# Patient Record
Sex: Male | Born: 1945 | Race: Black or African American | Hispanic: No | Marital: Married | Smoking: Current every day smoker
Health system: Southern US, Community
[De-identification: ages and names within clinical notes are randomized; demographics above are authoritative.]

## PROBLEM LIST (undated history)

## (undated) DIAGNOSIS — H9190 Unspecified hearing loss, unspecified ear: Secondary | ICD-10-CM

## (undated) DIAGNOSIS — N289 Disorder of kidney and ureter, unspecified: Secondary | ICD-10-CM

## (undated) DIAGNOSIS — E785 Hyperlipidemia, unspecified: Secondary | ICD-10-CM

## (undated) DIAGNOSIS — I10 Essential (primary) hypertension: Secondary | ICD-10-CM

## (undated) DIAGNOSIS — E119 Type 2 diabetes mellitus without complications: Secondary | ICD-10-CM

## (undated) DIAGNOSIS — I739 Peripheral vascular disease, unspecified: Secondary | ICD-10-CM

## (undated) HISTORY — DX: Peripheral vascular disease, unspecified: I73.9

## (undated) HISTORY — DX: Hyperlipidemia, unspecified: E78.5

---

## 2009-04-23 ENCOUNTER — Ambulatory Visit (HOSPITAL_COMMUNITY): Admission: RE | Admit: 2009-04-23 | Discharge: 2009-04-23 | Payer: Self-pay | Admitting: Family Medicine

## 2009-06-20 ENCOUNTER — Emergency Department (HOSPITAL_COMMUNITY): Admission: EM | Admit: 2009-06-20 | Discharge: 2009-06-20 | Payer: Self-pay | Admitting: Emergency Medicine

## 2011-01-23 LAB — COMPREHENSIVE METABOLIC PANEL
ALT: 30 U/L (ref 0–53)
Albumin: 3.5 g/dL (ref 3.5–5.2)
Alkaline Phosphatase: 87 U/L (ref 39–117)
Calcium: 9.4 mg/dL (ref 8.4–10.5)
Potassium: 3.9 mEq/L (ref 3.5–5.1)
Sodium: 138 mEq/L (ref 135–145)
Total Protein: 6.7 g/dL (ref 6.0–8.3)

## 2011-01-23 LAB — DIFFERENTIAL
Basophils Relative: 1 % (ref 0–1)
Eosinophils Absolute: 0.1 10*3/uL (ref 0.0–0.7)
Lymphs Abs: 2.3 10*3/uL (ref 0.7–4.0)
Monocytes Absolute: 0.6 10*3/uL (ref 0.1–1.0)
Monocytes Relative: 10 % (ref 3–12)
Neutro Abs: 3.3 10*3/uL (ref 1.7–7.7)

## 2011-01-23 LAB — CBC
MCHC: 36 g/dL (ref 30.0–36.0)
Platelets: 149 10*3/uL — ABNORMAL LOW (ref 150–400)
RDW: 13.6 % (ref 11.5–15.5)

## 2011-02-01 ENCOUNTER — Emergency Department (HOSPITAL_COMMUNITY)
Admission: EM | Admit: 2011-02-01 | Discharge: 2011-02-01 | Disposition: A | Discharge status: Home / Self Care | Payer: Federal, State, Local not specified - PPO | Attending: Emergency Medicine | Admitting: Emergency Medicine

## 2011-02-01 ENCOUNTER — Emergency Department (HOSPITAL_COMMUNITY): Payer: Federal, State, Local not specified - PPO

## 2011-02-01 DIAGNOSIS — R51 Headache: Secondary | ICD-10-CM | POA: Insufficient documentation

## 2011-07-21 ENCOUNTER — Ambulatory Visit (HOSPITAL_COMMUNITY)
Admission: RE | Admit: 2011-07-21 | Discharge: 2011-07-21 | Disposition: A | Discharge status: Home / Self Care | Payer: Federal, State, Local not specified - PPO | Source: Ambulatory Visit | Attending: Family Medicine | Admitting: Family Medicine

## 2011-07-21 ENCOUNTER — Other Ambulatory Visit (HOSPITAL_COMMUNITY): Payer: Self-pay | Admitting: Family Medicine

## 2011-07-21 DIAGNOSIS — M2010 Hallux valgus (acquired), unspecified foot: Secondary | ICD-10-CM | POA: Insufficient documentation

## 2011-07-21 DIAGNOSIS — R52 Pain, unspecified: Secondary | ICD-10-CM

## 2011-07-21 DIAGNOSIS — M7730 Calcaneal spur, unspecified foot: Secondary | ICD-10-CM | POA: Insufficient documentation

## 2011-07-21 DIAGNOSIS — M25579 Pain in unspecified ankle and joints of unspecified foot: Secondary | ICD-10-CM | POA: Insufficient documentation

## 2011-11-05 DIAGNOSIS — K649 Unspecified hemorrhoids: Secondary | ICD-10-CM | POA: Diagnosis not present

## 2011-11-05 DIAGNOSIS — D126 Benign neoplasm of colon, unspecified: Secondary | ICD-10-CM | POA: Diagnosis not present

## 2011-11-05 DIAGNOSIS — Z1211 Encounter for screening for malignant neoplasm of colon: Secondary | ICD-10-CM | POA: Diagnosis not present

## 2011-11-13 DIAGNOSIS — E785 Hyperlipidemia, unspecified: Secondary | ICD-10-CM | POA: Diagnosis not present

## 2011-11-13 DIAGNOSIS — I10 Essential (primary) hypertension: Secondary | ICD-10-CM | POA: Diagnosis not present

## 2012-03-11 DIAGNOSIS — E785 Hyperlipidemia, unspecified: Secondary | ICD-10-CM | POA: Diagnosis not present

## 2012-03-11 DIAGNOSIS — I10 Essential (primary) hypertension: Secondary | ICD-10-CM | POA: Diagnosis not present

## 2012-03-11 DIAGNOSIS — E119 Type 2 diabetes mellitus without complications: Secondary | ICD-10-CM | POA: Diagnosis not present

## 2012-07-13 DIAGNOSIS — Z125 Encounter for screening for malignant neoplasm of prostate: Secondary | ICD-10-CM | POA: Diagnosis not present

## 2012-07-13 DIAGNOSIS — I10 Essential (primary) hypertension: Secondary | ICD-10-CM | POA: Diagnosis not present

## 2012-07-13 DIAGNOSIS — N400 Enlarged prostate without lower urinary tract symptoms: Secondary | ICD-10-CM | POA: Diagnosis not present

## 2012-07-13 DIAGNOSIS — E785 Hyperlipidemia, unspecified: Secondary | ICD-10-CM | POA: Diagnosis not present

## 2012-08-24 DIAGNOSIS — I10 Essential (primary) hypertension: Secondary | ICD-10-CM | POA: Diagnosis not present

## 2012-08-24 DIAGNOSIS — E7800 Pure hypercholesterolemia, unspecified: Secondary | ICD-10-CM | POA: Diagnosis not present

## 2012-12-07 DIAGNOSIS — I10 Essential (primary) hypertension: Secondary | ICD-10-CM | POA: Diagnosis not present

## 2012-12-07 DIAGNOSIS — E785 Hyperlipidemia, unspecified: Secondary | ICD-10-CM | POA: Diagnosis not present

## 2013-02-23 DIAGNOSIS — J3489 Other specified disorders of nose and nasal sinuses: Secondary | ICD-10-CM | POA: Diagnosis not present

## 2013-02-23 DIAGNOSIS — H6590 Unspecified nonsuppurative otitis media, unspecified ear: Secondary | ICD-10-CM | POA: Diagnosis not present

## 2013-02-23 DIAGNOSIS — H908 Mixed conductive and sensorineural hearing loss, unspecified: Secondary | ICD-10-CM | POA: Diagnosis not present

## 2013-03-02 DIAGNOSIS — E7800 Pure hypercholesterolemia, unspecified: Secondary | ICD-10-CM | POA: Diagnosis not present

## 2013-03-02 DIAGNOSIS — H6590 Unspecified nonsuppurative otitis media, unspecified ear: Secondary | ICD-10-CM | POA: Diagnosis not present

## 2013-03-02 DIAGNOSIS — H908 Mixed conductive and sensorineural hearing loss, unspecified: Secondary | ICD-10-CM | POA: Diagnosis not present

## 2013-03-02 DIAGNOSIS — Z823 Family history of stroke: Secondary | ICD-10-CM | POA: Diagnosis not present

## 2013-03-02 DIAGNOSIS — F17200 Nicotine dependence, unspecified, uncomplicated: Secondary | ICD-10-CM | POA: Diagnosis not present

## 2013-03-02 DIAGNOSIS — H6980 Other specified disorders of Eustachian tube, unspecified ear: Secondary | ICD-10-CM | POA: Diagnosis not present

## 2013-03-02 DIAGNOSIS — H6520 Chronic serous otitis media, unspecified ear: Secondary | ICD-10-CM | POA: Diagnosis not present

## 2013-03-02 DIAGNOSIS — J3489 Other specified disorders of nose and nasal sinuses: Secondary | ICD-10-CM | POA: Diagnosis not present

## 2013-03-02 DIAGNOSIS — Z800 Family history of malignant neoplasm of digestive organs: Secondary | ICD-10-CM | POA: Diagnosis not present

## 2013-03-02 DIAGNOSIS — Z79899 Other long term (current) drug therapy: Secondary | ICD-10-CM | POA: Diagnosis not present

## 2013-03-02 DIAGNOSIS — I10 Essential (primary) hypertension: Secondary | ICD-10-CM | POA: Diagnosis not present

## 2013-03-02 DIAGNOSIS — E669 Obesity, unspecified: Secondary | ICD-10-CM | POA: Diagnosis not present

## 2013-03-02 DIAGNOSIS — Z6828 Body mass index (BMI) 28.0-28.9, adult: Secondary | ICD-10-CM | POA: Diagnosis not present

## 2013-03-02 DIAGNOSIS — Z8249 Family history of ischemic heart disease and other diseases of the circulatory system: Secondary | ICD-10-CM | POA: Diagnosis not present

## 2013-04-05 DIAGNOSIS — J3489 Other specified disorders of nose and nasal sinuses: Secondary | ICD-10-CM | POA: Diagnosis not present

## 2013-04-05 DIAGNOSIS — H908 Mixed conductive and sensorineural hearing loss, unspecified: Secondary | ICD-10-CM | POA: Diagnosis not present

## 2013-04-06 DIAGNOSIS — E7800 Pure hypercholesterolemia, unspecified: Secondary | ICD-10-CM | POA: Diagnosis not present

## 2013-04-06 DIAGNOSIS — I10 Essential (primary) hypertension: Secondary | ICD-10-CM | POA: Diagnosis not present

## 2013-07-26 DIAGNOSIS — Z125 Encounter for screening for malignant neoplasm of prostate: Secondary | ICD-10-CM | POA: Diagnosis not present

## 2013-07-26 DIAGNOSIS — Z1212 Encounter for screening for malignant neoplasm of rectum: Secondary | ICD-10-CM | POA: Diagnosis not present

## 2013-07-26 DIAGNOSIS — Z23 Encounter for immunization: Secondary | ICD-10-CM | POA: Diagnosis not present

## 2013-07-26 DIAGNOSIS — I10 Essential (primary) hypertension: Secondary | ICD-10-CM | POA: Diagnosis not present

## 2013-08-02 DIAGNOSIS — L989 Disorder of the skin and subcutaneous tissue, unspecified: Secondary | ICD-10-CM | POA: Diagnosis not present

## 2013-08-02 DIAGNOSIS — L909 Atrophic disorder of skin, unspecified: Secondary | ICD-10-CM | POA: Diagnosis not present

## 2013-08-16 DIAGNOSIS — L909 Atrophic disorder of skin, unspecified: Secondary | ICD-10-CM | POA: Diagnosis not present

## 2013-08-17 ENCOUNTER — Encounter: Payer: Self-pay | Admitting: Podiatry

## 2013-08-17 ENCOUNTER — Ambulatory Visit (INDEPENDENT_AMBULATORY_CARE_PROVIDER_SITE_OTHER): Payer: Medicare Other

## 2013-08-17 ENCOUNTER — Ambulatory Visit (INDEPENDENT_AMBULATORY_CARE_PROVIDER_SITE_OTHER): Payer: Medicare Other | Admitting: Podiatry

## 2013-08-17 VITALS — BP 117/75 | HR 69 | Temp 97.0°F | Resp 16 | Ht >= 80 in | Wt 267.0 lb

## 2013-08-17 DIAGNOSIS — M722 Plantar fascial fibromatosis: Secondary | ICD-10-CM

## 2013-08-17 DIAGNOSIS — R52 Pain, unspecified: Secondary | ICD-10-CM | POA: Diagnosis not present

## 2013-08-17 DIAGNOSIS — M7750 Other enthesopathy of unspecified foot: Secondary | ICD-10-CM | POA: Diagnosis not present

## 2013-08-17 DIAGNOSIS — M778 Other enthesopathies, not elsewhere classified: Secondary | ICD-10-CM

## 2013-08-17 NOTE — Progress Notes (Signed)
N BALLED UP SOCK FEELING  L BOTH FEET BALLS OF FEET , RIGHT IS WORSE  D 5 YEARS  O GRADUAL   C WORSE  A WHEN WALKING BAREFOOT OR WITH SHOES ON  T NO TREATMENT

## 2013-08-17 NOTE — Progress Notes (Signed)
[place] presents today as a 67 year old black male with a chief complaint of a 5 year duration of numbness beneath second metatarsophalangeal joint both feet. 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  Objective: Vital signs are stable he is alert and oriented x3. I have reviewed his past medical history medications and allergies. As well as his review of systems. Bilateral  lower extremity exam reveals strongly palpable pulses bilateral. No loss of neurologic sensorium per Semmes-Weinstein monofilament. Deep tendon reflexes are intact and brisk bilateral. Muscle strength +5 over 5 dorsiflexors plantar flexors inverters and evertors. All intrinsic musculature is intact. 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 Radiographic evaluation confirms this. There are no reactive hyperkeratosis noted. He does have tenderness on palpation of the medial continued tubercles bilateral.  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 This contraction is resulting in plantarflexion of the second metatarsal and reactive inflammation about the second metatarsophalangeal joint.  Plan: We discussed the etiology pathology conservative versus surgical therapies. We injected para-articular only today the second metatarsophalangeal joints with Kenalog and local anesthetic bilaterally. I had him scan for orthotics today. We discussed appropriate shoe gear and ice therapy. Followup with him with his orthotics committed.

## 2013-09-05 ENCOUNTER — Telehealth: Payer: Self-pay | Admitting: *Deleted

## 2013-09-05 NOTE — Telephone Encounter (Signed)
Pt left message with only his name,DOB and [phone] only.

## 2013-09-08 ENCOUNTER — Ambulatory Visit (INDEPENDENT_AMBULATORY_CARE_PROVIDER_SITE_OTHER): Payer: Medicare Other | Admitting: *Deleted

## 2013-09-08 DIAGNOSIS — M722 Plantar fascial fibromatosis: Secondary | ICD-10-CM

## 2013-09-08 NOTE — Patient Instructions (Signed)
If problems with orthotics before scheduled follow up, please call us.    WEARING INSTRUCTIONS FOR ORTHOTICS  Don't expect to be comfortable wearing your orthotic devices for the first time.  Like eyeglasses, you may be aware of them as time passes, they will not be uncomfortable and you will enjoy wearing them.  FOLLOW THESE INSTRUCTIONS EXACTLY!  1. Wear your orthotic devices for:       Not more than 1 hour the first day.       Not more than 2 hours the second day.       Not more than 3 hours the third day and so on.        Or wear them for as long as they feel comfortable.       If you experience discomfort in your feet or legs take them out.  When feet & legs feel       better, put them back in.  You do need to be consistent and wear them a little        everyday. 2.   If at any time the orthotic devices become acutely uncomfortable before the       time for that particular day, STOP WEARING THEM. 3.   On the next day, do not increase the wearing time. 4.   Subsequently, increase the wearing time by 15-30 minutes only if comfortable to do       so. 5.   You will be seen by your doctor about 2-4 weeks after you receive your orthotic       devices, at which time you will probably be wearing your devices comfortably        for about 8 hours or more a day. 6.   Some patients occasionally report mild aches or discomfort in other parts of the of       body such as the knees, hips or back after 3 or 4 consecutive hours of wear.  If this       is the case with you, do not extend your wearing time.  Instead, cut it back an hour or       two.  In all likelihood, these symptoms will disappear in a short period of time as your       body posture realigns itself and functions more efficiently. 7.   It is possible that your orthotic device may require some small changes or adjustment       to improve their function or make them more comfortable.   This is usually not done       before one to  three months have elapsed.  These adjustments are made in        accordance with the changed position your feet are assuming as a result of       improved biomechanical function. 8.   In women's shoes, it's not unusual for your heel to slip out of the shoe, particularly if       they are step-in-shoes.  If this is the case, try other shoes or other styles.  Try to       purchase shoes which have deeper heal seats or higher heel counters. 9.   Squeaking of orthotics devices in the shoes is due to the movement of the devices       when they are functioning normally.  To eliminate squeaking, simply dust some       baby powder into your shoes  before inserting the devices.  If this does not work,        apply soap or wax to the edges of the orthotic devices or put a tissue into the shoes. 10. It is important that you follow these directions explicitly.  Failure to do so will simply       prolong the adjustment period or create problems which are easily avoided.  It makes       no difference if you are wearing your orthotic devices for only a few hours after        several months, so long as you are wearing them comfortably for those hours. 11. If you have any questions or complaints, contact our office.  We have no way of       knowing about your problems unless you tell us.  If we do not hear from you, we will       assume that you are proceeding well.

## 2013-09-08 NOTE — Progress Notes (Signed)
Dispensed patient's orthotics with oral and written instructions for wearing. Patient will follow up with Dr. Hyatt in 1 month for an orthotic check. 

## 2013-10-05 ENCOUNTER — Ambulatory Visit (INDEPENDENT_AMBULATORY_CARE_PROVIDER_SITE_OTHER): Payer: Medicare Other | Admitting: Podiatry

## 2013-10-05 ENCOUNTER — Encounter: Payer: Self-pay | Admitting: Podiatry

## 2013-10-05 VITALS — BP 117/71 | HR 86 | Resp 18

## 2013-10-05 DIAGNOSIS — M79609 Pain in unspecified limb: Secondary | ICD-10-CM | POA: Diagnosis not present

## 2013-10-05 DIAGNOSIS — B351 Tinea unguium: Secondary | ICD-10-CM | POA: Diagnosis not present

## 2013-10-05 DIAGNOSIS — H6690 Otitis media, unspecified, unspecified ear: Secondary | ICD-10-CM | POA: Diagnosis not present

## 2013-10-05 NOTE — Progress Notes (Signed)
   Subjective:    Patient ID: Jorge Jefferson, male    DOB: May 25, 1946, 67 y.o.   MRN: [id number]  HPI Comments: i think he is going to cut these nails and follow up on orthotics, still trying to get use to them, they are  Different      Review of Systems     Objective:   Physical Exam: Pulses are strongly palpable bilateral. Nails are thick yellow dystrophic clinically mycotic and painful palpation as well as debridement.        Assessment & Plan:  Assessment: Pain in limb secondary to onychomycosis 1 through 5 bilateral. Orthotics appear to be working well with no lesions.  Plan: I will followup with him in 3 months for a nail debridement. Debridement nails 1 through 5 bilateral is cover service today.

## 2013-11-01 DIAGNOSIS — E781 Pure hyperglyceridemia: Secondary | ICD-10-CM | POA: Diagnosis not present

## 2013-11-01 DIAGNOSIS — J069 Acute upper respiratory infection, unspecified: Secondary | ICD-10-CM | POA: Diagnosis not present

## 2013-11-01 DIAGNOSIS — E785 Hyperlipidemia, unspecified: Secondary | ICD-10-CM | POA: Diagnosis not present

## 2013-11-01 DIAGNOSIS — H6690 Otitis media, unspecified, unspecified ear: Secondary | ICD-10-CM | POA: Diagnosis not present

## 2013-11-01 DIAGNOSIS — J310 Chronic rhinitis: Secondary | ICD-10-CM | POA: Diagnosis not present

## 2013-11-01 DIAGNOSIS — B9789 Other viral agents as the cause of diseases classified elsewhere: Secondary | ICD-10-CM | POA: Diagnosis not present

## 2013-11-01 DIAGNOSIS — J3489 Other specified disorders of nose and nasal sinuses: Secondary | ICD-10-CM | POA: Diagnosis not present

## 2013-11-13 DIAGNOSIS — B9789 Other viral agents as the cause of diseases classified elsewhere: Secondary | ICD-10-CM | POA: Diagnosis not present

## 2013-12-07 ENCOUNTER — Ambulatory Visit (INDEPENDENT_AMBULATORY_CARE_PROVIDER_SITE_OTHER): Payer: Medicare Other | Admitting: Podiatry

## 2013-12-07 ENCOUNTER — Encounter: Payer: Self-pay | Admitting: Podiatry

## 2013-12-07 VITALS — BP 132/86 | HR 99 | Resp 12

## 2013-12-07 DIAGNOSIS — B351 Tinea unguium: Secondary | ICD-10-CM | POA: Diagnosis not present

## 2013-12-07 DIAGNOSIS — M79609 Pain in unspecified limb: Secondary | ICD-10-CM | POA: Diagnosis not present

## 2013-12-07 NOTE — Progress Notes (Signed)
He presents today with a chief complaint of painful E. elongated toenails he like to have them cut.  Objective: Vital signs are stable he is alert and oriented x3. Pulses are palpable bilateral. Nails are thick yellow dystrophic clinically mycotic and painful palpation.  Assessment: Pain in limb secondary to onychomycosis 1 through 5 bilateral.  Plan debridement nails 1-5 bilateral covered service secondary to pain.

## 2014-01-03 DIAGNOSIS — J3489 Other specified disorders of nose and nasal sinuses: Secondary | ICD-10-CM | POA: Diagnosis not present

## 2014-01-03 DIAGNOSIS — H6690 Otitis media, unspecified, unspecified ear: Secondary | ICD-10-CM | POA: Diagnosis not present

## 2014-01-04 DIAGNOSIS — H6690 Otitis media, unspecified, unspecified ear: Secondary | ICD-10-CM | POA: Diagnosis not present

## 2014-02-07 DIAGNOSIS — H60399 Other infective otitis externa, unspecified ear: Secondary | ICD-10-CM | POA: Diagnosis not present

## 2014-02-07 DIAGNOSIS — E785 Hyperlipidemia, unspecified: Secondary | ICD-10-CM | POA: Diagnosis not present

## 2014-02-07 DIAGNOSIS — I10 Essential (primary) hypertension: Secondary | ICD-10-CM | POA: Diagnosis not present

## 2014-02-14 DIAGNOSIS — I10 Essential (primary) hypertension: Secondary | ICD-10-CM | POA: Diagnosis not present

## 2014-03-08 ENCOUNTER — Encounter: Payer: Self-pay | Admitting: Podiatry

## 2014-03-08 ENCOUNTER — Ambulatory Visit (INDEPENDENT_AMBULATORY_CARE_PROVIDER_SITE_OTHER): Payer: Medicare Other | Admitting: Podiatry

## 2014-03-08 VITALS — BP 119/66 | HR 100 | Resp 12

## 2014-03-08 DIAGNOSIS — M79609 Pain in unspecified limb: Secondary | ICD-10-CM

## 2014-03-08 DIAGNOSIS — B351 Tinea unguium: Secondary | ICD-10-CM

## 2014-03-08 NOTE — Progress Notes (Signed)
Presents today with a chief complaint of painful elongated toenails one through 5 bilateral.  Objective: Pulses are palpable nails are thick yellow dystrophic onychomycotic and painful palpation.  Assessment: Pain in limb secondary to onychomycosis 1 through 5 bilateral.  Plan: Debridement of nails 1 through 5 bilateral covered service secondary to pain.

## 2014-03-14 DIAGNOSIS — I10 Essential (primary) hypertension: Secondary | ICD-10-CM | POA: Diagnosis not present

## 2014-03-15 DIAGNOSIS — I10 Essential (primary) hypertension: Secondary | ICD-10-CM | POA: Insufficient documentation

## 2014-03-15 DIAGNOSIS — H903 Sensorineural hearing loss, bilateral: Secondary | ICD-10-CM | POA: Insufficient documentation

## 2014-03-15 DIAGNOSIS — F17200 Nicotine dependence, unspecified, uncomplicated: Secondary | ICD-10-CM | POA: Diagnosis not present

## 2014-03-15 DIAGNOSIS — H6980 Other specified disorders of Eustachian tube, unspecified ear: Secondary | ICD-10-CM | POA: Diagnosis not present

## 2014-03-15 DIAGNOSIS — H906 Mixed conductive and sensorineural hearing loss, bilateral: Secondary | ICD-10-CM | POA: Diagnosis not present

## 2014-03-15 DIAGNOSIS — H9071 Mixed conductive and sensorineural hearing loss, unilateral, right ear, with unrestricted hearing on the contralateral side: Secondary | ICD-10-CM | POA: Insufficient documentation

## 2014-03-15 DIAGNOSIS — J3489 Other specified disorders of nose and nasal sinuses: Secondary | ICD-10-CM | POA: Insufficient documentation

## 2014-03-15 DIAGNOSIS — H908 Mixed conductive and sensorineural hearing loss, unspecified: Secondary | ICD-10-CM | POA: Diagnosis not present

## 2014-04-23 DIAGNOSIS — L989 Disorder of the skin and subcutaneous tissue, unspecified: Secondary | ICD-10-CM | POA: Diagnosis not present

## 2014-04-23 DIAGNOSIS — L919 Hypertrophic disorder of the skin, unspecified: Secondary | ICD-10-CM | POA: Diagnosis not present

## 2014-04-23 DIAGNOSIS — L909 Atrophic disorder of skin, unspecified: Secondary | ICD-10-CM | POA: Diagnosis not present

## 2014-05-02 DIAGNOSIS — I10 Essential (primary) hypertension: Secondary | ICD-10-CM | POA: Diagnosis not present

## 2014-06-14 ENCOUNTER — Ambulatory Visit (INDEPENDENT_AMBULATORY_CARE_PROVIDER_SITE_OTHER): Payer: Medicare Other | Admitting: Podiatry

## 2014-06-14 ENCOUNTER — Encounter: Payer: Self-pay | Admitting: Podiatry

## 2014-06-14 DIAGNOSIS — M79609 Pain in unspecified limb: Secondary | ICD-10-CM | POA: Diagnosis not present

## 2014-06-14 DIAGNOSIS — B351 Tinea unguium: Secondary | ICD-10-CM | POA: Diagnosis not present

## 2014-06-14 DIAGNOSIS — M79676 Pain in unspecified toe(s): Secondary | ICD-10-CM

## 2014-06-14 NOTE — Progress Notes (Signed)
He presents today chief complaint of painful elongated toenails one through 5 bilateral.  Objective: Pulses are strongly palpable bilateral. Nails are thick yellow dystrophic with mycotic and painful palpation.  Assessment: Pain in limb secondary to onychomycosis 1 through 5 bilateral.  Plan: Debridement of nails 1 through 5 bilateral covered service secondary to pain.

## 2014-07-11 DIAGNOSIS — I10 Essential (primary) hypertension: Secondary | ICD-10-CM | POA: Diagnosis not present

## 2014-07-11 DIAGNOSIS — Z23 Encounter for immunization: Secondary | ICD-10-CM | POA: Diagnosis not present

## 2014-08-09 ENCOUNTER — Encounter: Payer: Self-pay | Admitting: Podiatry

## 2014-08-09 ENCOUNTER — Ambulatory Visit: Payer: Medicare Other | Admitting: Podiatry

## 2014-08-09 VITALS — BP 133/78 | HR 85 | Resp 16

## 2014-08-09 DIAGNOSIS — L600 Ingrowing nail: Secondary | ICD-10-CM | POA: Diagnosis not present

## 2014-08-09 MED ORDER — NEOMYCIN-POLYMYXIN-HC 3.5-10000-1 OT SOLN: OTIC | Status: DC

## 2014-08-09 NOTE — Progress Notes (Signed)
He presents today with a chief complaint of painful ingrown toenail to the hallux left. He states this been bothering him for quite some time now and is ready to have it removed at all possible.  Objective: Vital signs are stable he is alert and oriented x3 pulses are palpable to the left foot. The tibial border of the hallux nail left and sharply incurvated and painful on palpation. There is mild erythema no edema cellulitis drainage or odor.  Assessment ingrown toenail hallux left.  Plan: Matrixectomy hallux left was performed after 3 cc of a 50-50 mixture of Marcaine plain and lidocaine plain were infiltrated in a hallux block left. The nail was split from distal to proximal after being prepped in is normal sterile fashion. The nail was removed from the matrix exposing the matrix and nailbed was then phenolized and then neutralized last couple alcohol. Dry sterile compressive dressing was applied. He was given both oral and written home-going instructions for the care of his foot. I will followup with him in one week.

## 2014-08-09 NOTE — Patient Instructions (Signed)

## 2014-08-16 ENCOUNTER — Encounter: Payer: Self-pay | Admitting: Podiatry

## 2014-08-16 ENCOUNTER — Ambulatory Visit (INDEPENDENT_AMBULATORY_CARE_PROVIDER_SITE_OTHER): Payer: Medicare Other | Admitting: Podiatry

## 2014-08-16 VITALS — BP 149/89 | HR 95 | Resp 14

## 2014-08-16 DIAGNOSIS — L600 Ingrowing nail: Secondary | ICD-10-CM

## 2014-08-16 NOTE — Progress Notes (Signed)
He presents today for follow-up of his matrixectomy tibial border hallux left. States that it feels much better than it did prior to surgery. He continues to soak twice daily.  Objective: Vital signs are stable he is alert and oriented 3 pulses left foot are intact. Margin of the hallux tibial border demonstrates well-healing surgical toe.  Assessment: Well-healing surgical toe hallux left.  Plan: Discontinue Betadine so with Epsom salts and warm water soaks covering the daily Lopid and night. Continue second toe completely well follow up with me as needed for routine nail debridement.

## 2014-09-05 DIAGNOSIS — I10 Essential (primary) hypertension: Secondary | ICD-10-CM | POA: Diagnosis not present

## 2014-09-05 DIAGNOSIS — E785 Hyperlipidemia, unspecified: Secondary | ICD-10-CM | POA: Diagnosis not present

## 2014-09-13 DIAGNOSIS — R0989 Other specified symptoms and signs involving the circulatory and respiratory systems: Secondary | ICD-10-CM | POA: Diagnosis not present

## 2014-09-13 DIAGNOSIS — R571 Hypovolemic shock: Secondary | ICD-10-CM | POA: Diagnosis not present

## 2014-09-13 DIAGNOSIS — M542 Cervicalgia: Secondary | ICD-10-CM | POA: Diagnosis not present

## 2014-09-13 DIAGNOSIS — R201 Hypoesthesia of skin: Secondary | ICD-10-CM | POA: Diagnosis not present

## 2014-09-13 DIAGNOSIS — I2510 Atherosclerotic heart disease of native coronary artery without angina pectoris: Secondary | ICD-10-CM | POA: Diagnosis not present

## 2014-09-13 DIAGNOSIS — E872 Acidosis: Secondary | ICD-10-CM | POA: Diagnosis not present

## 2014-09-13 DIAGNOSIS — K5730 Diverticulosis of large intestine without perforation or abscess without bleeding: Secondary | ICD-10-CM | POA: Diagnosis not present

## 2014-09-13 DIAGNOSIS — R031 Nonspecific low blood-pressure reading: Secondary | ICD-10-CM | POA: Diagnosis not present

## 2014-09-13 DIAGNOSIS — D696 Thrombocytopenia, unspecified: Secondary | ICD-10-CM | POA: Diagnosis not present

## 2014-09-13 DIAGNOSIS — I959 Hypotension, unspecified: Secondary | ICD-10-CM | POA: Diagnosis not present

## 2014-09-13 DIAGNOSIS — N179 Acute kidney failure, unspecified: Secondary | ICD-10-CM | POA: Diagnosis not present

## 2014-09-13 DIAGNOSIS — R6510 Systemic inflammatory response syndrome (SIRS) of non-infectious origin without acute organ dysfunction: Secondary | ICD-10-CM | POA: Diagnosis not present

## 2014-09-13 DIAGNOSIS — I517 Cardiomegaly: Secondary | ICD-10-CM | POA: Diagnosis not present

## 2014-09-13 DIAGNOSIS — R4182 Altered mental status, unspecified: Secondary | ICD-10-CM | POA: Diagnosis not present

## 2014-09-13 DIAGNOSIS — M6282 Rhabdomyolysis: Secondary | ICD-10-CM | POA: Diagnosis not present

## 2014-09-14 DIAGNOSIS — N179 Acute kidney failure, unspecified: Secondary | ICD-10-CM | POA: Diagnosis not present

## 2014-09-14 DIAGNOSIS — I340 Nonrheumatic mitral (valve) insufficiency: Secondary | ICD-10-CM | POA: Diagnosis not present

## 2014-09-14 DIAGNOSIS — N170 Acute kidney failure with tubular necrosis: Secondary | ICD-10-CM | POA: Diagnosis not present

## 2014-09-14 DIAGNOSIS — M79604 Pain in right leg: Secondary | ICD-10-CM | POA: Diagnosis not present

## 2014-09-14 DIAGNOSIS — I959 Hypotension, unspecified: Secondary | ICD-10-CM | POA: Diagnosis not present

## 2014-09-14 DIAGNOSIS — M542 Cervicalgia: Secondary | ICD-10-CM | POA: Diagnosis not present

## 2014-09-14 DIAGNOSIS — N3289 Other specified disorders of bladder: Secondary | ICD-10-CM | POA: Diagnosis not present

## 2014-09-14 DIAGNOSIS — M6282 Rhabdomyolysis: Secondary | ICD-10-CM | POA: Diagnosis not present

## 2014-09-14 DIAGNOSIS — Z0389 Encounter for observation for other suspected diseases and conditions ruled out: Secondary | ICD-10-CM | POA: Diagnosis not present

## 2014-09-14 DIAGNOSIS — J9811 Atelectasis: Secondary | ICD-10-CM | POA: Diagnosis not present

## 2014-09-14 DIAGNOSIS — I739 Peripheral vascular disease, unspecified: Secondary | ICD-10-CM | POA: Diagnosis not present

## 2014-09-14 DIAGNOSIS — N281 Cyst of kidney, acquired: Secondary | ICD-10-CM | POA: Diagnosis not present

## 2014-09-14 DIAGNOSIS — R571 Hypovolemic shock: Secondary | ICD-10-CM | POA: Diagnosis not present

## 2014-09-14 DIAGNOSIS — R7889 Finding of other specified substances, not normally found in blood: Secondary | ICD-10-CM | POA: Diagnosis not present

## 2014-09-14 DIAGNOSIS — N189 Chronic kidney disease, unspecified: Secondary | ICD-10-CM | POA: Diagnosis not present

## 2014-09-14 DIAGNOSIS — R778 Other specified abnormalities of plasma proteins: Secondary | ICD-10-CM | POA: Diagnosis not present

## 2014-09-14 DIAGNOSIS — B1920 Unspecified viral hepatitis C without hepatic coma: Secondary | ICD-10-CM | POA: Diagnosis not present

## 2014-09-14 DIAGNOSIS — R7989 Other specified abnormal findings of blood chemistry: Secondary | ICD-10-CM | POA: Diagnosis not present

## 2014-09-14 DIAGNOSIS — M79605 Pain in left leg: Secondary | ICD-10-CM | POA: Diagnosis not present

## 2014-09-14 DIAGNOSIS — R945 Abnormal results of liver function studies: Secondary | ICD-10-CM | POA: Diagnosis not present

## 2014-09-14 DIAGNOSIS — E780 Pure hypercholesterolemia: Secondary | ICD-10-CM | POA: Diagnosis not present

## 2014-09-14 DIAGNOSIS — J910 Malignant pleural effusion: Secondary | ICD-10-CM | POA: Diagnosis not present

## 2014-09-14 DIAGNOSIS — Z452 Encounter for adjustment and management of vascular access device: Secondary | ICD-10-CM | POA: Diagnosis not present

## 2014-09-14 DIAGNOSIS — E872 Acidosis: Secondary | ICD-10-CM | POA: Diagnosis not present

## 2014-09-15 DIAGNOSIS — R571 Hypovolemic shock: Secondary | ICD-10-CM | POA: Diagnosis not present

## 2014-09-15 DIAGNOSIS — J910 Malignant pleural effusion: Secondary | ICD-10-CM | POA: Diagnosis not present

## 2014-09-15 DIAGNOSIS — I340 Nonrheumatic mitral (valve) insufficiency: Secondary | ICD-10-CM | POA: Diagnosis not present

## 2014-09-15 DIAGNOSIS — M6282 Rhabdomyolysis: Secondary | ICD-10-CM | POA: Diagnosis not present

## 2014-09-15 DIAGNOSIS — E780 Pure hypercholesterolemia: Secondary | ICD-10-CM | POA: Diagnosis not present

## 2014-09-15 DIAGNOSIS — E872 Acidosis: Secondary | ICD-10-CM | POA: Diagnosis not present

## 2014-09-15 DIAGNOSIS — R778 Other specified abnormalities of plasma proteins: Secondary | ICD-10-CM | POA: Diagnosis not present

## 2014-09-15 DIAGNOSIS — I959 Hypotension, unspecified: Secondary | ICD-10-CM | POA: Diagnosis not present

## 2014-09-15 DIAGNOSIS — R7889 Finding of other specified substances, not normally found in blood: Secondary | ICD-10-CM | POA: Diagnosis not present

## 2014-09-15 DIAGNOSIS — N179 Acute kidney failure, unspecified: Secondary | ICD-10-CM | POA: Diagnosis not present

## 2014-09-15 DIAGNOSIS — R7989 Other specified abnormal findings of blood chemistry: Secondary | ICD-10-CM | POA: Diagnosis not present

## 2014-09-15 DIAGNOSIS — N170 Acute kidney failure with tubular necrosis: Secondary | ICD-10-CM | POA: Diagnosis not present

## 2014-09-15 DIAGNOSIS — B1920 Unspecified viral hepatitis C without hepatic coma: Secondary | ICD-10-CM | POA: Diagnosis not present

## 2014-09-16 DIAGNOSIS — R7989 Other specified abnormal findings of blood chemistry: Secondary | ICD-10-CM | POA: Diagnosis not present

## 2014-09-16 DIAGNOSIS — R778 Other specified abnormalities of plasma proteins: Secondary | ICD-10-CM | POA: Diagnosis not present

## 2014-09-16 DIAGNOSIS — N170 Acute kidney failure with tubular necrosis: Secondary | ICD-10-CM | POA: Diagnosis not present

## 2014-09-16 DIAGNOSIS — J910 Malignant pleural effusion: Secondary | ICD-10-CM | POA: Diagnosis not present

## 2014-09-16 DIAGNOSIS — E780 Pure hypercholesterolemia: Secondary | ICD-10-CM | POA: Diagnosis not present

## 2014-09-16 DIAGNOSIS — M6282 Rhabdomyolysis: Secondary | ICD-10-CM | POA: Diagnosis not present

## 2014-09-16 DIAGNOSIS — I340 Nonrheumatic mitral (valve) insufficiency: Secondary | ICD-10-CM | POA: Diagnosis not present

## 2014-09-16 DIAGNOSIS — R571 Hypovolemic shock: Secondary | ICD-10-CM | POA: Diagnosis not present

## 2014-09-16 DIAGNOSIS — I959 Hypotension, unspecified: Secondary | ICD-10-CM | POA: Diagnosis not present

## 2014-09-16 DIAGNOSIS — B1920 Unspecified viral hepatitis C without hepatic coma: Secondary | ICD-10-CM | POA: Diagnosis not present

## 2014-09-16 DIAGNOSIS — E872 Acidosis: Secondary | ICD-10-CM | POA: Diagnosis not present

## 2014-09-16 DIAGNOSIS — N179 Acute kidney failure, unspecified: Secondary | ICD-10-CM | POA: Diagnosis not present

## 2014-09-16 DIAGNOSIS — R7889 Finding of other specified substances, not normally found in blood: Secondary | ICD-10-CM | POA: Diagnosis not present

## 2014-09-17 DIAGNOSIS — N170 Acute kidney failure with tubular necrosis: Secondary | ICD-10-CM | POA: Diagnosis not present

## 2014-09-17 DIAGNOSIS — R571 Hypovolemic shock: Secondary | ICD-10-CM | POA: Diagnosis not present

## 2014-09-17 DIAGNOSIS — R7989 Other specified abnormal findings of blood chemistry: Secondary | ICD-10-CM | POA: Diagnosis not present

## 2014-09-17 DIAGNOSIS — E872 Acidosis: Secondary | ICD-10-CM | POA: Diagnosis not present

## 2014-09-17 DIAGNOSIS — I959 Hypotension, unspecified: Secondary | ICD-10-CM | POA: Diagnosis not present

## 2014-09-17 DIAGNOSIS — N179 Acute kidney failure, unspecified: Secondary | ICD-10-CM | POA: Diagnosis not present

## 2014-09-17 DIAGNOSIS — E780 Pure hypercholesterolemia: Secondary | ICD-10-CM | POA: Diagnosis not present

## 2014-09-17 DIAGNOSIS — I340 Nonrheumatic mitral (valve) insufficiency: Secondary | ICD-10-CM | POA: Diagnosis not present

## 2014-09-17 DIAGNOSIS — R778 Other specified abnormalities of plasma proteins: Secondary | ICD-10-CM | POA: Diagnosis not present

## 2014-09-17 DIAGNOSIS — R7889 Finding of other specified substances, not normally found in blood: Secondary | ICD-10-CM | POA: Diagnosis not present

## 2014-09-17 DIAGNOSIS — M6282 Rhabdomyolysis: Secondary | ICD-10-CM | POA: Diagnosis not present

## 2014-09-17 DIAGNOSIS — B1920 Unspecified viral hepatitis C without hepatic coma: Secondary | ICD-10-CM | POA: Diagnosis not present

## 2014-09-17 DIAGNOSIS — J910 Malignant pleural effusion: Secondary | ICD-10-CM | POA: Diagnosis not present

## 2014-09-18 DIAGNOSIS — N179 Acute kidney failure, unspecified: Secondary | ICD-10-CM | POA: Diagnosis not present

## 2014-09-18 DIAGNOSIS — N170 Acute kidney failure with tubular necrosis: Secondary | ICD-10-CM | POA: Diagnosis not present

## 2014-09-18 DIAGNOSIS — I959 Hypotension, unspecified: Secondary | ICD-10-CM | POA: Diagnosis not present

## 2014-09-18 DIAGNOSIS — M6282 Rhabdomyolysis: Secondary | ICD-10-CM | POA: Diagnosis not present

## 2014-09-18 DIAGNOSIS — E780 Pure hypercholesterolemia: Secondary | ICD-10-CM | POA: Diagnosis not present

## 2014-09-18 DIAGNOSIS — R7889 Finding of other specified substances, not normally found in blood: Secondary | ICD-10-CM | POA: Diagnosis not present

## 2014-09-18 DIAGNOSIS — J910 Malignant pleural effusion: Secondary | ICD-10-CM | POA: Diagnosis not present

## 2014-09-18 DIAGNOSIS — E872 Acidosis: Secondary | ICD-10-CM | POA: Diagnosis not present

## 2014-09-18 DIAGNOSIS — I340 Nonrheumatic mitral (valve) insufficiency: Secondary | ICD-10-CM | POA: Diagnosis not present

## 2014-09-18 DIAGNOSIS — B1920 Unspecified viral hepatitis C without hepatic coma: Secondary | ICD-10-CM | POA: Diagnosis not present

## 2014-09-18 DIAGNOSIS — R571 Hypovolemic shock: Secondary | ICD-10-CM | POA: Diagnosis not present

## 2014-09-19 DIAGNOSIS — N179 Acute kidney failure, unspecified: Secondary | ICD-10-CM | POA: Diagnosis not present

## 2014-09-19 DIAGNOSIS — J910 Malignant pleural effusion: Secondary | ICD-10-CM | POA: Diagnosis not present

## 2014-09-19 DIAGNOSIS — R778 Other specified abnormalities of plasma proteins: Secondary | ICD-10-CM | POA: Diagnosis not present

## 2014-09-19 DIAGNOSIS — N170 Acute kidney failure with tubular necrosis: Secondary | ICD-10-CM | POA: Diagnosis not present

## 2014-09-19 DIAGNOSIS — B1920 Unspecified viral hepatitis C without hepatic coma: Secondary | ICD-10-CM | POA: Diagnosis not present

## 2014-09-19 DIAGNOSIS — E780 Pure hypercholesterolemia: Secondary | ICD-10-CM | POA: Diagnosis not present

## 2014-09-19 DIAGNOSIS — I340 Nonrheumatic mitral (valve) insufficiency: Secondary | ICD-10-CM | POA: Diagnosis not present

## 2014-09-19 DIAGNOSIS — R571 Hypovolemic shock: Secondary | ICD-10-CM | POA: Diagnosis not present

## 2014-09-19 DIAGNOSIS — E872 Acidosis: Secondary | ICD-10-CM | POA: Diagnosis not present

## 2014-09-19 DIAGNOSIS — M6282 Rhabdomyolysis: Secondary | ICD-10-CM | POA: Diagnosis not present

## 2014-09-19 DIAGNOSIS — R7889 Finding of other specified substances, not normally found in blood: Secondary | ICD-10-CM | POA: Diagnosis not present

## 2014-09-20 ENCOUNTER — Ambulatory Visit: Payer: Medicare Other | Admitting: Podiatry

## 2014-09-20 DIAGNOSIS — R778 Other specified abnormalities of plasma proteins: Secondary | ICD-10-CM | POA: Diagnosis not present

## 2014-09-20 DIAGNOSIS — B1920 Unspecified viral hepatitis C without hepatic coma: Secondary | ICD-10-CM | POA: Diagnosis not present

## 2014-09-21 DIAGNOSIS — B1920 Unspecified viral hepatitis C without hepatic coma: Secondary | ICD-10-CM | POA: Diagnosis not present

## 2014-09-21 DIAGNOSIS — N179 Acute kidney failure, unspecified: Secondary | ICD-10-CM | POA: Diagnosis not present

## 2014-09-21 DIAGNOSIS — R778 Other specified abnormalities of plasma proteins: Secondary | ICD-10-CM | POA: Diagnosis not present

## 2014-09-21 DIAGNOSIS — M6282 Rhabdomyolysis: Secondary | ICD-10-CM | POA: Diagnosis not present

## 2014-09-24 DIAGNOSIS — N19 Unspecified kidney failure: Secondary | ICD-10-CM | POA: Diagnosis not present

## 2014-09-24 DIAGNOSIS — M6282 Rhabdomyolysis: Secondary | ICD-10-CM | POA: Diagnosis not present

## 2014-09-24 DIAGNOSIS — Z79899 Other long term (current) drug therapy: Secondary | ICD-10-CM | POA: Diagnosis not present

## 2014-09-24 DIAGNOSIS — E785 Hyperlipidemia, unspecified: Secondary | ICD-10-CM | POA: Diagnosis not present

## 2014-09-24 DIAGNOSIS — Z008 Encounter for other general examination: Secondary | ICD-10-CM | POA: Diagnosis not present

## 2014-10-04 ENCOUNTER — Ambulatory Visit (INDEPENDENT_AMBULATORY_CARE_PROVIDER_SITE_OTHER): Payer: Medicare Other | Admitting: Podiatry

## 2014-10-04 DIAGNOSIS — B351 Tinea unguium: Secondary | ICD-10-CM | POA: Diagnosis not present

## 2014-10-04 DIAGNOSIS — M79673 Pain in unspecified foot: Secondary | ICD-10-CM | POA: Diagnosis not present

## 2014-10-04 NOTE — Progress Notes (Signed)
He presents today chief complaint of painful elongated toenails one through 5 bilateral.  Objective: Pulses are strongly palpable bilateral. Nails are thick yellow dystrophic with mycotic and painful palpation.  Assessment: Pain in limb secondary to onychomycosis 1 through 5 bilateral.  Plan: Debridement of nails 1 through 5 bilateral covered service secondary to pain.

## 2014-10-10 DIAGNOSIS — Z79899 Other long term (current) drug therapy: Secondary | ICD-10-CM | POA: Diagnosis not present

## 2014-10-10 DIAGNOSIS — N19 Unspecified kidney failure: Secondary | ICD-10-CM | POA: Diagnosis not present

## 2014-10-10 DIAGNOSIS — E785 Hyperlipidemia, unspecified: Secondary | ICD-10-CM | POA: Diagnosis not present

## 2014-11-14 DIAGNOSIS — I10 Essential (primary) hypertension: Secondary | ICD-10-CM | POA: Diagnosis not present

## 2014-11-14 DIAGNOSIS — Z1211 Encounter for screening for malignant neoplasm of colon: Secondary | ICD-10-CM | POA: Diagnosis not present

## 2014-11-14 DIAGNOSIS — Z86010 Personal history of colonic polyps: Secondary | ICD-10-CM | POA: Diagnosis not present

## 2014-11-26 DIAGNOSIS — Z79899 Other long term (current) drug therapy: Secondary | ICD-10-CM | POA: Diagnosis not present

## 2014-11-26 DIAGNOSIS — I10 Essential (primary) hypertension: Secondary | ICD-10-CM | POA: Diagnosis not present

## 2014-11-30 ENCOUNTER — Encounter (HOSPITAL_COMMUNITY): Payer: Self-pay | Admitting: Emergency Medicine

## 2014-11-30 ENCOUNTER — Emergency Department (HOSPITAL_COMMUNITY)
Admission: EM | Admit: 2014-11-30 | Discharge: 2014-11-30 | Disposition: A | Discharge status: Home / Self Care | Payer: Medicare Other | Attending: Emergency Medicine | Admitting: Emergency Medicine

## 2014-11-30 ENCOUNTER — Emergency Department (HOSPITAL_COMMUNITY): Payer: Medicare Other

## 2014-11-30 DIAGNOSIS — M25472 Effusion, left ankle: Secondary | ICD-10-CM | POA: Diagnosis not present

## 2014-11-30 DIAGNOSIS — M25572 Pain in left ankle and joints of left foot: Secondary | ICD-10-CM | POA: Insufficient documentation

## 2014-11-30 DIAGNOSIS — I10 Essential (primary) hypertension: Secondary | ICD-10-CM | POA: Diagnosis not present

## 2014-11-30 DIAGNOSIS — Z720 Tobacco use: Secondary | ICD-10-CM | POA: Diagnosis not present

## 2014-11-30 DIAGNOSIS — L989 Disorder of the skin and subcutaneous tissue, unspecified: Secondary | ICD-10-CM | POA: Diagnosis not present

## 2014-11-30 DIAGNOSIS — R55 Syncope and collapse: Secondary | ICD-10-CM | POA: Diagnosis not present

## 2014-11-30 HISTORY — DX: Essential (primary) hypertension: I10

## 2014-11-30 MED ORDER — SULFAMETHOXAZOLE-TRIMETHOPRIM 800-160 MG PO TABS: 1.0000 | ORAL_TABLET | Freq: Two times a day (BID) | ORAL | Status: DC

## 2014-11-30 MED ORDER — MUPIROCIN CALCIUM 2 % EX CREA: 1.0000 "application " | TOPICAL_CREAM | Freq: Three times a day (TID) | CUTANEOUS | Status: DC

## 2014-11-30 MED ORDER — HYDROCODONE-ACETAMINOPHEN 5-325 MG PO TABS: ORAL_TABLET | ORAL | Status: DC

## 2014-11-30 NOTE — ED Notes (Signed)
Pt fell on Thanksgiving and injured L. Ankle. Has been having pain ever since in L. Ankle.

## 2014-12-01 NOTE — ED Provider Notes (Signed)
CSN: [id number]     Arrival date & time 11/30/14  1030 History   First MD Initiated Contact with Patient 11/30/14 1059     Chief Complaint  Patient presents with  . l. ankle pain      (Consider location/radiation/quality/duration/timing/severity/associated sxs/prior Treatment) HPI   Jorge Jefferson is a 69 y.o. male who presents to the Emergency Department complaining of chronic left ankle pain.  He reports an injury to the left ankle around Thanksgiving and c/o continued pain since that time.  Pain is worse with weight bearing and states the lateral ankle is tender to touch.  He also reports a "sore" to the ankle that has not completely healed.  He denies swelling, redness, numbness or proximal tenderness.  Past Medical History  Diagnosis Date  . Hypertension    History reviewed. No pertinent past surgical history. History reviewed. No pertinent family history. History  Substance Use Topics  . Smoking status: Current Every Day Smoker -- 0.50 packs/day    Types: Cigarettes  . Smokeless tobacco: Not on file  . Alcohol Use: Not on file    Review of Systems  Constitutional: Negative for fever and chills.  Genitourinary: Negative for dysuria and difficulty urinating.  Musculoskeletal: Positive for arthralgias (left ankle pain). Negative for joint swelling.  Skin: Negative for color change and wound.  Neurological: Negative for weakness and numbness.  All other systems reviewed and are negative.     Allergies  Review of patient's allergies indicates no known allergies.  Home Medications   Prior to Admission medications   Medication Sig Start Date End Date Taking? Authorizing Provider  HYDROcodone-acetaminophen (NORCO/VICODIN) 5-325 MG per tablet Take one-two tabs po q 4-6 hrs prn pain 11/30/14   Kristl Morioka L. Bethani Brugger, PA-C  mupirocin cream (BACTROBAN) 2 % Apply 1 application topically 3 (three) times daily. For 7-10 days 11/30/14   Stace Peace L. Taariq Leitz, PA-C   sulfamethoxazole-trimethoprim (SEPTRA DS) 800-160 MG per tablet Take 1 tablet by mouth 2 (two) times daily. For 10 days 11/30/14   Sarae Nicholes L. Delara Shepheard, PA-C   BP 155/73 mmHg  Pulse 66  Temp(Src) 97.9 F (36.6 C) (Oral)  Resp 20  Ht 6' 9.5" (2.07 m)  Wt 267 lb (121.11 kg)  BMI 28.26 kg/m2  SpO2 100% Physical Exam  Constitutional: He is oriented to person, place, and time. He appears well-developed and well-nourished. No distress.  HENT:  Head: Normocephalic and atraumatic.  Cardiovascular: Normal rate, regular rhythm, normal heart sounds and intact distal pulses.   No murmur heard. Pulmonary/Chest: Effort normal and breath sounds normal. No respiratory distress.  Musculoskeletal: He exhibits tenderness. He exhibits no edema.  Tenderness to lateral left ankle.  chronic appearing scabbed lesion.  distal sensation intact.  No erythema,edema or bony deformity.  No proximal tenderness.  Neurological: He is alert and oriented to person, place, and time. He exhibits normal muscle tone. Coordination normal.  Skin: Skin is warm and dry.  Nursing note and vitals reviewed.   ED Course  Procedures (including critical care time) Labs Review Labs Reviewed - No data to display  Imaging Review Dg Ankle Complete Left  11/30/2014   CLINICAL DATA:  Status post fall in shower on November 26 following syncopal episode ; persistent intermittent pain and swelling over the lateral malleolus  EXAM: LEFT ANKLE COMPLETE - 3+ VIEW  COMPARISON:  None  FINDINGS: The ankle joint mortise is preserved. The talar dome is intact. There is no acute malleolar fracture. The soft tissues are unremarkable.  There is a plantar calcaneal spur.  IMPRESSION: There is no acute or significant chronic bony abnormality of the left ankle.   Electronically Signed   By: David  Martinique   On: 11/30/2014 11:56     EKG Interpretation None      MDM   Final diagnoses:  Ankle pain, left  Skin lesion    Pt with chronic left ankle  pain.  No concerning sx;s for septic joint, cellulitis or ulcer.  nv intact.  Chronic appearing healing wound to lateral left ankle.  Pt agrees to arrange pmd f/u.  Will rx bactroban and septra    Chandan Fly L. Vanessa [place], PA-C 12/02/14 0013  Fredia Sorrow, MD 12/02/14 [phone]

## 2014-12-06 DIAGNOSIS — Z1211 Encounter for screening for malignant neoplasm of colon: Secondary | ICD-10-CM | POA: Diagnosis not present

## 2014-12-06 DIAGNOSIS — D123 Benign neoplasm of transverse colon: Secondary | ICD-10-CM | POA: Diagnosis not present

## 2014-12-06 DIAGNOSIS — K635 Polyp of colon: Secondary | ICD-10-CM | POA: Diagnosis not present

## 2014-12-06 DIAGNOSIS — Z86010 Personal history of colonic polyps: Secondary | ICD-10-CM | POA: Diagnosis not present

## 2014-12-06 DIAGNOSIS — D124 Benign neoplasm of descending colon: Secondary | ICD-10-CM | POA: Diagnosis not present

## 2015-01-03 ENCOUNTER — Ambulatory Visit (INDEPENDENT_AMBULATORY_CARE_PROVIDER_SITE_OTHER): Payer: Medicare Other | Admitting: Podiatry

## 2015-01-03 ENCOUNTER — Encounter: Payer: Self-pay | Admitting: Podiatry

## 2015-01-03 DIAGNOSIS — M79676 Pain in unspecified toe(s): Secondary | ICD-10-CM

## 2015-01-03 DIAGNOSIS — B351 Tinea unguium: Secondary | ICD-10-CM | POA: Diagnosis not present

## 2015-01-03 NOTE — Progress Notes (Signed)
He presents today chief complaint of painful elongated toenails one through 5 bilateral.  Objective: Pulses are strongly palpable bilateral. Nails are thick yellow dystrophic with mycotic and painful palpation.  Assessment: Pain in limb secondary to onychomycosis 1 through 5 bilateral.  Plan: Debridement of nails 1 through 5 bilateral covered service secondary to pain.

## 2015-02-06 DIAGNOSIS — I10 Essential (primary) hypertension: Secondary | ICD-10-CM | POA: Diagnosis not present

## 2015-02-06 DIAGNOSIS — Z79899 Other long term (current) drug therapy: Secondary | ICD-10-CM | POA: Diagnosis not present

## 2015-02-06 DIAGNOSIS — N529 Male erectile dysfunction, unspecified: Secondary | ICD-10-CM | POA: Diagnosis not present

## 2015-02-07 DIAGNOSIS — Z0000 Encounter for general adult medical examination without abnormal findings: Secondary | ICD-10-CM | POA: Diagnosis not present

## 2015-04-11 ENCOUNTER — Ambulatory Visit: Payer: Medicare Other

## 2015-04-18 ENCOUNTER — Encounter: Payer: Self-pay | Admitting: Podiatry

## 2015-04-18 ENCOUNTER — Ambulatory Visit (INDEPENDENT_AMBULATORY_CARE_PROVIDER_SITE_OTHER): Payer: Medicare Other | Admitting: Podiatry

## 2015-04-18 VITALS — BP 133/73 | HR 84 | Resp 18

## 2015-04-18 DIAGNOSIS — M79676 Pain in unspecified toe(s): Secondary | ICD-10-CM | POA: Diagnosis not present

## 2015-04-18 DIAGNOSIS — B351 Tinea unguium: Secondary | ICD-10-CM

## 2015-04-18 NOTE — Progress Notes (Signed)
Patient ID: Jorge Jefferson, male   DOB: 1946/08/11, 69 y.o.   MRN: [id number] Complaint:  Visit Type: Patient returns to my office for continued preventative foot care services. Complaint: Patient states" my nails have grown long and thick and become painful to walk and wear shoes" . He presents for preventative foot care services. No changes to ROS  Podiatric Exam: Vascular: dorsalis pedis and posterior tibial pulses are palpable bilateral. Capillary return is immediate. Temperature gradient is WNL. Skin turgor WNL  Sensorium: Normal Semmes Weinstein monofilament test. Normal tactile sensation bilaterally. Nail Exam: Pt has thick disfigured discolored nails with subungual debris noted bilateral entire nail hallux through fifth toenails Ulcer Exam: There is no evidence of ulcer or pre-ulcerative changes or infection. Orthopedic Exam: Muscle tone and strength are WNL. No limitations in general ROM. No crepitus or effusions noted. Foot type and digits show no abnormalities. Bony prominences are unremarkable. Skin: No Porokeratosis. No infection or ulcers  Diagnosis:  Tinea unguium, Pain in right toe, pain in left toes  Treatment & Plan Procedures and Treatment: Consent by patient was obtained for treatment procedures. The patient understood the discussion of treatment and procedures well. All questions were answered thoroughly reviewed. Debridement of mycotic and hypertrophic toenails, 1 through 5 bilateral and clearing of subungual debris. No ulceration, no infection noted.  Return Visit-Office Procedure: Patient instructed to return to the office for a follow up visit 3 months for continued evaluation and treatment.

## 2015-05-08 DIAGNOSIS — I10 Essential (primary) hypertension: Secondary | ICD-10-CM | POA: Diagnosis not present

## 2015-06-17 ENCOUNTER — Telehealth: Payer: Self-pay | Admitting: *Deleted

## 2015-06-17 NOTE — Telephone Encounter (Signed)
Pt request a callback.  I called pt, he stated that since he had been in the hospital for kidney failure his foot had been numb, and not in a good way.  I offered pt an appt sooner than the 06/26/2015 appt with Dr. Prudence Davidson, but pt stated he could wait and I told him to remember to discuss it with Dr. Prudence Davidson at the appt.  Pt agreed.

## 2015-06-20 ENCOUNTER — Ambulatory Visit: Payer: Medicare Other | Admitting: Podiatry

## 2015-06-26 ENCOUNTER — Encounter: Payer: Self-pay | Admitting: Podiatry

## 2015-06-26 ENCOUNTER — Ambulatory Visit (INDEPENDENT_AMBULATORY_CARE_PROVIDER_SITE_OTHER): Payer: Medicare Other | Admitting: Podiatry

## 2015-06-26 VITALS — BP 149/85 | HR 68 | Resp 18

## 2015-06-26 DIAGNOSIS — B351 Tinea unguium: Secondary | ICD-10-CM | POA: Diagnosis not present

## 2015-06-26 DIAGNOSIS — M79676 Pain in unspecified toe(s): Secondary | ICD-10-CM

## 2015-06-26 NOTE — Progress Notes (Signed)
Patient ID: Jorge Jefferson, male   DOB: [birth year], 69 y.o.   MRN: [id number] Complaint:  Visit Type: Patient returns to my office for continued preventative foot care services. Complaint: Patient states" my nails have grown long and thick and become painful to walk and wear shoes" . He presents for preventative foot care services. No changes to ROS.  He says he has numbness and tingling on the outside of his left foot and leg.  Podiatric Exam: Vascular: dorsalis pedis and posterior tibial pulses are palpable bilateral. Capillary return is immediate. Temperature gradient is WNL. Skin turgor WNL  Sensorium: Normal Semmes Weinstein monofilament test. Normal tactile sensation bilaterally. Tingling from sural nerve left foot. Nail Exam: Pt has thick disfigured discolored nails with subungual debris noted bilateral entire nail hallux through fifth toenails Ulcer Exam: There is no evidence of ulcer or pre-ulcerative changes or infection. Orthopedic Exam: Muscle tone and strength are WNL. No limitations in general ROM. No crepitus or effusions noted. Foot type and digits show no abnormalities. Bony prominences are unremarkable. Skin: No Porokeratosis. No infection or ulcers.  Preulcerous area lateral malleolus left ankle.  Diagnosis:  Tinea unguium, Pain in right toe, pain in left toes  Treatment & Plan Procedures and Treatment: Consent by patient was obtained for treatment procedures. The patient understood the discussion of treatment and procedures well. All questions were answered thoroughly reviewed. Debridement of mycotic and hypertrophic toenails, 1 through 5 bilateral and clearing of subungual debris. No ulceration, no infection noted.  Return Visit-Office Procedure: Patient instructed to return to the office for a follow up visit 3 months for continued evaluation and treatment.

## 2015-07-17 DIAGNOSIS — I10 Essential (primary) hypertension: Secondary | ICD-10-CM | POA: Diagnosis not present

## 2015-07-17 DIAGNOSIS — Z23 Encounter for immunization: Secondary | ICD-10-CM | POA: Diagnosis not present

## 2015-09-04 ENCOUNTER — Ambulatory Visit (INDEPENDENT_AMBULATORY_CARE_PROVIDER_SITE_OTHER): Payer: Medicare Other | Admitting: Podiatry

## 2015-09-04 DIAGNOSIS — B351 Tinea unguium: Secondary | ICD-10-CM | POA: Diagnosis not present

## 2015-09-04 DIAGNOSIS — M79676 Pain in unspecified toe(s): Secondary | ICD-10-CM | POA: Diagnosis not present

## 2015-09-04 NOTE — Progress Notes (Signed)
Patient ID: Jorge Jefferson, male   DOB: 1946-03-08, 69 y.o.   MRN: [id number] Complaint:  Visit Type: Patient returns to my office for continued preventative foot care services. Complaint: Patient states" my nails have grown long and thick and become painful to walk and wear shoes" . He presents for preventative foot care services. No changes to ROS.  He says he has numbness and tingling on the outside of his left foot and leg.  Podiatric Exam: Vascular: dorsalis pedis and posterior tibial pulses are palpable bilateral. Capillary return is immediate. Temperature gradient is WNL. Skin turgor WNL  Sensorium: Normal Semmes Weinstein monofilament test. Normal tactile sensation bilaterally. Tingling from sural nerve left foot. Nail Exam: Pt has thick disfigured discolored nails with subungual debris noted bilateral entire nail hallux through fifth toenails Ulcer Exam: There is no evidence of ulcer or pre-ulcerative changes or infection. Orthopedic Exam: Muscle tone and strength are WNL. No limitations in general ROM. No crepitus or effusions noted. Foot type and digits show no abnormalities. 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 No infection or ulcers.    Diagnosis:  Tinea unguium, Pain in right toe, pain in left toes  Treatment & Plan Procedures and Treatment: Consent by patient was obtained for treatment procedures. The patient understood the discussion of treatment and procedures well. All questions were answered thoroughly reviewed. Debridement of mycotic and hypertrophic toenails, 1 through 5 bilateral and clearing of subungual debris. No ulceration, no infection noted.  Return Visit-Office Procedure: Patient instructed to return to the office for a follow up visit 3 months for continued evaluation and treatment.  Gardiner Barefoot DPM

## 2015-09-04 NOTE — Addendum Note (Signed)
Addended by: Ezzard Flax, Jb Dulworth L on: 09/04/2015 10:25 AM   Modules accepted: Medications

## 2015-10-16 DIAGNOSIS — E785 Hyperlipidemia, unspecified: Secondary | ICD-10-CM | POA: Diagnosis not present

## 2015-10-16 DIAGNOSIS — N182 Chronic kidney disease, stage 2 (mild): Secondary | ICD-10-CM | POA: Diagnosis not present

## 2015-10-16 DIAGNOSIS — I10 Essential (primary) hypertension: Secondary | ICD-10-CM | POA: Diagnosis not present

## 2015-10-30 DIAGNOSIS — N182 Chronic kidney disease, stage 2 (mild): Secondary | ICD-10-CM | POA: Diagnosis not present

## 2015-10-30 DIAGNOSIS — E785 Hyperlipidemia, unspecified: Secondary | ICD-10-CM | POA: Diagnosis not present

## 2015-10-30 DIAGNOSIS — I10 Essential (primary) hypertension: Secondary | ICD-10-CM | POA: Diagnosis not present

## 2015-12-04 ENCOUNTER — Ambulatory Visit (INDEPENDENT_AMBULATORY_CARE_PROVIDER_SITE_OTHER): Payer: Medicare Other | Admitting: Podiatry

## 2015-12-04 ENCOUNTER — Encounter: Payer: Self-pay | Admitting: Podiatry

## 2015-12-04 DIAGNOSIS — M79676 Pain in unspecified toe(s): Secondary | ICD-10-CM | POA: Diagnosis not present

## 2015-12-04 DIAGNOSIS — B351 Tinea unguium: Secondary | ICD-10-CM

## 2015-12-04 NOTE — Progress Notes (Signed)
Patient ID: Jorge Jefferson, male   DOB: 11/08/1945, 70 y.o.   MRN: [id number] Complaint:  Visit Type: Patient returns to my office for continued preventative foot care services. Complaint: Patient states" my nails have grown long and thick and become painful to walk and wear shoes" . He presents for preventative foot care services. No changes to ROS.  He says he has numbness and tingling on the outside of his left foot and leg.  Podiatric Exam: Vascular: dorsalis pedis and posterior tibial pulses are palpable bilateral. Capillary return is immediate. Temperature gradient is WNL. Skin turgor WNL  Sensorium: Normal Semmes Weinstein monofilament test. Normal tactile sensation bilaterally. Tingling from sural nerve left foot. Nail Exam: Pt has thick disfigured discolored nails with subungual debris noted bilateral entire nail hallux through fifth toenails Ulcer Exam: There is no evidence of ulcer or pre-ulcerative changes or infection. Orthopedic Exam: Muscle tone and strength are WNL. No limitations in general ROM. No crepitus or effusions noted. Foot type and digits show no abnormalities. 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 No infection or ulcers.    Diagnosis:  Tinea unguium, Pain in right toe, pain in left toes  Treatment & Plan Procedures and Treatment: Consent by patient was obtained for treatment procedures. The patient understood the discussion of treatment and procedures well. All questions were answered thoroughly reviewed. Debridement of mycotic and hypertrophic toenails, 1 through 5 bilateral and clearing of subungual debris. No ulceration, no infection noted.  Return Visit-Office Procedure: Patient instructed to return to the office for a follow up visit 3 months for continued evaluation and treatment.  Gardiner Barefoot DPM

## 2016-02-05 DIAGNOSIS — N182 Chronic kidney disease, stage 2 (mild): Secondary | ICD-10-CM | POA: Diagnosis not present

## 2016-02-05 DIAGNOSIS — I10 Essential (primary) hypertension: Secondary | ICD-10-CM | POA: Diagnosis not present

## 2016-02-05 DIAGNOSIS — E785 Hyperlipidemia, unspecified: Secondary | ICD-10-CM | POA: Diagnosis not present

## 2016-02-26 ENCOUNTER — Ambulatory Visit: Payer: Medicare Other | Admitting: Podiatry

## 2016-03-04 ENCOUNTER — Encounter: Payer: Self-pay | Admitting: Podiatry

## 2016-03-04 ENCOUNTER — Ambulatory Visit (INDEPENDENT_AMBULATORY_CARE_PROVIDER_SITE_OTHER): Payer: Medicare Other | Admitting: Podiatry

## 2016-03-04 DIAGNOSIS — M79676 Pain in unspecified toe(s): Secondary | ICD-10-CM | POA: Diagnosis not present

## 2016-03-04 DIAGNOSIS — B351 Tinea unguium: Secondary | ICD-10-CM | POA: Diagnosis not present

## 2016-03-04 NOTE — Progress Notes (Signed)
Patient ID: Jorge Jefferson, male   DOB: 1946-03-02, 70 y.o.   MRN: [id number] Complaint:  Visit Type: Patient returns to my office for continued preventative foot care services. Complaint: Patient states" my nails have grown long and thick and become painful to walk and wear shoes" . He presents for preventative foot care services. No changes to ROS.  He says he has numbness and tingling on the outside of his left foot and leg.  Podiatric Exam: Vascular: dorsalis pedis and posterior tibial pulses are palpable bilateral. Capillary return is immediate. Temperature gradient is WNL. Skin turgor WNL  Sensorium: Normal Semmes Weinstein monofilament test. Normal tactile sensation bilaterally. Tingling from sural nerve left foot. Nail Exam: Pt has thick disfigured discolored nails with subungual debris noted bilateral entire nail hallux through fifth toenails Ulcer Exam: There is no evidence of ulcer or pre-ulcerative changes or infection. Orthopedic Exam: Muscle tone and strength are WNL. No limitations in general ROM. No crepitus or effusions noted. Foot type and digits show no abnormalities. 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 No infection or ulcers.    Diagnosis:  Tinea unguium, Pain in right toe, pain in left toes  Treatment & Plan Procedures and Treatment: Consent by patient was obtained for treatment procedures. The patient understood the discussion of treatment and procedures well. All questions were answered thoroughly reviewed. Debridement of mycotic and hypertrophic toenails, 1 through 5 bilateral and clearing of subungual debris. No ulceration, no infection noted.  Return Visit-Office Procedure: Patient instructed to return to the office for a follow up visit 3 months for continued evaluation and treatment.Patient is also concerned about his left foot.  After evaluation and discussion, we realized he had orthoses made at [facility] for this patient.  He  needs to have his orthoses evaluated by [facility]. Gardiner Barefoot DPM

## 2016-03-11 ENCOUNTER — Ambulatory Visit: Payer: Medicare Other | Admitting: *Deleted

## 2016-03-11 DIAGNOSIS — M79673 Pain in unspecified foot: Secondary | ICD-10-CM

## 2016-03-11 NOTE — Progress Notes (Signed)
Patient ID: Jorge Jefferson, male   DOB: 19-Jul-1946, 70 y.o.   MRN: [id number] Patient presents at Dr. Burnell Blanks request stating that his left orthotic is not comfortable.  After inspecting the orthotic and discussing the issue with the patient his arch has changed and he is now feeling excessive pressure in the arch area.  There is nothing I can do to make the orthotic more comfortable.  Orthotics were made in October 2014 by Dr Milinda Pointer and can not be adjusted due to age.  Explained price of new orthotics and that they are not covered by Medicare or any Medicare products.  Patient expressed frustration as he felt this was a waste for gas for a visit that was of no help to him that Dr. Prudence Davidson should have explained this to him.  I apologized but due to the age of the orthotic and the changes in his foot his best option would be to order a new pair.  He will consider this and continue to be seen for his routine foot care.

## 2016-05-06 DIAGNOSIS — I10 Essential (primary) hypertension: Secondary | ICD-10-CM | POA: Diagnosis not present

## 2016-05-06 DIAGNOSIS — F17200 Nicotine dependence, unspecified, uncomplicated: Secondary | ICD-10-CM | POA: Diagnosis not present

## 2016-05-06 DIAGNOSIS — E785 Hyperlipidemia, unspecified: Secondary | ICD-10-CM | POA: Diagnosis not present

## 2016-05-20 ENCOUNTER — Emergency Department (HOSPITAL_COMMUNITY): Payer: Medicare Other

## 2016-05-20 ENCOUNTER — Emergency Department (HOSPITAL_COMMUNITY)
Admission: EM | Admit: 2016-05-20 | Discharge: 2016-05-20 | Disposition: A | Discharge status: Home / Self Care | Payer: Medicare Other | Attending: Emergency Medicine | Admitting: Emergency Medicine

## 2016-05-20 ENCOUNTER — Encounter (HOSPITAL_COMMUNITY): Payer: Self-pay | Admitting: Emergency Medicine

## 2016-05-20 DIAGNOSIS — F17210 Nicotine dependence, cigarettes, uncomplicated: Secondary | ICD-10-CM | POA: Insufficient documentation

## 2016-05-20 DIAGNOSIS — Z792 Long term (current) use of antibiotics: Secondary | ICD-10-CM | POA: Diagnosis not present

## 2016-05-20 DIAGNOSIS — X501XXA Overexertion from prolonged static or awkward postures, initial encounter: Secondary | ICD-10-CM | POA: Diagnosis not present

## 2016-05-20 DIAGNOSIS — S99912A Unspecified injury of left ankle, initial encounter: Secondary | ICD-10-CM | POA: Diagnosis present

## 2016-05-20 DIAGNOSIS — Y9301 Activity, walking, marching and hiking: Secondary | ICD-10-CM | POA: Insufficient documentation

## 2016-05-20 DIAGNOSIS — Y929 Unspecified place or not applicable: Secondary | ICD-10-CM | POA: Insufficient documentation

## 2016-05-20 DIAGNOSIS — I10 Essential (primary) hypertension: Secondary | ICD-10-CM | POA: Insufficient documentation

## 2016-05-20 DIAGNOSIS — Z79899 Other long term (current) drug therapy: Secondary | ICD-10-CM | POA: Insufficient documentation

## 2016-05-20 DIAGNOSIS — Y999 Unspecified external cause status: Secondary | ICD-10-CM | POA: Diagnosis not present

## 2016-05-20 DIAGNOSIS — M7989 Other specified soft tissue disorders: Secondary | ICD-10-CM | POA: Diagnosis not present

## 2016-05-20 DIAGNOSIS — S93402A Sprain of unspecified ligament of left ankle, initial encounter: Secondary | ICD-10-CM | POA: Diagnosis not present

## 2016-05-20 MED ORDER — HYDROCODONE-ACETAMINOPHEN 5-325 MG PO TABS
1.0000 | ORAL_TABLET | Freq: Once | ORAL | Status: AC
  Administered 2016-05-20: 1 via ORAL
  Filled 2016-05-20: qty 1

## 2016-05-20 MED ORDER — HYDROCODONE-ACETAMINOPHEN 5-325 MG PO TABS: 1.0000 | ORAL_TABLET | Freq: Four times a day (QID) | ORAL | 0 refills | Status: DC | PRN

## 2016-05-20 NOTE — ED Notes (Signed)
Ortho notified of ASO ankle.

## 2016-05-20 NOTE — ED Notes (Signed)
Discharge instructions, follow up care, and rx x1 reviewed with patient. Patient verbalized understanding. Patient aware it is unsafe to drive after medication intake.

## 2016-05-20 NOTE — ED Notes (Signed)
Patient transported to X-ray 

## 2016-05-20 NOTE — ED Provider Notes (Signed)
[facility] DEPT Provider Note   CSN: [id number] Arrival date & time: 05/20/16  1016  First Provider Contact:  First MD Initiated Contact with Patient 05/20/16 1053        History   Chief Complaint Chief Complaint  Patient presents with  . Ankle Pain    HPI RHODES Jorge Jefferson is a 70 y.o. male.  HPI 70 year old male with past medical history of hypertension who presents with mild left ankle pain. 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 This is a chronic issue that is not acutely worsened. However, while walking last week, he feels that he twisted his left ankle since then he's had mildly worsening left ankle pain. He describes the pain as an aching, throbbing sensation that is made worse with palpation and movement. Denies any falls. Denies any distal numbness or weakness. Denies any redness. No other weakness or numbness. No other medical complaints.  Past Medical History:  Diagnosis Date  . Hypertension     There are no active problems to display for this patient.   History reviewed. No pertinent surgical history.     Home Medications    Prior to Admission medications   Medication Sig Start Date End Date Taking? Authorizing Provider  amLODipine (NORVASC) 10 MG tablet Take 10 mg by mouth daily. 04/03/15   Historical Provider, MD  BENICAR HCT 20-12.5 MG per tablet  08/08/14   Historical Provider, MD  CHANTIX STARTING MONTH PAK 0.5 MG X 11 & 1 MG X 42 tablet See admin instructions. 02/15/15   Historical Provider, MD  cloNIDine (CATAPRES) 0.1 MG tablet  04/15/15   Historical Provider, MD  fluconazole (DIFLUCAN) 150 MG tablet  01/10/14   Historical Provider, MD  HYDROcodone-acetaminophen (NORCO/VICODIN) 5-325 MG tablet Take 1 tablet by mouth every 6 (six) hours as needed for severe pain. 05/20/16   Duffy Bruce, MD  LIVALO 2 MG TABS  12/05/13   Historical Provider, MD  losartan (COZAAR) 100 MG  tablet Take 100 mg by mouth daily.    Historical Provider, MD  mupirocin cream (BACTROBAN) 2 % Apply 1 application topically 3 (three) times daily. For 7-10 days 11/30/14   Kem Parkinson, PA-C  neomycin-polymyxin-hydrocortisone (CORTISPORIN) otic solution 1-2 drops to the toe after soaking twice daily 08/09/14   Max T Hyatt, DPM  ofloxacin (FLOXIN) 0.3 % otic solution  11/01/13   Historical Provider, MD  ofloxacin (OCUFLOX) 0.3 % ophthalmic solution INSTILL 10 DROPS INTO AFFECTED EAR ONCE A DAY OTIC 7 DAY(S) 03/08/15   Historical Provider, MD  rosuvastatin (CRESTOR) 5 MG tablet Take 5 mg by mouth at bedtime.    Historical Provider, MD  sulfamethoxazole-trimethoprim (SEPTRA DS) 800-160 MG per tablet Take 1 tablet by mouth 2 (two) times daily. For 10 days 11/30/14   Kem Parkinson, PA-C    Family History No family history on file.  Social History Social History  Substance Use Topics  . Smoking status: Current Every Day Smoker    Packs/day: 0.50    Types: Cigarettes  . Smokeless tobacco: Never Used  . Alcohol use Yes     Comment: socially      Allergies   Review of patient's allergies indicates no known allergies.   Review of Systems Review of Systems  Constitutional: Negative for chills, fatigue and fever.  HENT: Negative for congestion and rhinorrhea.   Eyes: Negative for visual disturbance.  Respiratory: Negative for cough, shortness of breath  and wheezing.   Cardiovascular: Negative for chest pain and leg swelling.  Gastrointestinal: Negative for abdominal pain, diarrhea, nausea and vomiting.  Genitourinary: Negative for dysuria and flank pain.  Musculoskeletal: Positive for gait problem and joint swelling. Negative for neck pain and neck stiffness.  Skin: Negative for rash and wound.  Allergic/Immunologic: Negative for immunocompromised state.  Neurological: Negative for syncope, weakness and headaches.     Physical Exam Updated Vital Signs BP 133/67   Pulse 63   Temp  98.1 F (36.7 C) (Oral)   Resp 18   SpO2 98%   Physical Exam  Constitutional: He is oriented to person, place, and time. He appears well-developed and well-nourished. No distress.  HENT:  Head: Normocephalic and atraumatic.  Eyes: Conjunctivae are normal.  Neck: Neck supple.  Cardiovascular: Normal rate, regular rhythm and normal heart sounds.   Pulmonary/Chest: Effort normal. No respiratory distress. He has no wheezes.  Abdominal: He exhibits no distension.  Musculoskeletal: He exhibits no edema.  Neurological: He is alert and oriented to person, place, and time. He exhibits normal muscle tone.  Skin: Skin is warm. Capillary refill takes less than 2 seconds. No rash noted.  Nursing note and vitals reviewed.   LOWER EXTREMITY EXAM: LEFT  INSPECTION & PALPATION: Moderate TTP over anterior aspect of distal lateral malleolus with TTP over talotibial ligament. No deformity. No ligamentous laxity. No joint effusion or warmth. pROM is painless.  SENSORY: sensation is intact to light touch in:  Superficial peroneal nerve distribution (over dorsum of foot) Deep peroneal nerve distribution (over first dorsal web space) Sural nerve distribution (over lateral aspect 5th metatarsal) Saphenous nerve distribution (over medial instep)  MOTOR:  + Motor EHL (great toe dorsiflexion) + FHL (great toe plantar flexion)  + TA (ankle dorsiflexion)  + GSC (ankle plantar flexion)  VASCULAR: 2+ dorsalis pedis and posterior tibialis pulses Capillary refill < 2 sec, toes warm and well-perfused  COMPARTMENTS: Soft, warm, well-perfused No pain with passive extension No parethesias    ED Treatments / Results  Labs (all labs ordered are listed, but only abnormal results are displayed) Labs Reviewed - No data to display  EKG  EKG Interpretation None       Radiology Dg Ankle Complete Left  Result Date: 05/20/2016 CLINICAL DATA:  LEFT ankle pain.  No known trauma. EXAM: LEFT ANKLE COMPLETE  - 3+ VIEW COMPARISON:  None. FINDINGS: There is no evidence of fracture, dislocation, or joint effusion. There is no evidence of arthropathy or other focal bone abnormality. Slight lateral soft tissue swelling. IMPRESSION: Slight lateral soft tissue swelling.  No fracture or dislocation. Electronically Signed   By: Staci Righter M.D.   On: 05/20/2016 11:33    Procedures Procedures (including critical care time)  Medications Ordered in ED Medications  HYDROcodone-acetaminophen (NORCO/VICODIN) 5-325 MG per tablet 1 tablet (1 tablet Oral Given 05/20/16 1101)     Initial Impression / Assessment and Plan / ED Course  I have reviewed the triage vital signs and the nursing notes.  Pertinent labs & imaging results that were available during my care of the patient were reviewed by me and considered in my medical decision making (see chart for details).  Clinical Course    70 year old male who presents with a weeklong history of mild left ankle pain. Examination is as above. Plain film shows swelling over the lateral aspect of the ankle with no bony abnormality. Suspect mild anterior tibiotalar ligament sprain. He has no ligamentous instability on exam to  suggest full rupture. Distal neurovascular is fully intact. He has no overlying skin changes to suggest cellulitis or infectious process. Do not suspect septic arthritis. Will give ASO brace and advise outpatient follow-up  Final Clinical Impressions(s) / ED Diagnoses   Final diagnoses:  Left ankle sprain, initial encounter    New Prescriptions Discharge Medication List as of 05/20/2016 11:55 AM       Duffy Bruce, MD 05/20/16 2123

## 2016-05-20 NOTE — ED Triage Notes (Signed)
Patient here with complaints of left ankle pain. Denies injury. States that he has "nerve damage". Ambulatory in triage.

## 2016-05-20 NOTE — ED Notes (Signed)
Patient asking RN if he is able to drive home. Patient made aware that Vicodin can make him drowsy and he is advised to not drive home. Patient verbalizes understanding.

## 2016-06-03 ENCOUNTER — Ambulatory Visit (INDEPENDENT_AMBULATORY_CARE_PROVIDER_SITE_OTHER): Payer: Medicare Other | Admitting: Podiatry

## 2016-06-03 ENCOUNTER — Encounter: Payer: Self-pay | Admitting: Podiatry

## 2016-06-03 DIAGNOSIS — M79676 Pain in unspecified toe(s): Secondary | ICD-10-CM

## 2016-06-03 DIAGNOSIS — B351 Tinea unguium: Secondary | ICD-10-CM

## 2016-06-03 NOTE — Progress Notes (Signed)
Patient ID: Jorge Jefferson, male   DOB: 07-03-1946, 70 y.o.   MRN: [id number] Complaint:  Visit Type: Patient returns to my office for continued preventative foot care services. Complaint: Patient states" my nails have grown long and thick and become painful to walk and wear shoes" . He presents for preventative foot care services. No changes to ROS.  He says he has worn an ankle brace which has helped his left foot and leg.  Podiatric Exam: Vascular: dorsalis pedis and posterior tibial pulses are palpable bilateral. Capillary return is immediate. Temperature gradient is WNL. Skin turgor WNL  Sensorium: Normal Semmes Weinstein monofilament test. Normal tactile sensation bilaterally. Tingling from sural nerve left foot. Nail Exam: Pt has thick disfigured discolored nails with subungual debris noted bilateral entire nail hallux through fifth toenails Ulcer Exam: There is no evidence of ulcer or pre-ulcerative changes or infection. Orthopedic Exam: Muscle tone and strength are WNL. No limitations in general ROM. 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 No infection or ulcers.    Diagnosis:  Tinea unguium, Pain in right toe, pain in left toes  Treatment & Plan Procedures and Treatment: Consent by patient was obtained for treatment procedures. The patient understood the discussion of treatment and procedures well. All questions were answered thoroughly reviewed. Debridement of mycotic and hypertrophic toenails, 1 through 5 bilateral and clearing of subungual debris. No ulceration, no infection noted.  Return Visit-Office Procedure: Patient instructed to return to the office for a follow up visit 3 months for continued evaluation and treatment.  Gardiner Barefoot DPM

## 2016-09-02 ENCOUNTER — Ambulatory Visit (INDEPENDENT_AMBULATORY_CARE_PROVIDER_SITE_OTHER): Payer: Medicare Other | Admitting: Podiatry

## 2016-09-02 ENCOUNTER — Encounter: Payer: Self-pay | Admitting: Podiatry

## 2016-09-02 VITALS — Resp 20 | Ht >= 80 in | Wt 267.0 lb

## 2016-09-02 DIAGNOSIS — B351 Tinea unguium: Secondary | ICD-10-CM | POA: Diagnosis not present

## 2016-09-02 DIAGNOSIS — M79676 Pain in unspecified toe(s): Secondary | ICD-10-CM

## 2016-09-02 NOTE — Progress Notes (Signed)
Patient ID: Jorge Jefferson, male   DOB: 11-20-1945, 70 y.o.   MRN: [id number] Complaint:  Visit Type: Patient returns to my office for continued preventative foot care services. Complaint: Patient states" my nails have grown long and thick and become painful to walk and wear shoes" . He presents for preventative foot care services. No changes to ROS.  He says he has worn an ankle brace which has helped his left foot and leg.  Podiatric Exam: Vascular: dorsalis pedis and posterior tibial pulses are palpable bilateral. Capillary return is immediate. Temperature gradient is WNL. Skin turgor WNL  Sensorium: Normal Semmes Weinstein monofilament test. Normal tactile sensation bilaterally. Tingling from sural nerve left foot. Nail Exam: Pt has thick disfigured discolored nails with subungual debris noted bilateral entire nail hallux through fifth toenails Ulcer Exam: There is no evidence of ulcer or pre-ulcerative changes or infection. Orthopedic Exam: Muscle tone and strength are WNL. No limitations in general ROM. 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 No infection or ulcers.    Diagnosis:  Tinea unguium, Pain in right toe, pain in left toes  Treatment & Plan Procedures and Treatment: Consent by patient was obtained for treatment procedures. The patient understood the discussion of treatment and procedures well. All questions were answered thoroughly reviewed. Debridement of mycotic and hypertrophic toenails, 1 through 5 bilateral and clearing of subungual debris. No ulceration, no infection noted.  Return Visit-Office Procedure: Patient instructed to return to the office for a follow up visit 3 months for continued evaluation and treatment.  Gardiner Barefoot DPM

## 2016-09-30 DIAGNOSIS — E785 Hyperlipidemia, unspecified: Secondary | ICD-10-CM | POA: Diagnosis not present

## 2016-09-30 DIAGNOSIS — I10 Essential (primary) hypertension: Secondary | ICD-10-CM | POA: Diagnosis not present

## 2016-09-30 DIAGNOSIS — Z6830 Body mass index (BMI) 30.0-30.9, adult: Secondary | ICD-10-CM | POA: Diagnosis not present

## 2016-10-06 ENCOUNTER — Other Ambulatory Visit (HOSPITAL_COMMUNITY): Payer: Self-pay | Admitting: Family Medicine

## 2016-10-06 ENCOUNTER — Other Ambulatory Visit: Payer: Self-pay | Admitting: Family Medicine

## 2016-10-06 DIAGNOSIS — R200 Anesthesia of skin: Secondary | ICD-10-CM

## 2016-10-06 DIAGNOSIS — R202 Paresthesia of skin: Principal | ICD-10-CM

## 2016-10-06 DIAGNOSIS — R209 Unspecified disturbances of skin sensation: Secondary | ICD-10-CM

## 2016-10-08 ENCOUNTER — Ambulatory Visit (HOSPITAL_COMMUNITY)
Admission: RE | Admit: 2016-10-08 | Discharge: 2016-10-08 | Disposition: A | Discharge status: Home / Self Care | Payer: Medicare Other | Source: Ambulatory Visit | Attending: Family Medicine | Admitting: Family Medicine

## 2016-10-08 DIAGNOSIS — I70202 Unspecified atherosclerosis of native arteries of extremities, left leg: Secondary | ICD-10-CM | POA: Diagnosis not present

## 2016-10-08 DIAGNOSIS — I743 Embolism and thrombosis of arteries of the lower extremities: Secondary | ICD-10-CM | POA: Diagnosis not present

## 2016-10-08 DIAGNOSIS — M79662 Pain in left lower leg: Secondary | ICD-10-CM | POA: Diagnosis not present

## 2016-10-08 DIAGNOSIS — R200 Anesthesia of skin: Secondary | ICD-10-CM

## 2016-10-08 DIAGNOSIS — R202 Paresthesia of skin: Secondary | ICD-10-CM

## 2016-12-09 ENCOUNTER — Ambulatory Visit: Payer: Medicare Other | Admitting: Podiatry

## 2016-12-11 ENCOUNTER — Encounter: Payer: Self-pay | Admitting: Podiatry

## 2016-12-11 ENCOUNTER — Ambulatory Visit (INDEPENDENT_AMBULATORY_CARE_PROVIDER_SITE_OTHER): Payer: Self-pay | Admitting: Podiatry

## 2016-12-11 DIAGNOSIS — M79676 Pain in unspecified toe(s): Secondary | ICD-10-CM

## 2016-12-11 DIAGNOSIS — B351 Tinea unguium: Secondary | ICD-10-CM

## 2016-12-11 NOTE — Progress Notes (Signed)
Patient ID: Jorge Jefferson, male   DOB: 1946-07-10, 71 y.o.   MRN: [id number] Complaint:  Visit Type: Patient returns to my office for continued preventative foot care services. Complaint: Patient states" my nails have grown long and thick and become painful to walk and wear shoes" . He presents for preventative foot care services. No changes to ROS.  He says he has worn an ankle brace which has helped his left foot and leg.  Podiatric Exam: Vascular: dorsalis pedis and posterior tibial pulses are palpable bilateral. Capillary return is immediate. Temperature gradient is WNL. Skin turgor WNL  Sensorium: Normal Semmes Weinstein monofilament test. Normal tactile sensation bilaterally. Tingling from sural nerve left foot. Nail Exam: Pt has thick disfigured discolored nails with subungual debris noted bilateral entire nail hallux through fifth toenails Ulcer Exam: There is no evidence of ulcer or pre-ulcerative changes or infection. Orthopedic Exam: Muscle tone and strength are WNL. No limitations in general ROM. 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 No infection or ulcers.    Diagnosis:  Tinea unguium, Pain in right toe, pain in left toes  Treatment & Plan Procedures and Treatment: Consent by patient was obtained for treatment procedures. The patient understood the discussion of treatment and procedures well. All questions were answered thoroughly reviewed. Debridement of mycotic and hypertrophic toenails, 1 through 5 bilateral and clearing of subungual debris. No ulceration, no infection noted.  Return Visit-Office Procedure: Patient instructed to return to the office for a follow up visit 3 months for continued evaluation and treatment.  Gardiner Barefoot DPM

## 2017-01-06 DIAGNOSIS — Z6830 Body mass index (BMI) 30.0-30.9, adult: Secondary | ICD-10-CM | POA: Diagnosis not present

## 2017-01-06 DIAGNOSIS — Z720 Tobacco use: Secondary | ICD-10-CM | POA: Diagnosis not present

## 2017-01-06 DIAGNOSIS — N182 Chronic kidney disease, stage 2 (mild): Secondary | ICD-10-CM | POA: Diagnosis not present

## 2017-01-06 DIAGNOSIS — I10 Essential (primary) hypertension: Secondary | ICD-10-CM | POA: Diagnosis not present

## 2017-01-06 DIAGNOSIS — E785 Hyperlipidemia, unspecified: Secondary | ICD-10-CM | POA: Diagnosis not present

## 2017-03-10 ENCOUNTER — Encounter: Payer: Self-pay | Admitting: Podiatry

## 2017-03-10 ENCOUNTER — Ambulatory Visit (INDEPENDENT_AMBULATORY_CARE_PROVIDER_SITE_OTHER): Payer: Medicare Other | Admitting: Podiatry

## 2017-03-10 DIAGNOSIS — M79676 Pain in unspecified toe(s): Secondary | ICD-10-CM | POA: Diagnosis not present

## 2017-03-10 DIAGNOSIS — B351 Tinea unguium: Secondary | ICD-10-CM | POA: Diagnosis not present

## 2017-03-10 NOTE — Progress Notes (Signed)
Patient ID: Jorge Jefferson, male   DOB: June 05, 1946, 71 y.o.   MRN: [id number] Complaint:  Visit Type: Patient returns to my office for continued preventative foot care services. Complaint: Patient states" my nails have grown long and thick and become painful to walk and wear shoes" . He presents for preventative foot care services. No changes to ROS.  He says he has worn an ankle brace which has helped his left foot and leg.  Podiatric Exam: Vascular: dorsalis pedis and posterior tibial pulses are palpable bilateral. Capillary return is immediate. Temperature gradient is WNL. Skin turgor WNL  Sensorium: Normal Semmes Weinstein monofilament test. Normal tactile sensation bilaterally. Tingling from sural nerve left foot. Nail Exam: Pt has thick disfigured discolored nails with subungual debris noted bilateral entire nail hallux through fifth toenails Ulcer Exam: There is no evidence of ulcer or pre-ulcerative changes or infection. Orthopedic Exam: Muscle tone and strength are WNL. No limitations in general ROM. 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 No infection or ulcers.    Diagnosis:  Tinea unguium, Pain in right toe, pain in left toes  Treatment & Plan Procedures and Treatment: Consent by patient was obtained for treatment procedures. The patient understood the discussion of treatment and procedures well. All questions were answered thoroughly reviewed. Debridement of mycotic and hypertrophic toenails, 1 through 5 bilateral and clearing of subungual debris. No ulceration, no infection noted.  Return Visit-Office Procedure: Patient instructed to return to the office for a follow up visit 3 months for continued evaluation and treatment.  Gardiner Barefoot DPM

## 2017-03-19 ENCOUNTER — Emergency Department (HOSPITAL_COMMUNITY)
Admission: EM | Admit: 2017-03-19 | Discharge: 2017-03-19 | Disposition: A | Discharge status: Home / Self Care | Payer: Medicare Other | Attending: Emergency Medicine | Admitting: Emergency Medicine

## 2017-03-19 ENCOUNTER — Encounter (HOSPITAL_COMMUNITY): Payer: Self-pay | Admitting: Emergency Medicine

## 2017-03-19 ENCOUNTER — Emergency Department (HOSPITAL_COMMUNITY): Payer: Medicare Other

## 2017-03-19 DIAGNOSIS — R600 Localized edema: Secondary | ICD-10-CM | POA: Insufficient documentation

## 2017-03-19 DIAGNOSIS — M25472 Effusion, left ankle: Secondary | ICD-10-CM

## 2017-03-19 DIAGNOSIS — M25572 Pain in left ankle and joints of left foot: Secondary | ICD-10-CM | POA: Diagnosis not present

## 2017-03-19 DIAGNOSIS — R2242 Localized swelling, mass and lump, left lower limb: Secondary | ICD-10-CM | POA: Diagnosis not present

## 2017-03-19 DIAGNOSIS — R609 Edema, unspecified: Secondary | ICD-10-CM

## 2017-03-19 DIAGNOSIS — M7989 Other specified soft tissue disorders: Secondary | ICD-10-CM | POA: Diagnosis not present

## 2017-03-19 DIAGNOSIS — M129 Arthropathy, unspecified: Secondary | ICD-10-CM | POA: Diagnosis present

## 2017-03-19 NOTE — Discharge Instructions (Signed)
Tylenol for pain.  Elevate foot to reduce swelling.

## 2017-03-19 NOTE — ED Provider Notes (Signed)
Patient seen and evaluated. Discussed with Dr. Charlies Silvers. Patient reports pain over several years. It started again 3 days ago without injury. Is tender in the area of the ATF ligament. No palpable joint effusion present. No erythema to suggest gout or cellulitis. Has normal x-ray. Agree with symptomatic treatment. 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 Tylenol, initial, ice and elevate, avoid weightbearing, primary care   Tanna Furry, MD 03/19/17 1714

## 2017-03-19 NOTE — ED Provider Notes (Signed)
[facility] DEPT Provider Note   CSN: [id number] Arrival date & time: 03/19/17  1403     History   Chief Complaint Chief Complaint  Patient presents with  . Joint Swelling    left    HPI Jorge Jefferson is a 71 y.o. male.  The history is provided by the patient. No language interpreter was used.  Ankle Pain   The incident occurred 1 to 2 hours ago. The incident occurred at home. The injury mechanism was torsion. The pain is present in the left ankle. The pain is at a severity of 2/10. The pain is moderate. The pain has been constant since onset. He reports no foreign bodies present. He has tried nothing for the symptoms.  Pt reports he fell in the shower last thanksgiving.  Pt reports he had a scab on ankle that he picked off.  Pt reports ankle is swollen   Past Medical History:  Diagnosis Date  . Hypertension     There are no active problems to display for this patient.   History reviewed. No pertinent surgical history.     Home Medications    Prior to Admission medications   Medication Sig Start Date End Date Taking? Authorizing Provider  amLODipine (NORVASC) 10 MG tablet Take 10 mg by mouth daily. 04/03/15  Yes [provider]  aspirin EC 81 MG tablet Take 81 mg by mouth daily.   Yes [provider]  cilostazol (PLETAL) 100 MG tablet Take 100 mg by mouth daily. 03/02/17  Yes [provider]  cloNIDine (CATAPRES) 0.1 MG tablet  04/15/15  Yes [provider]  HYDROcodone-acetaminophen (NORCO/VICODIN) 5-325 MG tablet Take 1 tablet by mouth every 6 (six) hours as needed for severe pain. 05/20/16  Yes Duffy Bruce, MD  Multiple Vitamins-Minerals (CENTRUM ADULTS) TABS Take 1 tablet by mouth daily.   Yes [provider]  neomycin-polymyxin-hydrocortisone (CORTISPORIN) otic solution 1-2 drops to the toe after soaking twice daily 08/09/14  Yes Hyatt, Max T, DPM    Family History No family history on file.  Social  History Social History  Substance Use Topics  . Smoking status: Current Every Day Smoker    Packs/day: 0.50    Types: Cigarettes  . Smokeless tobacco: Never Used  . Alcohol use Yes     Comment: socially      Allergies   Patient has no known allergies.   Review of Systems Review of Systems  All other systems reviewed and are negative.    Physical Exam Updated Vital Signs BP (!) 150/84 (BP Location: Left Arm)   Pulse 81   Temp 98.2 F (36.8 C) (Oral)   Resp 18   Ht 6' 9.5" (2.07 m)   Wt 120.3 kg (265 lb 5 oz)   SpO2 98%   BMI 28.08 kg/m   Physical Exam  Constitutional: He is oriented to person, place, and time. He appears well-developed and well-nourished.  HENT:  Head: Normocephalic.  Neck: Normal range of motion.  Cardiovascular: Normal rate.   Pulmonary/Chest: Effort normal.  Abdominal: He exhibits no distension.  Musculoskeletal: Normal range of motion.  Scar lateral malleolus left ankle,  Good range of motion, nv and ns intact,    Neurological: He is alert and oriented to person, place, and time.  Psychiatric: He has a normal mood and affect.  Nursing note and vitals reviewed.    ED Treatments / Results  Labs (all labs ordered are listed, but only abnormal results are displayed) Labs  Reviewed - No data to display  EKG  EKG Interpretation None       Radiology Dg Ankle Complete Left  Result Date: 03/19/2017 CLINICAL DATA:  Acute left ankle pain and swelling without known injury. EXAM: LEFT ANKLE COMPLETE - 3+ VIEW COMPARISON:  None. FINDINGS: There is no evidence of fracture, dislocation, or joint effusion. There is no evidence of arthropathy or other focal bone abnormality. Soft tissues are unremarkable. IMPRESSION: Normal left ankle. Electronically Signed   By: Marijo Conception, M.D.   On: 03/19/2017 16:43    Procedures Procedures (including critical care time)  Medications Ordered in ED Medications - No data to display   Initial Impression  / Assessment and Plan / ED Course  I have reviewed the triage vital signs and the nursing notes.  Pertinent labs & imaging results that were available during my care of the patient were reviewed by me and considered in my medical decision making (see chart for details).       Final Clinical Impressions(s) / ED Diagnoses   Final diagnoses:  Ankle swelling, left  Peripheral edema    New Prescriptions Discharge Medication List as of 03/19/2017  4:59 PM    An After Visit Summary was printed and given to the patient. Pt advised tylenol, elevate Ace wrap given    [name] 03/19/17 1743    Tanna Furry, MD 03/29/17 [phone]

## 2017-03-19 NOTE — ED Triage Notes (Addendum)
Patient reports that couple years ago on thanksgiving he believes he had blacked out and injured his left knee and ankle.  Patient states that he has been seen here a couple of times since for the ankle pain and intermittent swelling.  Patient states that he has had swelling in left ankle that started 3 days ago.  Patient denies any new injury to cause the swelling or pain.

## 2017-04-07 DIAGNOSIS — I10 Essential (primary) hypertension: Secondary | ICD-10-CM | POA: Diagnosis not present

## 2017-04-07 DIAGNOSIS — E785 Hyperlipidemia, unspecified: Secondary | ICD-10-CM | POA: Diagnosis not present

## 2017-04-07 DIAGNOSIS — M25559 Pain in unspecified hip: Secondary | ICD-10-CM | POA: Diagnosis not present

## 2017-04-08 ENCOUNTER — Other Ambulatory Visit (HOSPITAL_COMMUNITY): Payer: Self-pay | Admitting: Family Medicine

## 2017-04-08 ENCOUNTER — Ambulatory Visit (HOSPITAL_COMMUNITY)
Admission: RE | Admit: 2017-04-08 | Discharge: 2017-04-08 | Disposition: A | Discharge status: Home / Self Care | Payer: Medicare Other | Source: Ambulatory Visit | Attending: Family Medicine | Admitting: Family Medicine

## 2017-04-08 DIAGNOSIS — M85852 Other specified disorders of bone density and structure, left thigh: Secondary | ICD-10-CM | POA: Insufficient documentation

## 2017-04-08 DIAGNOSIS — M25559 Pain in unspecified hip: Secondary | ICD-10-CM

## 2017-04-08 DIAGNOSIS — M25552 Pain in left hip: Secondary | ICD-10-CM | POA: Diagnosis not present

## 2017-04-08 DIAGNOSIS — M1611 Unilateral primary osteoarthritis, right hip: Secondary | ICD-10-CM | POA: Diagnosis not present

## 2017-04-08 DIAGNOSIS — M25551 Pain in right hip: Secondary | ICD-10-CM | POA: Insufficient documentation

## 2017-04-08 DIAGNOSIS — M85851 Other specified disorders of bone density and structure, right thigh: Secondary | ICD-10-CM | POA: Insufficient documentation

## 2017-04-08 DIAGNOSIS — M1612 Unilateral primary osteoarthritis, left hip: Secondary | ICD-10-CM | POA: Diagnosis not present

## 2017-04-08 DIAGNOSIS — M160 Bilateral primary osteoarthritis of hip: Secondary | ICD-10-CM | POA: Diagnosis not present

## 2017-06-09 ENCOUNTER — Encounter: Payer: Self-pay | Admitting: Podiatry

## 2017-06-09 ENCOUNTER — Ambulatory Visit (INDEPENDENT_AMBULATORY_CARE_PROVIDER_SITE_OTHER): Payer: Medicare Other | Admitting: Podiatry

## 2017-06-09 DIAGNOSIS — B351 Tinea unguium: Secondary | ICD-10-CM

## 2017-06-09 DIAGNOSIS — M79676 Pain in unspecified toe(s): Secondary | ICD-10-CM | POA: Diagnosis not present

## 2017-06-09 DIAGNOSIS — Z0000 Encounter for general adult medical examination without abnormal findings: Secondary | ICD-10-CM | POA: Diagnosis not present

## 2017-06-09 NOTE — Progress Notes (Signed)
Patient ID: Jorge Jefferson, male   DOB: 03-02-1946, 71 y.o.   MRN: [id number] Complaint:  Visit Type: Patient returns to my office for continued preventative foot care services. Complaint: Patient states" my nails have grown long and thick and become painful to walk and wear shoes" . He presents for preventative foot care services. No changes to ROS.  He says he has worn an ankle brace which has helped his left foot and leg.  Podiatric Exam: Vascular: dorsalis pedis and posterior tibial pulses are palpable bilateral. Capillary return is immediate. Temperature gradient is WNL. Skin turgor WNL  Sensorium: Normal Semmes Weinstein monofilament test. Normal tactile sensation bilaterally. Tingling from sural nerve left foot. Nail Exam: Pt has thick disfigured discolored nails with subungual debris noted bilateral entire nail hallux through fifth toenails Ulcer Exam: There is no evidence of ulcer or pre-ulcerative changes or infection. Orthopedic Exam: Muscle tone and strength are WNL. No limitations in general ROM. 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 Skin: No Porokeratosis. No infection or ulcers.    Diagnosis:  Tinea unguium, Pain in right toe, pain in left toes  Treatment & Plan Procedures and Treatment: Consent by patient was obtained for treatment procedures. The patient understood the discussion of treatment and procedures well. All questions were answered thoroughly reviewed. Debridement of mycotic and hypertrophic toenails, 1 through 5 bilateral and clearing of subungual debris. No ulceration, no infection noted.  Return Visit-Office Procedure: Patient instructed to return to the office for a follow up visit 3 months for continued evaluation and treatment.  Gardiner Barefoot DPM

## 2017-07-29 IMAGING — CR DG ANKLE COMPLETE 3+V*L*
3 series · 3 of 3 positions shown · non-contrast
Comparison: None.

CLINICAL DATA: LEFT ankle pain.  No known trauma.

EXAM:
LEFT ANKLE COMPLETE - 3+ VIEW

[x ankle ap left]
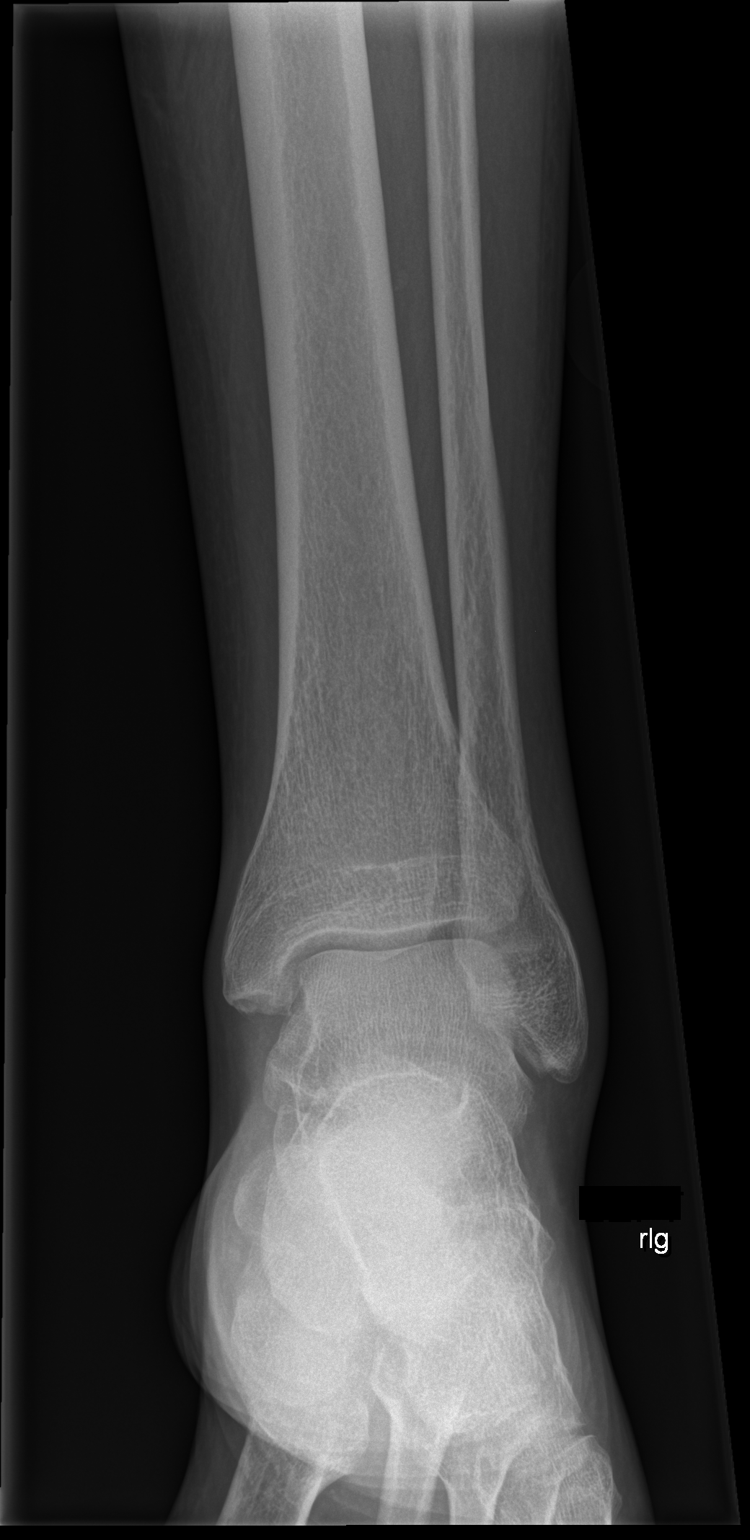

[x ankle obl left]
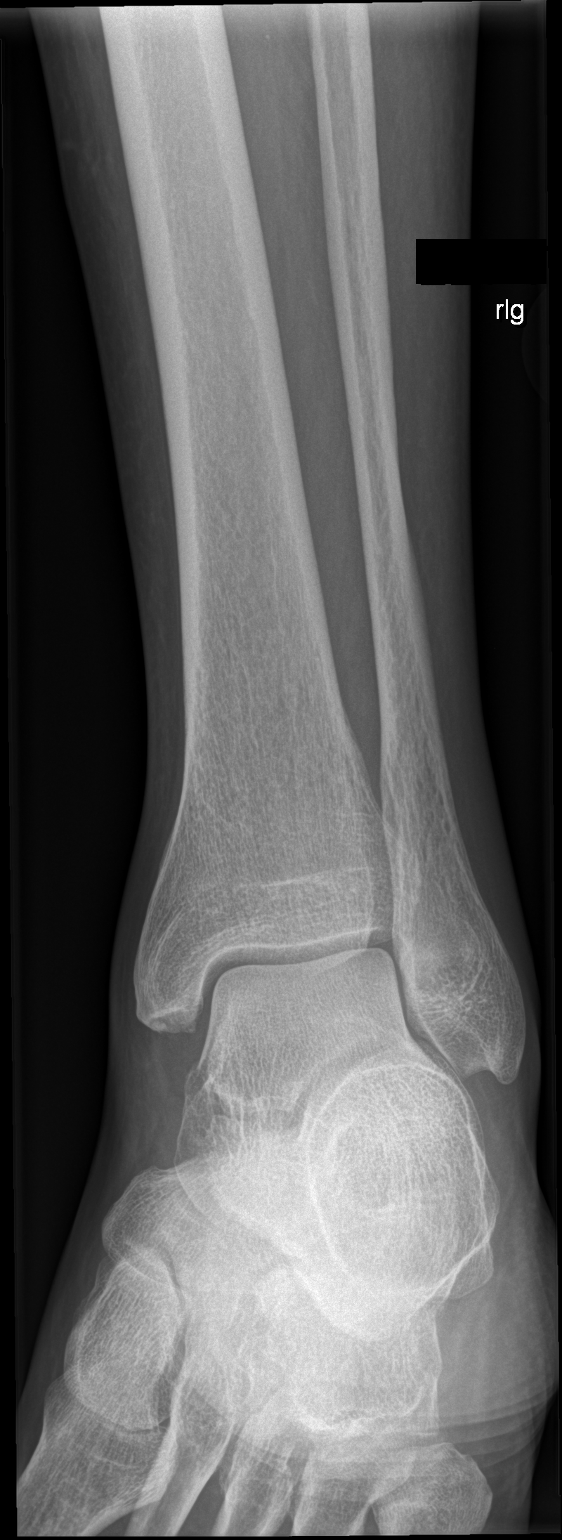

[x ankle lat left]
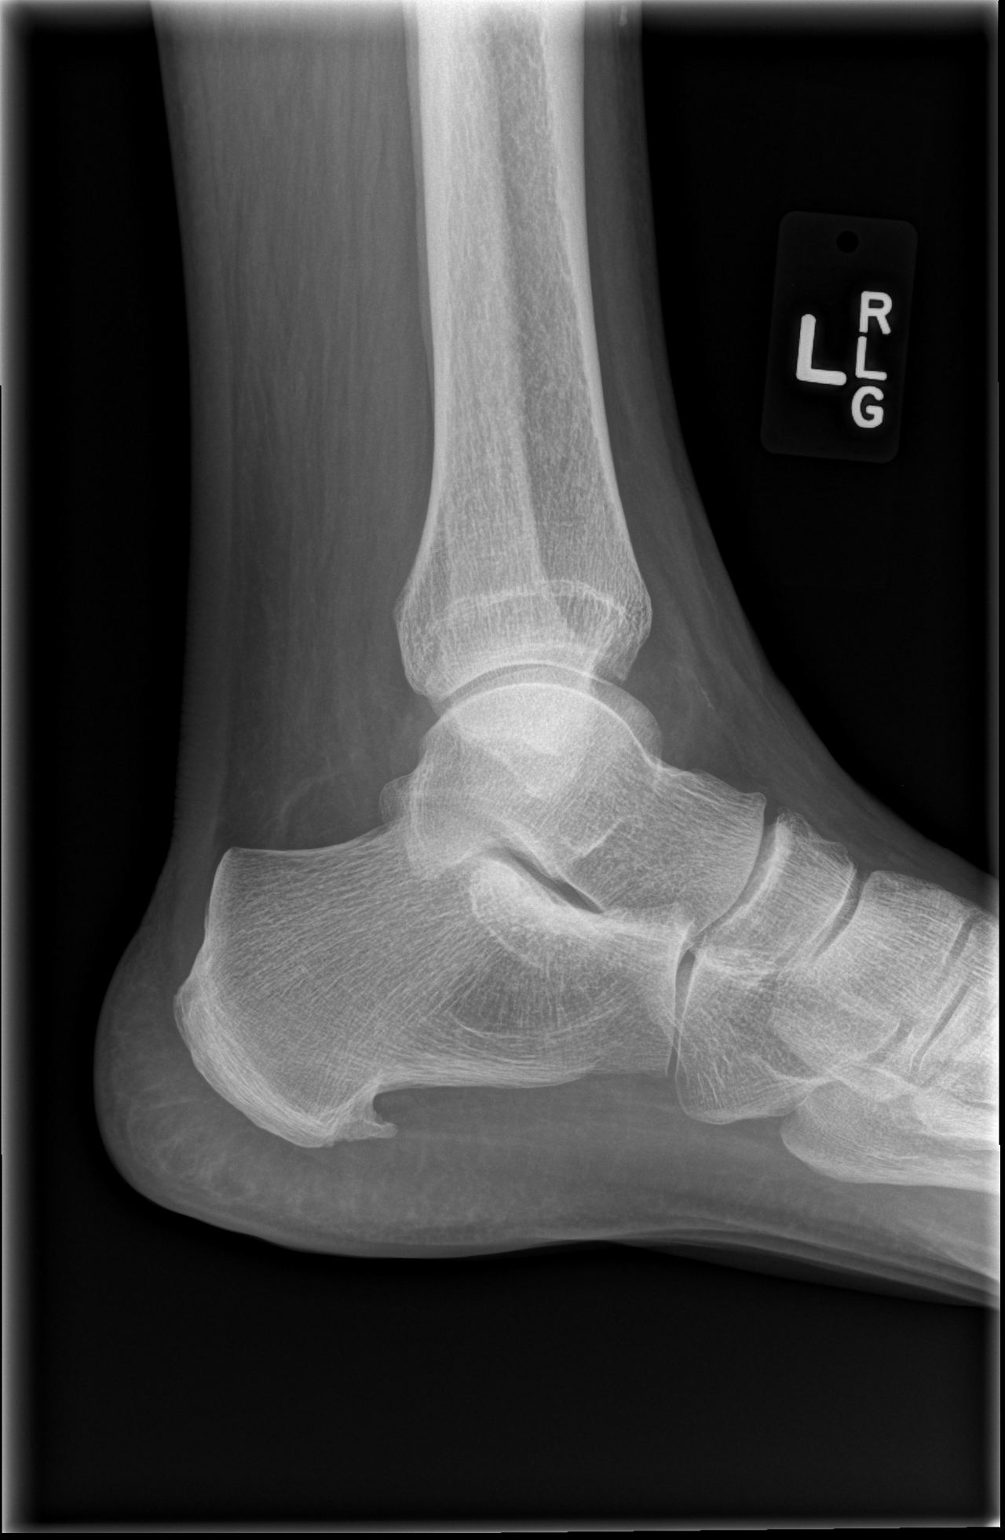

[3 of 3 positions shown; findings below may reference images not displayed]

FINDINGS: There is no evidence of fracture, dislocation, or joint effusion.
There is no evidence of arthropathy or other focal bone abnormality.
Slight lateral soft tissue swelling.
IMPRESSION: Slight lateral soft tissue swelling.  No fracture or dislocation.

## 2017-08-18 DIAGNOSIS — Z23 Encounter for immunization: Secondary | ICD-10-CM | POA: Diagnosis not present

## 2017-08-18 DIAGNOSIS — I743 Embolism and thrombosis of arteries of the lower extremities: Secondary | ICD-10-CM | POA: Diagnosis not present

## 2017-08-18 DIAGNOSIS — I10 Essential (primary) hypertension: Secondary | ICD-10-CM | POA: Diagnosis not present

## 2017-08-18 DIAGNOSIS — N182 Chronic kidney disease, stage 2 (mild): Secondary | ICD-10-CM | POA: Diagnosis not present

## 2017-08-24 ENCOUNTER — Emergency Department (HOSPITAL_COMMUNITY)
Admission: EM | Admit: 2017-08-24 | Discharge: 2017-08-24 | Disposition: A | Discharge status: Home / Self Care | Payer: Medicare Other | Attending: Emergency Medicine | Admitting: Emergency Medicine

## 2017-08-24 ENCOUNTER — Other Ambulatory Visit: Payer: Self-pay

## 2017-08-24 ENCOUNTER — Encounter (HOSPITAL_COMMUNITY): Payer: Self-pay | Admitting: *Deleted

## 2017-08-24 DIAGNOSIS — F17210 Nicotine dependence, cigarettes, uncomplicated: Secondary | ICD-10-CM | POA: Diagnosis not present

## 2017-08-24 DIAGNOSIS — H9190 Unspecified hearing loss, unspecified ear: Secondary | ICD-10-CM | POA: Insufficient documentation

## 2017-08-24 DIAGNOSIS — Z7982 Long term (current) use of aspirin: Secondary | ICD-10-CM | POA: Diagnosis not present

## 2017-08-24 DIAGNOSIS — Z79899 Other long term (current) drug therapy: Secondary | ICD-10-CM | POA: Diagnosis not present

## 2017-08-24 DIAGNOSIS — I10 Essential (primary) hypertension: Secondary | ICD-10-CM | POA: Insufficient documentation

## 2017-08-24 DIAGNOSIS — J3489 Other specified disorders of nose and nasal sinuses: Secondary | ICD-10-CM | POA: Diagnosis not present

## 2017-08-24 DIAGNOSIS — H60331 Swimmer's ear, right ear: Secondary | ICD-10-CM

## 2017-08-24 DIAGNOSIS — H9211 Otorrhea, right ear: Secondary | ICD-10-CM | POA: Diagnosis present

## 2017-08-24 HISTORY — DX: Unspecified hearing loss, unspecified ear: H91.90

## 2017-08-24 MED ORDER — NEOMYCIN-POLYMYXIN-HC 3.5-10000-1 OT SUSP
3.0000 [drp] | Freq: Once | OTIC | Status: AC
  Administered 2017-08-24: 3 [drp] via OTIC
  Filled 2017-08-24: qty 10

## 2017-08-24 NOTE — Discharge Instructions (Signed)
Apply 3 drops of antibiotic drop to your right ear every 6 hours and lie with your right ear towards the ceiling for 5 minutes to allow maximum absorption into your ear canal.  This medication can be used for 10 days.  As discussed call Dr. Benjamine Mola for recheck of your ear next week if your symptoms are not improving.  He is here in [place] on Thursdays.  I would minimize use of your right hearing aid until your symptoms are better.

## 2017-08-24 NOTE — ED Triage Notes (Signed)
Wears hearing aids, states he went to have his aids checked yesterday and he was informed he had an infection in his right ear.

## 2017-08-24 NOTE — ED Provider Notes (Signed)
[facility] EMERGENCY DEPARTMENT Provider Note   CSN: [id number] Arrival date & time: 08/24/17  1253     History   Chief Complaint Chief Complaint  Patient presents with  . Hearing Loss    HPI Jorge Jefferson is a 71 y.o. male presenting with muffled hearing and white discharge from his right ear canal which is been present for the past several days.  He wears a hearing aid in his right ear and recently had an adjustment at [facility] as he was also having a whistling noise coming from the device.  At that time he was told there appeared to be purulent drainage in the ear which may be affecting the hearing aid.  He denies significant pain and has had no fevers or chills.  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  He was followed by an ENT provider in [place] for this problem who is no longer available for follow-up care.  Patient has had no treatments prior to arrival.  HPI  Past Medical History:  Diagnosis Date  . Hearing loss   . Hypertension     There are no active problems to display for this patient.   History reviewed. No pertinent surgical history.     Home Medications    Prior to Admission medications   Medication Sig Start Date End Date Taking? Authorizing Provider  amLODipine (NORVASC) 10 MG tablet Take 10 mg by mouth daily. 04/03/15   [provider]  aspirin EC 81 MG tablet Take 81 mg by mouth daily.    [provider]  cilostazol (PLETAL) 100 MG tablet Take 100 mg by mouth daily. 03/02/17   [provider]  cloNIDine (CATAPRES) 0.1 MG tablet  04/15/15   [provider]  HYDROcodone-acetaminophen (NORCO/VICODIN) 5-325 MG tablet Take 1 tablet by mouth every 6 (six) hours as needed for severe pain. 05/20/16   Duffy Bruce, MD  Multiple Vitamins-Minerals (CENTRUM ADULTS) TABS Take 1 tablet by mouth daily.    [provider]  neomycin-polymyxin-hydrocortisone (CORTISPORIN) otic solution 1-2 drops  to the toe after soaking twice daily 08/09/14   Tyson Dense T, DPM    Family History No family history on file.  Social History Social History   Tobacco Use  . Smoking status: Current Every Day Smoker    Packs/day: 0.50    Types: Cigarettes  . Smokeless tobacco: Never Used  Substance Use Topics  . Alcohol use: Yes    Comment: socially   . Drug use: No     Allergies   Patient has no known allergies.   Review of Systems Review of Systems  Constitutional: Negative for chills and fever.  HENT: Positive for ear discharge and hearing loss. Negative for congestion, ear pain, rhinorrhea, sinus pressure, sore throat, trouble swallowing and voice change.   Eyes: Negative for discharge.  Respiratory: Negative for cough, shortness of breath, wheezing and stridor.   Cardiovascular: Negative for chest pain.  Gastrointestinal: Negative for abdominal pain.  Genitourinary: Negative.      Physical Exam Updated Vital Signs BP 129/88 (BP Location: Right Arm)   Pulse (!) 104   Temp 98.2 F (36.8 C) (Oral)   Resp 17   Ht 6' 9.5" (2.07 m)   Wt 120.3 kg (265 lb 2 oz)   SpO2 97%   BMI 28.06 kg/m   Physical Exam  Constitutional: He is oriented to person, place, and time. He appears well-developed and well-nourished.  HENT:  Head: Normocephalic and atraumatic.  Right Ear: External ear normal.  Left Ear: Tympanic membrane, external ear and ear canal normal.  Nose: Mucosal edema and rhinorrhea present.  Mouth/Throat: Uvula is midline, oropharynx is clear and moist and mucous membranes are normal. No oropharyngeal exudate, posterior oropharyngeal edema, posterior oropharyngeal erythema or tonsillar abscesses.  TM tube visualized and patent left ear.  Right ear canal has white copious drainage deep within the ear canal.  Unable to visualize the TM or the tympanostomy tube in the right ear.  Eyes: Conjunctivae are normal.  Cardiovascular: Normal rate.  Pulmonary/Chest: Effort normal.    Musculoskeletal: Normal range of motion.  Neurological: He is alert and oriented to person, place, and time.  Skin: Skin is warm and dry. No rash noted.  Psychiatric: He has a normal mood and affect.     ED Treatments / Results  Labs (all labs ordered are listed, but only abnormal results are displayed) Labs Reviewed - No data to display  EKG  EKG Interpretation None       Radiology No results found.  Procedures Procedures (including critical care time)  Medications Ordered in ED Medications  neomycin-polymyxin-hydrocortisone (CORTISPORIN) OTIC (EAR) suspension 3 drop (3 drops Right EAR Given 08/24/17 1406)     Initial Impression / Assessment and Plan / ED Course  I have reviewed the triage vital signs and the nursing notes.  Pertinent labs & imaging results that were available during my care of the patient were reviewed by me and considered in my medical decision making (see chart for details).     Patient was provided with Cortisporin otic suspension.  He was advised to minimize use of his hearing aid while this infection clears.  He will be referred to Dr. Benjamine Mola for follow-up care if his symptoms persist or worsen.  Final Clinical Impressions(s) / ED Diagnoses   Final diagnoses:  Acute swimmer's ear of right side    ED Discharge Orders    None       Landis Martins 08/24/17 1444    Noemi Chapel, MD 08/25/17 1501

## 2017-09-04 ENCOUNTER — Encounter (HOSPITAL_COMMUNITY): Payer: Self-pay | Admitting: Emergency Medicine

## 2017-09-04 ENCOUNTER — Emergency Department (HOSPITAL_COMMUNITY): Payer: Medicare Other

## 2017-09-04 ENCOUNTER — Emergency Department (HOSPITAL_COMMUNITY)
Admission: EM | Admit: 2017-09-04 | Discharge: 2017-09-04 | Disposition: A | Discharge status: Home / Self Care | Payer: Medicare Other | Attending: Emergency Medicine | Admitting: Emergency Medicine

## 2017-09-04 DIAGNOSIS — L03116 Cellulitis of left lower limb: Secondary | ICD-10-CM | POA: Insufficient documentation

## 2017-09-04 DIAGNOSIS — Z7902 Long term (current) use of antithrombotics/antiplatelets: Secondary | ICD-10-CM | POA: Diagnosis not present

## 2017-09-04 DIAGNOSIS — N189 Chronic kidney disease, unspecified: Secondary | ICD-10-CM | POA: Diagnosis not present

## 2017-09-04 DIAGNOSIS — Z79891 Long term (current) use of opiate analgesic: Secondary | ICD-10-CM | POA: Insufficient documentation

## 2017-09-04 DIAGNOSIS — M79672 Pain in left foot: Secondary | ICD-10-CM | POA: Diagnosis not present

## 2017-09-04 DIAGNOSIS — I1310 Hypertensive heart and chronic kidney disease without heart failure, with stage 1 through stage 4 chronic kidney disease, or unspecified chronic kidney disease: Secondary | ICD-10-CM | POA: Diagnosis not present

## 2017-09-04 DIAGNOSIS — M7989 Other specified soft tissue disorders: Secondary | ICD-10-CM | POA: Diagnosis not present

## 2017-09-04 DIAGNOSIS — F17210 Nicotine dependence, cigarettes, uncomplicated: Secondary | ICD-10-CM | POA: Diagnosis not present

## 2017-09-04 HISTORY — DX: Disorder of kidney and ureter, unspecified: N28.9

## 2017-09-04 LAB — CBC
HCT: 47.4 % (ref 39.0–52.0)
HEMOGLOBIN: 16.2 g/dL (ref 13.0–17.0)
MCH: 31.7 pg (ref 26.0–34.0)
MCHC: 34.2 g/dL (ref 30.0–36.0)
MCV: 92.8 fL (ref 78.0–100.0)
Platelets: 182 10*3/uL (ref 150–400)
RBC: 5.11 MIL/uL (ref 4.22–5.81)
RDW: 13.6 % (ref 11.5–15.5)
WBC: 7.2 10*3/uL (ref 4.0–10.5)

## 2017-09-04 LAB — BASIC METABOLIC PANEL
Anion gap: 6 (ref 5–15)
BUN: 15 mg/dL (ref 6–20)
CHLORIDE: 108 mmol/L (ref 101–111)
CO2: 23 mmol/L (ref 22–32)
Calcium: 9.5 mg/dL (ref 8.9–10.3)
Creatinine, Ser: 1.26 mg/dL — ABNORMAL HIGH (ref 0.61–1.24)
GFR calc Af Amer: >60 mL/min (ref >60)
GFR calc non Af Amer: 56 mL/min — ABNORMAL LOW (ref >60)
GLUCOSE: 115 mg/dL — AB (ref 65–99)
Potassium: 4.1 mmol/L (ref 3.5–5.1)
Sodium: 137 mmol/L (ref 135–145)

## 2017-09-04 MED ORDER — DOXYCYCLINE HYCLATE 100 MG PO CAPS: 100.0000 mg | ORAL_CAPSULE | Freq: Two times a day (BID) | ORAL | 0 refills | Status: DC

## 2017-09-04 MED ORDER — BACITRACIN ZINC 500 UNIT/GM EX OINT
TOPICAL_OINTMENT | Freq: Once | CUTANEOUS | Status: AC
  Administered 2017-09-04: 1 via TOPICAL
  Filled 2017-09-04: qty 0.9

## 2017-09-04 MED ORDER — VANCOMYCIN HCL IN DEXTROSE 1-5 GM/200ML-% IV SOLN
1000.0000 mg | Freq: Once | INTRAVENOUS | Status: AC
  Administered 2017-09-04: 1000 mg via INTRAVENOUS
  Filled 2017-09-04: qty 200

## 2017-09-04 NOTE — ED Provider Notes (Signed)
Medical screening examination/treatment/procedure(s) were conducted as a shared visit with non-physician practitioner(s) and myself.  I personally evaluated the patient during the encounter.   EKG Interpretation None       Patient seen by me along with physician assistant.  Patient with redness pain and swelling to the left foot for 2 days.  Has red streaking on the top of the foot and fair amount of redness to the distal forefoot.  Patient has a lesion that is dry at the base of the toes middle part of the foot.  Most likely source of infection.  Patient was not aware that he had a problem there.  Patient is not a dialysis patient.  Patient does have decreased sensation in that area.  Past medical history is significant for hypertension.  Patient has follow-up with podiatry this week.  X-rays of the foot showed no bony involvement.  We will treat his cellulitis with 1 dose of.  Then continue him on doxycycline.  Basic labs ordered.  Patient nontoxic no acute distress.  There is no red streaking above the foot area.  Abdomen is soft and nontender lungs clear heart regular.     Fredia Sorrow, MD 09/04/17 1431

## 2017-09-04 NOTE — ED Provider Notes (Signed)
[facility] EMERGENCY DEPARTMENT Provider Note   CSN: [id number] Arrival date & time: 09/04/17  1048     History   Chief Complaint Chief Complaint  Patient presents with  . Foot Pain    HPI  Jorge Jefferson is a 71 y.o. Male with a history of hypertension, and previous acute renal failure, presents complaining of pain, redness and swelling of his left foot for 2 days.  He reports his wife noted a wound on the bottom of the foot when she took a look at it this morning, after he noticed some blood on his sock.  Patient denies fevers or chills.  Reports he feels like pain swelling and redness has gotten worse over the past 2 days.  He is not taken anything at home for the pain prior to arrival.  He reports he has decreased sensation in both feet ever since he had acute renal failure several years ago, no history of diabetes.  Denies any new numbness or tingling, he has been able to walk on it, reports is very uncomfortable.  He is able to move all of his toes.  He reports he has an appointment this coming Wednesday with his podiatrist to work on his toenails.      Past Medical History:  Diagnosis Date  . Hearing loss   . Hypertension   . Renal disorder     There are no active problems to display for this patient.   History reviewed. No pertinent surgical history.     Home Medications    Prior to Admission medications   Medication Sig Start Date End Date Taking? Authorizing Provider  amLODipine (NORVASC) 10 MG tablet Take 10 mg by mouth daily. 04/03/15   [provider]  aspirin EC 81 MG tablet Take 81 mg by mouth daily.    [provider]  cilostazol (PLETAL) 100 MG tablet Take 100 mg by mouth daily. 03/02/17   [provider]  cloNIDine (CATAPRES) 0.1 MG tablet  04/15/15   [provider]  HYDROcodone-acetaminophen (NORCO/VICODIN) 5-325 MG tablet Take 1 tablet by mouth every 6 (six) hours as needed for severe pain. 05/20/16   Duffy Bruce, MD  Multiple Vitamins-Minerals (CENTRUM ADULTS) TABS Take 1 tablet by mouth daily.    [provider]  neomycin-polymyxin-hydrocortisone (CORTISPORIN) otic solution 1-2 drops to the toe after soaking twice daily 08/09/14   Tyson Dense T, DPM    Family History No family history on file.  Social History Social History   Tobacco Use  . Smoking status: Current Every Day Smoker    Packs/day: 0.50    Types: Cigarettes  . Smokeless tobacco: Never Used  Substance Use Topics  . Alcohol use: Yes    Comment: socially   . Drug use: No     Allergies   Patient has no known allergies.   Review of Systems Review of Systems  Constitutional: Negative for chills and fever.  Respiratory: Negative for chest tightness and shortness of breath.   Cardiovascular: Negative for chest pain and leg swelling.  Gastrointestinal: Negative for nausea and vomiting.  Musculoskeletal: Positive for arthralgias (L foot pain).  Skin: Positive for color change and wound.  Neurological: Negative for weakness and numbness.  All other systems reviewed and are negative.    Physical Exam Updated Vital Signs BP (!) 157/88   Pulse 86   Temp 98.5 F (36.9 C)   Resp 18   Ht 6' 9.5" (2.07 m)   Wt 120.2  kg (265 lb)   SpO2 96%   BMI 28.05 kg/m   Physical Exam  Constitutional: He appears well-developed and well-nourished. No distress.  HENT:  Head: Normocephalic and atraumatic.  Eyes: Right eye exhibits no discharge. Left eye exhibits no discharge.  Cardiovascular: Normal rate, regular rhythm, normal heart sounds and intact distal pulses.  Pulmonary/Chest: Effort normal and breath sounds normal. No respiratory distress.  Abdominal: Soft. Bowel sounds are normal. He exhibits no distension. There is no tenderness.  Musculoskeletal:  Tenderness over distal forefoot, with redness and swelling, streaking extending across foot (does not extend beyond foot), dry, non-draining 1.5 cm wound at base  of 2nd and 3rd toes. DP and TP pulses 2+, decreased sensation over plantar surface of foot, 5/5 dorsiflexion and plantarflexion  Neurological: He is alert. Coordination normal.  Skin: Skin is warm and dry. Capillary refill takes less than 2 seconds. He is not diaphoretic.  Psychiatric: He has a normal mood and affect. His behavior is normal.  Nursing note and vitals reviewed.       ED Treatments / Results  Labs (all labs ordered are listed, but only abnormal results are displayed) Labs Reviewed  BASIC METABOLIC PANEL - Abnormal; Notable for the following components:      Result Value   Glucose, Bld 115 (*)    Creatinine, Ser 1.26 (*)    GFR calc non Af Amer 56 (*)    All other components within normal limits  CBC    EKG  EKG Interpretation None       Radiology Dg Foot Complete Left  Result Date: 09/04/2017 CLINICAL DATA:  Left foot pain, swelling. EXAM: LEFT FOOT - COMPLETE 3+ VIEW COMPARISON:  03/19/2017 FINDINGS: Plantar calcaneal spur. No acute bony abnormality. Specifically, no fracture, subluxation, or dislocation. Soft tissues are intact. IMPRESSION: No acute bony abnormality. Electronically Signed   By: Rolm Baptise M.D.   On: 09/04/2017 12:27    Procedures Procedures (including critical care time)  Medications Ordered in ED Medications  vancomycin (VANCOCIN) IVPB 1000 mg/200 mL premix (0 mg Intravenous Stopped 09/04/17 1438)  bacitracin ointment (1 application Topical Given 09/04/17 1518)     Initial Impression / Assessment and Plan / ED Course  I have reviewed the triage vital signs and the nursing notes.  Pertinent labs & imaging results that were available during my care of the patient were reviewed by me and considered in my medical decision making (see chart for details).  Pt presents with 2 days of pain, redness and swelling of the left foot, with wound on the bottom of the foot his wife noticed today. On initial eval is non-toxic appearing and in no  acute distress, vitals are normal. Pt has redness and swelling on the dorsal forefoot with streaking across top of foot concerning for infection, streaking does not extend past foot. There is a dry, non-draining wound on the foot ball of the foot, likely the source of the infection. Pt has decreased sensation of plantar surface of foot, not a new finding. Distal pulses 2+ with good cap refill. X-ray shows no evidence of bone involvement. Will treat cellulitis with 1 dose of vancomycin in ED, and obtain basic labs.  Labs show no leukocytosis, Cr 1.26, only comparison available 1.32 from 8 yrs ago, labs otherwise unremarkable. Pt to follow up with PCP to have Cr re-checked in 1 week. Wound cleaned and dressed with bacitracin ointment and bandage. Pt will be discharged home with doxycycline, he has an appointment  with his podiatrist on Wednesday. Strict return precautions regarding worsening of infection discussed. Pt expresses understanding and agrees with plan.  Patient discussed with Dr. Rogene Houston, who saw patient as well and agrees with plan.   Final Clinical Impressions(s) / ED Diagnoses   Final diagnoses:  Foot pain, left  Cellulitis of left lower extremity    ED Discharge Orders        Ordered    doxycycline (VIBRAMYCIN) 100 MG capsule  2 times daily     09/04/17 1457           Jacqlyn Larsen, [place] 09/04/17 2110    Fredia Sorrow, MD 09/06/17 1747

## 2017-09-04 NOTE — ED Notes (Signed)
To radiology

## 2017-09-04 NOTE — ED Notes (Signed)
Pt  Presents with a wound to his L foot - He has what appears to be a plantars wart with the surrounding skin separating from the area of the wart appearing lesion  The wound is near the plantar surface of the 2nd metatarsal head

## 2017-09-04 NOTE — ED Notes (Signed)
Awaiting eval  

## 2017-09-04 NOTE — ED Triage Notes (Signed)
Pt c/o pain and swelling to left foot x 2 days. Pt also reports laceration to bottom of foot.

## 2017-09-04 NOTE — ED Notes (Signed)
Meal provided for pt and spouse  

## 2017-09-04 NOTE — Discharge Instructions (Signed)
Complete entire course of antibiotics as directed.  Keep wound covered with a bandage and keep foot elevated as much as possible.  If redness continues to spread despite these antibiotics or you have worsening pain or swelling please return to the ED immediately for reevaluation.  Otherwise please follow-up with your podiatrist as planned on Wednesday and discuss this foot wound with him.  Please also follow-up with your primary care doctor later this week to have your kidney function rechecked.

## 2017-09-07 ENCOUNTER — Other Ambulatory Visit: Payer: Self-pay

## 2017-09-07 DIAGNOSIS — I739 Peripheral vascular disease, unspecified: Secondary | ICD-10-CM

## 2017-09-08 ENCOUNTER — Ambulatory Visit (INDEPENDENT_AMBULATORY_CARE_PROVIDER_SITE_OTHER): Payer: Medicare Other | Admitting: Podiatry

## 2017-09-08 ENCOUNTER — Encounter: Payer: Self-pay | Admitting: Podiatry

## 2017-09-08 DIAGNOSIS — B351 Tinea unguium: Secondary | ICD-10-CM

## 2017-09-08 DIAGNOSIS — M79676 Pain in unspecified toe(s): Secondary | ICD-10-CM | POA: Diagnosis not present

## 2017-09-08 DIAGNOSIS — L97521 Non-pressure chronic ulcer of other part of left foot limited to breakdown of skin: Secondary | ICD-10-CM | POA: Diagnosis not present

## 2017-09-08 NOTE — Progress Notes (Signed)
Patient ID: [name], male   DOB: 14-Aug-1946, 71 y.o.   MRN: [id number] Complaint:  Visit Type: Patient returns to my office for continued preventative foot care services. Complaint: Patient states" my nails have grown long and thick and become painful to walk and wear shoes" . He presents for preventative foot care services. No changes to ROS.  He says he was recently seen at the emergency Department for an open skin lesion which was diagnosed as an infection.  He was treated with doxycycline by mouth to help to eliminate the infection.  He was told to follow-up with myself for further evaluation of this open skin lesion left forefoot.  [name] He says that he believes that his shoelace got caught under his foot, which cause this lesion to develop.  [name] He presents the office today for preventative foot care services and an evaluation of this open skin lesion.  Podiatric Exam: Vascular: dorsalis pedis and posterior tibial pulses are palpable bilateral. Capillary return is immediate. Temperature gradient is WNL. Skin turgor WNL  Sensorium: Normal Semmes Weinstein monofilament test. Normal tactile sensation bilaterally.  Nail Exam: Pt has thick disfigured discolored nails with subungual debris noted bilateral entire nail hallux through fifth toenails Ulcer Exam: There is 4 mm. X 4 mm. Sub 2 left foot.  Healing noted at the site of the ulcer with no evidence of any drainage, redness or swelling. Orthopedic Exam: Muscle tone and strength are WNL. No limitations in general ROM. 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 Skin: No Porokeratosis. No infection or ulcers.    Diagnosis:  Tinea unguium, Pain in right toe, pain in left toes Diabetic ulcer left foot.  Treatment & Plan Procedures and Treatment: Consent by patient was obtained for treatment procedures. The patient understood the discussion of treatment and procedures well. All questions were answered  thoroughly reviewed. Debridement of mycotic and hypertrophic toenails, 1 through 5 bilateral and clearing of subungual debris. No ulceration, no infection noted. Department of the ulcer was performed on the left forefoot.  This ulcer was bandaged with Neosporin and a dry sterile dressing.  He was told to continue his antibiotics until finished.  Home soaking instructions with bandaging to follow the soaks.  RTC if skin lesion is painful. Return Visit-Office Procedure: Patient instructed to return to the office for a follow up visit 3 months for continued evaluation and treatment.  Gardiner Barefoot DPM

## 2017-09-13 ENCOUNTER — Ambulatory Visit (INDEPENDENT_AMBULATORY_CARE_PROVIDER_SITE_OTHER): Payer: Medicare Other | Admitting: Otolaryngology

## 2017-09-13 DIAGNOSIS — H6983 Other specified disorders of Eustachian tube, bilateral: Secondary | ICD-10-CM

## 2017-09-13 DIAGNOSIS — H7203 Central perforation of tympanic membrane, bilateral: Secondary | ICD-10-CM

## 2017-09-13 DIAGNOSIS — H903 Sensorineural hearing loss, bilateral: Secondary | ICD-10-CM

## 2017-10-28 ENCOUNTER — Ambulatory Visit (INDEPENDENT_AMBULATORY_CARE_PROVIDER_SITE_OTHER): Payer: Medicare Other | Admitting: Vascular Surgery

## 2017-10-28 ENCOUNTER — Ambulatory Visit (HOSPITAL_COMMUNITY)
Admission: RE | Admit: 2017-10-28 | Discharge: 2017-10-28 | Disposition: A | Discharge status: Home / Self Care | Payer: Medicare Other | Source: Ambulatory Visit | Attending: Vascular Surgery | Admitting: Vascular Surgery

## 2017-10-28 ENCOUNTER — Encounter: Payer: Self-pay | Admitting: Vascular Surgery

## 2017-10-28 VITALS — BP 145/89 | HR 85 | Resp 18 | Ht >= 80 in | Wt 260.0 lb

## 2017-10-28 DIAGNOSIS — I739 Peripheral vascular disease, unspecified: Secondary | ICD-10-CM | POA: Diagnosis not present

## 2017-10-28 MED ORDER — SIMVASTATIN 10 MG PO TABS: 10.0000 mg | ORAL_TABLET | Freq: Every day | ORAL | 12 refills | Status: DC

## 2017-10-28 NOTE — Progress Notes (Signed)
Referring Physician: Dr Iona Beard  Patient name: Jorge Jefferson MRN: [id number] DOB: 1946/06/19 Sex: male  REASON FOR CONSULT: left foot and leg pain with PAD  HPI: Jorge Jefferson is a 72 y.o. male with a several month history of pain numbness and tingling in his left foot.  He also has some numbness in the right foot but this is less than the left.  He also experiences claudication symptoms in the left calf after walking 1 block.  The pain in his left calf will subside after about 5 minutes of rest.  He is able to go 1-2 blocks.  He also complains occasionally of some pain in his right hip.  He currently smokes about 1 pack of cigarettes per day.  He is going to try to quit.  Greater than 3 minutes today spent regarding smoking cessation counseling.  He is on aspirin daily and Pletal.  He is not currently on a statin.  He states he did have an episode of renal failure requiring temporary dialysis in the past.  His most recent serum creatinine was 1.3 in November 2018.  He also had a nonhealing wound on his foot recently.  This was able to heal with local wound care.  He has tried Chantix in the past.  This did not work for him.  Other medical problems include hypertension which is currently controlled.  Past Medical History:  Diagnosis Date  . Hearing loss   . Hypertension   . Renal disorder    History reviewed. No pertinent surgical history.  History reviewed. No pertinent family history.  SOCIAL HISTORY: Social History   Socioeconomic History  . Marital status: Married    Spouse name: Not on file  . Number of children: Not on file  . Years of education: Not on file  . Highest education level: Not on file  Social Needs  . Financial resource strain: Not on file  . Food insecurity - worry: Not on file  . Food insecurity - inability: Not on file  . Transportation needs - medical: Not on file  . Transportation needs - non-medical: Not on file  Occupational History  . Not  on file  Tobacco Use  . Smoking status: Current Every Day Smoker    Packs/day: 0.50    Years: 30.00    Pack years: 15.00    Types: Cigarettes  . Smokeless tobacco: Never Used  Substance and Sexual Activity  . Alcohol use: Yes    Comment: socially   . Drug use: No  . Sexual activity: Not on file  Other Topics Concern  . Not on file  Social History Narrative   ** Merged History Encounter **        No Known Allergies  Current Outpatient Medications  Medication Sig Dispense Refill  . amLODipine (NORVASC) 10 MG tablet Take 10 mg by mouth daily.  2  . aspirin EC 81 MG tablet Take 81 mg by mouth daily.    . cilostazol (PLETAL) 100 MG tablet Take 100 mg by mouth daily.  3  . cloNIDine (CATAPRES) 0.1 MG tablet     . doxycycline (VIBRAMYCIN) 100 MG capsule Take 1 capsule (100 mg total) 2 (two) times daily by mouth. One po bid x 7 days 14 capsule 0  . HYDROcodone-acetaminophen (NORCO/VICODIN) 5-325 MG tablet Take 1 tablet by mouth every 6 (six) hours as needed for severe pain. 8 tablet 0  . Multiple Vitamins-Minerals (CENTRUM ADULTS) TABS Take 1 tablet  by mouth daily.    [name] neomycin-polymyxin-hydrocortisone (CORTISPORIN) otic solution 1-2 drops to the toe after soaking twice daily 10 mL 1   No current facility-administered medications for this visit.     ROS:   General:  No weight loss, Fever, chills  HEENT: No recent headaches, no nasal bleeding, no visual changes, no sore throat  Neurologic: No dizziness, blackouts, seizures. No recent symptoms of stroke or mini- stroke. No recent episodes of slurred speech, or temporary blindness.  Cardiac: No recent episodes of chest pain/pressure, no shortness of breath at rest.  No shortness of breath with exertion.  Denies history of atrial fibrillation or irregular heartbeat  Vascular: No history of rest pain in feet.  No history of claudication.  No history of non-healing ulcer, No history of DVT   Pulmonary: No home oxygen, no productive  cough, no hemoptysis,  No asthma or wheezing  Musculoskeletal:  [ ]  Arthritis, [ ]  Low back pain,  [ ]  Joint pain  Hematologic:No history of hypercoagulable state.  No history of easy bleeding.  No history of anemia  Gastrointestinal: No hematochezia or melena,  No gastroesophageal reflux, no trouble swallowing  Urinary: [ ]  chronic Kidney disease, [ ]  on HD - [ ]  MWF or [ ]  TTHS, [ ]  Burning with urination, [ ]  Frequent urination, [ ]  Difficulty urinating;   Skin: No rashes  Psychological: No history of anxiety,  No history of depression   Physical Examination  Vitals:   10/28/17 0839 10/28/17 0842  BP: (!) 149/89 (!) 145/89  Pulse: 85   Resp: 18   SpO2: 96%   Weight: 260 lb (117.9 kg)   Height: 6' 9.5" (2.07 m)     Body mass index is 27.52 kg/m.  General:  Alert and oriented, no acute distress HEENT: Normal Neck: No bruit or JVD Pulmonary: Clear to auscultation bilaterally Cardiac: Regular Rate and Rhythm without murmur Abdomen: Soft, non-tender, non-distended, no mass Skin: No rash Extremity Pulses:  2+ radial, brachial, femoral, absent right and left dorsalis pedis, 1+ right absent left posterior tibial pulses Musculoskeletal: No deformity or edema  Neurologic: Upper and lower extremity motor 5/5 and symmetric  DATA:  Patient had bilateral ABIs performed today which I reviewed and interpreted.  Right side was greater than 1 left was 0.8.  I also reviewed the previous arterial duplex exam dated December 2017 which showed a left mid superficial femoral artery occlusion.  ASSESSMENT: Patient with peripheral arterial disease symptomatic in the left leg with mild to moderate claudication symptoms.  He also probably has a component of some bilateral peripheral neuropathy.  Currently is not at risk of limb loss.  I discussed with the patient today a conservative program of walking smoking cessation and addition of a statin drug.  I also discussed with him the possibility of  an arteriogram and possible intervention on his left leg to improve his symptoms.  He currently has opted for the conservative management program.   PLAN: #1 patient will try to quit smoking  #2 patient was started on simvastatin 10 mg once daily  #3 patient will continue his aspirin  #4 patient will try walking program of 30 minutes daily.    He will follow-up with me in 3 months time for repeat arterial duplex and ABIs   Ruta Hinds, MD [facility]: [phone] Pager: [phone]

## 2017-11-02 NOTE — Addendum Note (Signed)
Addended by: Lianne Cure A on: 11/02/2017 02:45 PM   Modules accepted: Orders

## 2017-12-08 ENCOUNTER — Ambulatory Visit (INDEPENDENT_AMBULATORY_CARE_PROVIDER_SITE_OTHER): Payer: Medicare Other | Admitting: Podiatry

## 2017-12-08 ENCOUNTER — Encounter: Payer: Self-pay | Admitting: Podiatry

## 2017-12-08 DIAGNOSIS — M79676 Pain in unspecified toe(s): Secondary | ICD-10-CM

## 2017-12-08 DIAGNOSIS — B351 Tinea unguium: Secondary | ICD-10-CM

## 2017-12-08 NOTE — Progress Notes (Addendum)
Patient ID: Jorge Jefferson, male   DOB: 02-Oct-1946, 72 y.o.   MRN: [id number] Complaint:  Visit Type: Patient returns to my office for continued preventative foot care services. Complaint: Patient states" my nails have grown long and thick and become painful to walk and wear shoes" . He presents for preventative foot care services. No changes to ROS.   Podiatric Exam: Vascular: dorsalis pedis and posterior tibial pulses are palpable bilateral. Capillary return is immediate. Temperature gradient is WNL. Skin turgor WNL  Sensorium: Normal Semmes Weinstein monofilament test. Normal tactile sensation bilaterally. Tingling from sural nerve left foot. Nail Exam: Pt has thick disfigured discolored nails with subungual debris noted bilateral entire nail hallux through fifth toenails Ulcer Exam: There is no evidence of ulcer or pre-ulcerative changes or infection. Orthopedic Exam: Muscle tone and strength are WNL. No limitations in general ROM. 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 Skin: No Porokeratosis. No infection or ulcers.    Diagnosis:  Tinea unguium, Pain in right toe, pain in left toes  Treatment & Plan Procedures and Treatment: Consent by patient was obtained for treatment procedures. The patient understood the discussion of treatment and procedures well. All questions were answered thoroughly reviewed. Debridement of mycotic and hypertrophic toenails, 1 through 5 bilateral and clearing of subungual debris. No ulceration, no infection noted.  ABN signed for 2019. Return Visit-Office Procedure: Patient instructed to return to the office for a follow up visit 3 months for continued evaluation and treatment.  Gardiner Barefoot DPM

## 2018-01-05 DIAGNOSIS — I10 Essential (primary) hypertension: Secondary | ICD-10-CM | POA: Diagnosis not present

## 2018-01-05 DIAGNOSIS — E785 Hyperlipidemia, unspecified: Secondary | ICD-10-CM | POA: Diagnosis not present

## 2018-01-05 DIAGNOSIS — I739 Peripheral vascular disease, unspecified: Secondary | ICD-10-CM | POA: Diagnosis not present

## 2018-01-05 DIAGNOSIS — H9191 Unspecified hearing loss, right ear: Secondary | ICD-10-CM | POA: Diagnosis not present

## 2018-01-19 DIAGNOSIS — K5900 Constipation, unspecified: Secondary | ICD-10-CM | POA: Diagnosis not present

## 2018-01-19 DIAGNOSIS — Z86010 Personal history of colonic polyps: Secondary | ICD-10-CM | POA: Diagnosis not present

## 2018-01-19 DIAGNOSIS — I10 Essential (primary) hypertension: Secondary | ICD-10-CM | POA: Diagnosis not present

## 2018-02-03 ENCOUNTER — Ambulatory Visit (HOSPITAL_COMMUNITY)
Admission: RE | Admit: 2018-02-03 | Discharge: 2018-02-03 | Disposition: A | Discharge status: Home / Self Care | Payer: Medicare Other | Source: Ambulatory Visit | Attending: Vascular Surgery | Admitting: Vascular Surgery

## 2018-02-03 ENCOUNTER — Other Ambulatory Visit: Payer: Self-pay

## 2018-02-03 ENCOUNTER — Ambulatory Visit (INDEPENDENT_AMBULATORY_CARE_PROVIDER_SITE_OTHER): Payer: Medicare Other | Admitting: Vascular Surgery

## 2018-02-03 ENCOUNTER — Encounter: Payer: Self-pay | Admitting: Vascular Surgery

## 2018-02-03 VITALS — BP 132/82 | HR 74 | Temp 98.6°F | Resp 16 | Ht >= 80 in | Wt 263.0 lb

## 2018-02-03 DIAGNOSIS — I70203 Unspecified atherosclerosis of native arteries of extremities, bilateral legs: Secondary | ICD-10-CM | POA: Diagnosis not present

## 2018-02-03 DIAGNOSIS — F17200 Nicotine dependence, unspecified, uncomplicated: Secondary | ICD-10-CM | POA: Diagnosis not present

## 2018-02-03 DIAGNOSIS — I739 Peripheral vascular disease, unspecified: Secondary | ICD-10-CM | POA: Insufficient documentation

## 2018-02-03 NOTE — Progress Notes (Signed)
Patient is a 72 year old male who returns for follow-up today.  He was last seen January 2019.  He was having problems primarily with his left leg with walking.  He still continues to complain of some pain that he describes more like a sprained ankle in the left leg.  This occurs with ambulation.  He denies rest pain.  He denies any tissue loss.  He intermittently takes cilostazol.  He is on aspirin and a statin.   Past Medical History:  Diagnosis Date  . Hearing loss   . Hypertension   . Renal disorder     Review of systems: He denies shortness of breath.  He denies chest pain.  Current Outpatient Medications on File Prior to Visit  Medication Sig Dispense Refill  . amLODipine (NORVASC) 10 MG tablet Take 10 mg by mouth daily.  2  . aspirin EC 81 MG tablet Take 81 mg by mouth daily.    . cilostazol (PLETAL) 100 MG tablet Take 100 mg by mouth daily.  3  . cloNIDine (CATAPRES) 0.1 MG tablet     . doxycycline (VIBRAMYCIN) 100 MG capsule Take 1 capsule (100 mg total) 2 (two) times daily by mouth. One po bid x 7 days 14 capsule 0  . HYDROcodone-acetaminophen (NORCO/VICODIN) 5-325 MG tablet Take 1 tablet by mouth every 6 (six) hours as needed for severe pain. 8 tablet 0  . Multiple Vitamins-Minerals (CENTRUM ADULTS) TABS Take 1 tablet by mouth daily.    [name] neomycin-polymyxin-hydrocortisone (CORTISPORIN) otic solution 1-2 drops to the toe after soaking twice daily 10 mL 1  . simvastatin (ZOCOR) 10 MG tablet Take 1 tablet (10 mg total) by mouth at bedtime. 30 tablet 12   No current facility-administered medications on file prior to visit.    Physical exam:  Vitals:   02/03/18 1414  BP: 132/82  Pulse: 74  Resp: 16  Temp: 98.6 F (37 C)  TempSrc: Oral  SpO2: 94%  Weight: 263 lb (119.3 kg)  Height: 6' 9.5" (2.07 m)    Extremities: 1+ femoral pulses bilaterally absent popliteal and pedal pulses  Skin: No open ulcer or rash  Chest: Clear to auscultation bilaterally  Cardiac: Regular  rate and rhythm  Neck: No carotid bruits  Data: Patient had a duplex ultrasound of the lower extremities today.  This showed a high-grade right SFA stenosis.  Left SFA is occluded.  ABI on the right was 0.9 left was 0.7  Assessment: Bilateral lower extremity peripheral arterial disease left greater than right.  Currently mildly symptomatic.  Patient currently is not interested in intervention and prefers conservative management with medication walking.  Plan: The patient will follow up with our nurse practitioner with repeat ABIs in 1 year.  Ruta Hinds, MD [facility] Office: [phone] Pager: [phone]

## 2018-02-17 DIAGNOSIS — K635 Polyp of colon: Secondary | ICD-10-CM | POA: Diagnosis not present

## 2018-02-17 DIAGNOSIS — D120 Benign neoplasm of cecum: Secondary | ICD-10-CM | POA: Diagnosis not present

## 2018-02-17 DIAGNOSIS — D123 Benign neoplasm of transverse colon: Secondary | ICD-10-CM | POA: Diagnosis not present

## 2018-02-17 DIAGNOSIS — K5730 Diverticulosis of large intestine without perforation or abscess without bleeding: Secondary | ICD-10-CM | POA: Diagnosis not present

## 2018-02-17 DIAGNOSIS — Z1211 Encounter for screening for malignant neoplasm of colon: Secondary | ICD-10-CM | POA: Diagnosis not present

## 2018-03-02 ENCOUNTER — Encounter: Payer: Self-pay | Admitting: Podiatry

## 2018-03-02 ENCOUNTER — Ambulatory Visit (INDEPENDENT_AMBULATORY_CARE_PROVIDER_SITE_OTHER): Payer: Medicare Other | Admitting: Podiatry

## 2018-03-02 DIAGNOSIS — M79676 Pain in unspecified toe(s): Secondary | ICD-10-CM

## 2018-03-02 DIAGNOSIS — D689 Coagulation defect, unspecified: Secondary | ICD-10-CM | POA: Diagnosis not present

## 2018-03-02 DIAGNOSIS — B351 Tinea unguium: Secondary | ICD-10-CM | POA: Diagnosis not present

## 2018-03-02 NOTE — Progress Notes (Signed)
Patient ID: Jorge Jefferson, male   DOB: 01/03/1946, 71 y.o.   MRN: [id number] Complaint:  Visit Type: Patient returns to my office for continued preventative foot care services. Complaint: Patient states" my nails have grown long and thick and become painful to walk and wear shoes" . He presents for preventative foot care services. No changes to ROS. Patient is taking pletal.  Podiatric Exam: Vascular: dorsalis pedis and posterior tibial pulses are palpable bilateral. Capillary return is immediate. Temperature gradient is WNL. Skin turgor WNL  Sensorium: Normal Semmes Weinstein monofilament test. Normal tactile sensation bilaterally. Tingling from sural nerve left foot. Nail Exam: Pt has thick disfigured discolored nails with subungual debris noted bilateral entire nail hallux through fifth toenails Ulcer Exam: There is no evidence of ulcer or pre-ulcerative changes or infection. Orthopedic Exam: Muscle tone and strength are WNL. No limitations in general ROM. 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 Skin: No Porokeratosis. No infection or ulcers.    Diagnosis:  Onychomycosis  Pain in right toe, pain in left toes  Treatment & Plan Procedures and Treatment: Consent by patient was obtained for treatment procedures. The patient understood the discussion of treatment and procedures well. All questions were answered thoroughly reviewed. Debridement of mycotic and hypertrophic toenails, 1 through 5 bilateral and clearing of subungual debris. No ulceration, no infection noted.  ABN signed for 2019. Return Visit-Office Procedure: Patient instructed to return to the office for a follow up visit 3 months for continued evaluation and treatment.  Kolby Schara DPM 

## 2018-04-06 DIAGNOSIS — I10 Essential (primary) hypertension: Secondary | ICD-10-CM | POA: Diagnosis not present

## 2018-04-06 DIAGNOSIS — E785 Hyperlipidemia, unspecified: Secondary | ICD-10-CM | POA: Diagnosis not present

## 2018-04-06 DIAGNOSIS — M25559 Pain in unspecified hip: Secondary | ICD-10-CM | POA: Diagnosis not present

## 2018-04-06 DIAGNOSIS — N182 Chronic kidney disease, stage 2 (mild): Secondary | ICD-10-CM | POA: Diagnosis not present

## 2018-05-04 ENCOUNTER — Ambulatory Visit (HOSPITAL_COMMUNITY)
Admission: RE | Admit: 2018-05-04 | Discharge: 2018-05-04 | Disposition: A | Discharge status: Home / Self Care | Payer: Medicare Other | Source: Ambulatory Visit | Attending: Family Medicine | Admitting: Family Medicine

## 2018-05-04 ENCOUNTER — Other Ambulatory Visit (HOSPITAL_COMMUNITY): Payer: Self-pay | Admitting: Family Medicine

## 2018-05-04 DIAGNOSIS — M25551 Pain in right hip: Secondary | ICD-10-CM | POA: Diagnosis not present

## 2018-05-04 DIAGNOSIS — M160 Bilateral primary osteoarthritis of hip: Secondary | ICD-10-CM | POA: Diagnosis not present

## 2018-05-04 DIAGNOSIS — M25559 Pain in unspecified hip: Secondary | ICD-10-CM | POA: Diagnosis present

## 2018-05-04 DIAGNOSIS — M25552 Pain in left hip: Secondary | ICD-10-CM | POA: Insufficient documentation

## 2018-06-08 ENCOUNTER — Encounter: Payer: Self-pay | Admitting: Podiatry

## 2018-06-08 ENCOUNTER — Ambulatory Visit (INDEPENDENT_AMBULATORY_CARE_PROVIDER_SITE_OTHER): Payer: Medicare Other | Admitting: Podiatry

## 2018-06-08 DIAGNOSIS — D689 Coagulation defect, unspecified: Secondary | ICD-10-CM | POA: Diagnosis not present

## 2018-06-08 DIAGNOSIS — M79676 Pain in unspecified toe(s): Secondary | ICD-10-CM

## 2018-06-08 DIAGNOSIS — B351 Tinea unguium: Secondary | ICD-10-CM

## 2018-06-08 NOTE — Progress Notes (Signed)
Patient ID: Jorge Jefferson, male   DOB: 09-Oct-1946, 72 y.o.   MRN: [id number] Complaint:  Visit Type: Patient returns to my office for continued preventative foot care services. Complaint: Patient states" my nails have grown long and thick and become painful to walk and wear shoes" . He presents for preventative foot care services. No changes to ROS. Patient is taking pletal.  Podiatric Exam: Vascular: dorsalis pedis and posterior tibial pulses are palpable bilateral. Capillary return is immediate. Temperature gradient is WNL. Skin turgor WNL  Sensorium: Normal Semmes Weinstein monofilament test. Normal tactile sensation bilaterally. Tingling from sural nerve left foot. Nail Exam: Pt has thick disfigured discolored nails with subungual debris noted bilateral entire nail hallux through fifth toenails Ulcer Exam: There is no evidence of ulcer or pre-ulcerative changes or infection. Orthopedic Exam: Muscle tone and strength are WNL. No limitations in general ROM. 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 Skin: No Porokeratosis. No infection or ulcers.    Diagnosis:  Onychomycosis  Pain in right toe, pain in left toes  Treatment & Plan Procedures and Treatment: Consent by patient was obtained for treatment procedures. The patient understood the discussion of treatment and procedures well. All questions were answered thoroughly reviewed. Debridement of mycotic and hypertrophic toenails, 1 through 5 bilateral and clearing of subungual debris. No ulceration, no infection noted.  ABN signed for 2019. Return Visit-Office Procedure: Patient instructed to return to the office for a follow up visit 3 months for continued evaluation and treatment.  Gardiner Barefoot DPM

## 2018-07-05 DIAGNOSIS — M25551 Pain in right hip: Secondary | ICD-10-CM | POA: Diagnosis not present

## 2018-07-05 DIAGNOSIS — Z6830 Body mass index (BMI) 30.0-30.9, adult: Secondary | ICD-10-CM | POA: Diagnosis not present

## 2018-07-05 DIAGNOSIS — E785 Hyperlipidemia, unspecified: Secondary | ICD-10-CM | POA: Diagnosis not present

## 2018-07-05 DIAGNOSIS — I10 Essential (primary) hypertension: Secondary | ICD-10-CM | POA: Diagnosis not present

## 2018-08-08 ENCOUNTER — Encounter (HOSPITAL_COMMUNITY): Payer: Self-pay | Admitting: Emergency Medicine

## 2018-08-08 ENCOUNTER — Emergency Department (HOSPITAL_COMMUNITY)
Admission: EM | Admit: 2018-08-08 | Discharge: 2018-08-08 | Disposition: A | Discharge status: Home / Self Care | Payer: Medicare Other | Attending: Emergency Medicine | Admitting: Emergency Medicine

## 2018-08-08 DIAGNOSIS — H9201 Otalgia, right ear: Secondary | ICD-10-CM | POA: Diagnosis present

## 2018-08-08 DIAGNOSIS — Z7982 Long term (current) use of aspirin: Secondary | ICD-10-CM | POA: Diagnosis not present

## 2018-08-08 DIAGNOSIS — I10 Essential (primary) hypertension: Secondary | ICD-10-CM | POA: Insufficient documentation

## 2018-08-08 DIAGNOSIS — Z79899 Other long term (current) drug therapy: Secondary | ICD-10-CM | POA: Diagnosis not present

## 2018-08-08 DIAGNOSIS — H6121 Impacted cerumen, right ear: Secondary | ICD-10-CM

## 2018-08-08 DIAGNOSIS — F17210 Nicotine dependence, cigarettes, uncomplicated: Secondary | ICD-10-CM | POA: Diagnosis not present

## 2018-08-08 MED ORDER — CIPROFLOXACIN-DEXAMETHASONE 0.3-0.1 % OT SUSP: 4.0000 [drp] | Freq: Two times a day (BID) | OTIC | 0 refills | Status: DC

## 2018-08-08 MED ORDER — HYDROGEN PEROXIDE 3 % EX SOLN
CUTANEOUS | Status: AC
  Filled 2018-08-08: qty 473

## 2018-08-08 MED ORDER — CIPROFLOXACIN-DEXAMETHASONE 0.3-0.1 % OT SUSP
2.0000 [drp] | Freq: Two times a day (BID) | OTIC | Status: DC
  Administered 2018-08-08: 2 [drp] via OTIC
  Filled 2018-08-08: qty 7.5

## 2018-08-08 NOTE — ED Notes (Addendum)
Currently waiting for pharmacy to bring down ear drops

## 2018-08-08 NOTE — ED Provider Notes (Signed)
[facility] EMERGENCY DEPARTMENT Provider Note   CSN: [id number] Arrival date & time: 08/08/18  1412     History   Chief Complaint Chief Complaint  Patient presents with  . Ear Fullness    HPI Jorge Jefferson is a 72 y.o. male with a medical history as below who presents to the ED today for ear fullness. He reports over the weekend that he noticed fullness in his ear. He denies trauma or barotrauma. He does use q-tips. Has not seen blood on the ends. He put his hearing aids this morning and heard clicking. Reporrts that he went to have his hearing aids placed and was told he had a large cerumen impaction that. No reports ear drainage or bleeding. Denies HA, vertigo, visual changes, tinnitus, sinus pressure/pain, nasal congestion, ST.   HPI  Past Medical History:  Diagnosis Date  . Hearing loss   . Hypertension   . Renal disorder     There are no active problems to display for this patient.   History reviewed. No pertinent surgical history.      Home Medications    Prior to Admission medications   Medication Sig Start Date End Date Taking? Authorizing Provider  amLODipine (NORVASC) 10 MG tablet Take 10 mg by mouth daily. 04/03/15   [provider]  aspirin EC 81 MG tablet Take 81 mg by mouth daily.    [provider]  cilostazol (PLETAL) 100 MG tablet Take 100 mg by mouth daily. 03/02/17   [provider]  cloNIDine (CATAPRES) 0.1 MG tablet  04/15/15   [provider]  doxycycline (VIBRAMYCIN) 100 MG capsule Take 1 capsule (100 mg total) 2 (two) times daily by mouth. One po bid x 7 days 09/04/17   Jacqlyn Larsen, PA-C  HYDROcodone-acetaminophen (NORCO/VICODIN) 5-325 MG tablet Take 1 tablet by mouth every 6 (six) hours as needed for severe pain. 05/20/16   Duffy Bruce, MD  Multiple Vitamins-Minerals (CENTRUM ADULTS) TABS Take 1 tablet by mouth daily.    [provider]  neomycin-polymyxin-hydrocortisone (CORTISPORIN) otic  solution 1-2 drops to the toe after soaking twice daily 08/09/14   Hyatt, Max T, DPM  simvastatin (ZOCOR) 10 MG tablet Take 1 tablet (10 mg total) by mouth at bedtime. 10/28/17   Elam Dutch, MD    Family History History reviewed. No pertinent family history.  Social History Social History   Tobacco Use  . Smoking status: Current Every Day Smoker    Years: 30.00    Types: Cigarettes  . Smokeless tobacco: Never Used  . Tobacco comment: Up to 1 pk  daily  Substance Use Topics  . Alcohol use: Yes    Comment: socially   . Drug use: No     Allergies   Patient has no known allergies.   Review of Systems Review of Systems  Constitutional: Negative for fever.  HENT: Negative for congestion, drooling, ear discharge, ear pain, facial swelling, nosebleeds, postnasal drip, sinus pressure, sinus pain, sneezing, sore throat, tinnitus, trouble swallowing and voice change. Hearing loss: chronic.        Ear fullness  Eyes: Negative for photophobia, pain, discharge, redness, itching and visual disturbance.  Musculoskeletal: Negative for neck pain and neck stiffness.  Skin: Negative for rash and wound.  Neurological: Negative for dizziness and headaches.     Physical Exam Updated Vital Signs BP (!) 107/91 (BP Location: Right Arm)   Pulse 82   Temp 98.2 F (36.8 C) (Oral)   Resp  18   Ht 6' 9.5" (2.07 m)   Wt 112.9 kg   SpO2 98%   BMI 26.36 kg/m   Physical Exam  Constitutional: He appears well-developed and well-nourished.  HENT:  Head: Normocephalic and atraumatic.  Right Ear: External ear normal. No mastoid tenderness.  Left Ear: Hearing, tympanic membrane, external ear and ear canal normal. No drainage, swelling or tenderness. No foreign bodies. No mastoid tenderness. Tympanic membrane is not injected, not scarred, not perforated, not erythematous, not retracted and not bulging.  No middle ear effusion. No hemotympanum. No decreased hearing is noted.  Nose: Nose normal.  No mucosal edema or rhinorrhea. Right sinus exhibits no maxillary sinus tenderness and no frontal sinus tenderness. Left sinus exhibits no maxillary sinus tenderness and no frontal sinus tenderness.  Cerumen impaction of right ear canal. Unable to visualize TM. Patent TM tube of left TM. Normal canal. No mastoid erythema or edema. The patient has normal phonation and is in control of secretions. No stridor.  Midline uvula without edema. Soft palate rises symmetrically.  No tonsillar erythema or exudates. No PTA. Tongue protrusion is normal. No trismus. No creptius on neck palpation. Edentulous (dentures).  No gingival erythema or fluctuance noted. Mucus membranes moist.   Eyes: Conjunctivae are normal. Right eye exhibits no discharge. Left eye exhibits no discharge. No scleral icterus.  Neck:  No nuchal rigidity or meningismus  Pulmonary/Chest: Effort normal. No respiratory distress.  Lymphadenopathy:    He has no cervical adenopathy.  Neurological: He is alert.  Speech clear. Follows commands. No facial droop. PERRLA. EOM grossly intact. CN III-XII grossly intact. Grossly moves all extremities 4 without ataxia. Able and appropriate strength for age to upper and lower extremities bilaterally.   Skin: Skin is warm and dry. Capillary refill takes less than 2 seconds. No pallor.  No vesicular like rash in the V1 nerve distrubution.   Psychiatric: He has a normal mood and affect.  Nursing note and vitals reviewed.    ED Treatments / Results  Labs (all labs ordered are listed, but only abnormal results are displayed) Labs Reviewed - No data to display  EKG None  Radiology No results found.  Procedures .Ear Cerumen Removal Date/Time: 08/08/2018 5:11 PM Performed by: Jillyn Ledger, PA-C Authorized by: Jillyn Ledger, PA-C   Consent:    Consent obtained:  Verbal   Consent given by:  Patient   Risks discussed:  Bleeding, dizziness, incomplete removal, infection, pain and TM  perforation   Alternatives discussed:  No treatment Procedure details:    Location:  R ear   Procedure type: irrigation   Post-procedure details:    Inspection:  TM intact   Hearing quality:  Improved   Patient tolerance of procedure:  Tolerated well, no immediate complications   (including critical care time)  Medications Ordered in ED Medications  hydrogen peroxide 3 % external solution (has no administration in time range)  ciprofloxacin-dexamethasone (CIPRODEX) 0.3-0.1 % OTIC (EAR) suspension 2 drop (has no administration in time range)     Initial Impression / Assessment and Plan / ED Course  I have reviewed the triage vital signs and the nursing notes.  Pertinent labs & imaging results that were available during my care of the patient were reviewed by me and considered in my medical decision making (see chart for details).     72 y.o. male with cerumen impaction that was relieved int he department. No signs of TM rupture. Mild irritation of ear canal. Will give  ciprodex drops in the department. PRN referal to ENT given. Discussed ear care. Patient is not to stick anything smaller then his elbow in his ear (q-tips, etc). Exam unconcerning for infectious etiology. No vertigo or neurologic symptoms. I advised the patient to follow-up with pcp this week. Specific return precautions discussed. Time was given for all questions to be answered. The patient verbalized understanding and agreement with plan. The patient appears safe for discharge home.  Final Clinical Impressions(s) / ED Diagnoses   Final diagnoses:  Impacted cerumen of right ear    ED Discharge Orders    None       Lorelle Gibbs 08/08/18 1713    Mesner, Corene Cornea, MD 08/08/18 [phone]

## 2018-08-08 NOTE — ED Triage Notes (Signed)
Pt reports having right ear fullness and cannot hear on that side.  States he went to the hearing aid place and they told him it looked like snot on his eardrum.

## 2018-08-08 NOTE — Discharge Instructions (Signed)
Please read and follow all provided instructions.  You were seen here today for cerumen impaction.   Home Instructions: Please do not insert anything into your ear that is smaller than your elbow.  This includes QTips, keys, hair pins, etc.    Please use ciprodex drops in the right ear (2 drops every 12 hours) for the next 7 days.   If your ears produce extra wax, you can help reduce the build up by:  1) Allow soapy water to drip down into your ear canals when you wash your hair (it can dissolve the wax the same way that dishwashing liquid dissolves grease), and/or  2) Mix Hydrogen Peroxide half-and-half with water (DO NOT USE HYDROGEN PEROXIDE WITHOUT DILUTING IT WITH WATER as it can burn the skin in your ear); pour a little of this mixture into each ear canal while in the shower. Do this 2-3 times each week.  If your ears are itchy, place several drops of Sweet Oil into each canal to soothe the itching.  If it's not effective, place a small amount of hydrocortisone ointment on the tip of your pinky finger and rub it gently in the ear canal.  The heat of your body will melt the ointment, allowing it to spread in your ear canal and reduce the itching.  Follow-up instructions: Please follow-up with your primary care provider for further evaluation of symptoms and treatment.   Additional Information:  Your vital signs today were: BP (!) 107/91 (BP Location: Right Arm)    Pulse 82    Temp 98.2 F (36.8 C) (Oral)    Resp 18    Ht 6' 9.5" (2.07 m)    Wt 112.9 kg    SpO2 98%    BMI 26.36 kg/m  If your blood pressure (BP) was elevated above 135/85 this visit, please have this repeated by your doctor within one month. ---------------

## 2018-08-08 NOTE — ED Notes (Signed)
Significant cerumen impaction to right ear.

## 2018-08-08 NOTE — ED Notes (Signed)
Successful cerumen removal.

## 2018-09-07 ENCOUNTER — Ambulatory Visit (INDEPENDENT_AMBULATORY_CARE_PROVIDER_SITE_OTHER): Payer: Medicare Other | Admitting: Podiatry

## 2018-09-07 ENCOUNTER — Encounter: Payer: Self-pay | Admitting: Podiatry

## 2018-09-07 DIAGNOSIS — M79676 Pain in unspecified toe(s): Secondary | ICD-10-CM | POA: Diagnosis not present

## 2018-09-07 DIAGNOSIS — B351 Tinea unguium: Secondary | ICD-10-CM | POA: Diagnosis not present

## 2018-09-07 DIAGNOSIS — D689 Coagulation defect, unspecified: Secondary | ICD-10-CM

## 2018-09-07 NOTE — Progress Notes (Signed)
Patient ID: Jorge Jefferson, male   DOB: Jun 01, 1946, 72 y.o.   MRN: [id number] Complaint:  Visit Type: Patient returns to my office for continued preventative foot care services. Complaint: Patient states" my nails have grown long and thick and become painful to walk and wear shoes" . He presents for preventative foot care services. No changes to ROS. Patient is taking pletal.  Podiatric Exam: Vascular: dorsalis pedis and posterior tibial pulses are palpable bilateral. Capillary return is immediate. Temperature gradient is WNL. Skin turgor WNL  Sensorium: Normal Semmes Weinstein monofilament test. Normal tactile sensation bilaterally. Tingling from sural nerve left foot. Nail Exam: Pt has thick disfigured discolored nails with subungual debris noted bilateral entire nail hallux through fifth toenails Ulcer Exam: There is no evidence of ulcer or pre-ulcerative changes or infection. Orthopedic Exam: Muscle tone and strength are WNL. No limitations in general ROM. 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 Skin: No Porokeratosis. No infection or ulcers.    Diagnosis:  Onychomycosis  Pain in right toe, pain in left toes  Treatment & Plan Procedures and Treatment: Consent by patient was obtained for treatment procedures. The patient understood the discussion of treatment and procedures well. All questions were answered thoroughly reviewed. Debridement of mycotic and hypertrophic toenails, 1 through 5 bilateral and clearing of subungual debris. No ulceration, no infection noted.  ABN signed for 2019. Return Visit-Office Procedure: Patient instructed to return to the office for a follow up visit 3 months for continued evaluation and treatment.  Gardiner Barefoot DPM

## 2018-09-12 ENCOUNTER — Ambulatory Visit (INDEPENDENT_AMBULATORY_CARE_PROVIDER_SITE_OTHER): Payer: Medicare Other | Admitting: Otolaryngology

## 2018-09-12 DIAGNOSIS — H9071 Mixed conductive and sensorineural hearing loss, unilateral, right ear, with unrestricted hearing on the contralateral side: Secondary | ICD-10-CM

## 2018-09-12 DIAGNOSIS — H66011 Acute suppurative otitis media with spontaneous rupture of ear drum, right ear: Secondary | ICD-10-CM

## 2018-10-06 ENCOUNTER — Other Ambulatory Visit: Payer: Self-pay | Admitting: Vascular Surgery

## 2018-10-31 DIAGNOSIS — E785 Hyperlipidemia, unspecified: Secondary | ICD-10-CM | POA: Diagnosis not present

## 2018-10-31 DIAGNOSIS — I739 Peripheral vascular disease, unspecified: Secondary | ICD-10-CM | POA: Diagnosis not present

## 2018-10-31 DIAGNOSIS — I10 Essential (primary) hypertension: Secondary | ICD-10-CM | POA: Diagnosis not present

## 2018-10-31 DIAGNOSIS — Z6831 Body mass index (BMI) 31.0-31.9, adult: Secondary | ICD-10-CM | POA: Diagnosis not present

## 2018-11-07 ENCOUNTER — Other Ambulatory Visit: Payer: Self-pay | Admitting: Vascular Surgery

## 2018-11-13 IMAGING — DX DG FOOT COMPLETE 3+V*L*
3 series · 3 of 3 positions shown · non-contrast
Comparison: 03/19/2017

CLINICAL DATA: Left foot pain, swelling.

EXAM:
LEFT FOOT - COMPLETE 3+ VIEW

[foot ap]
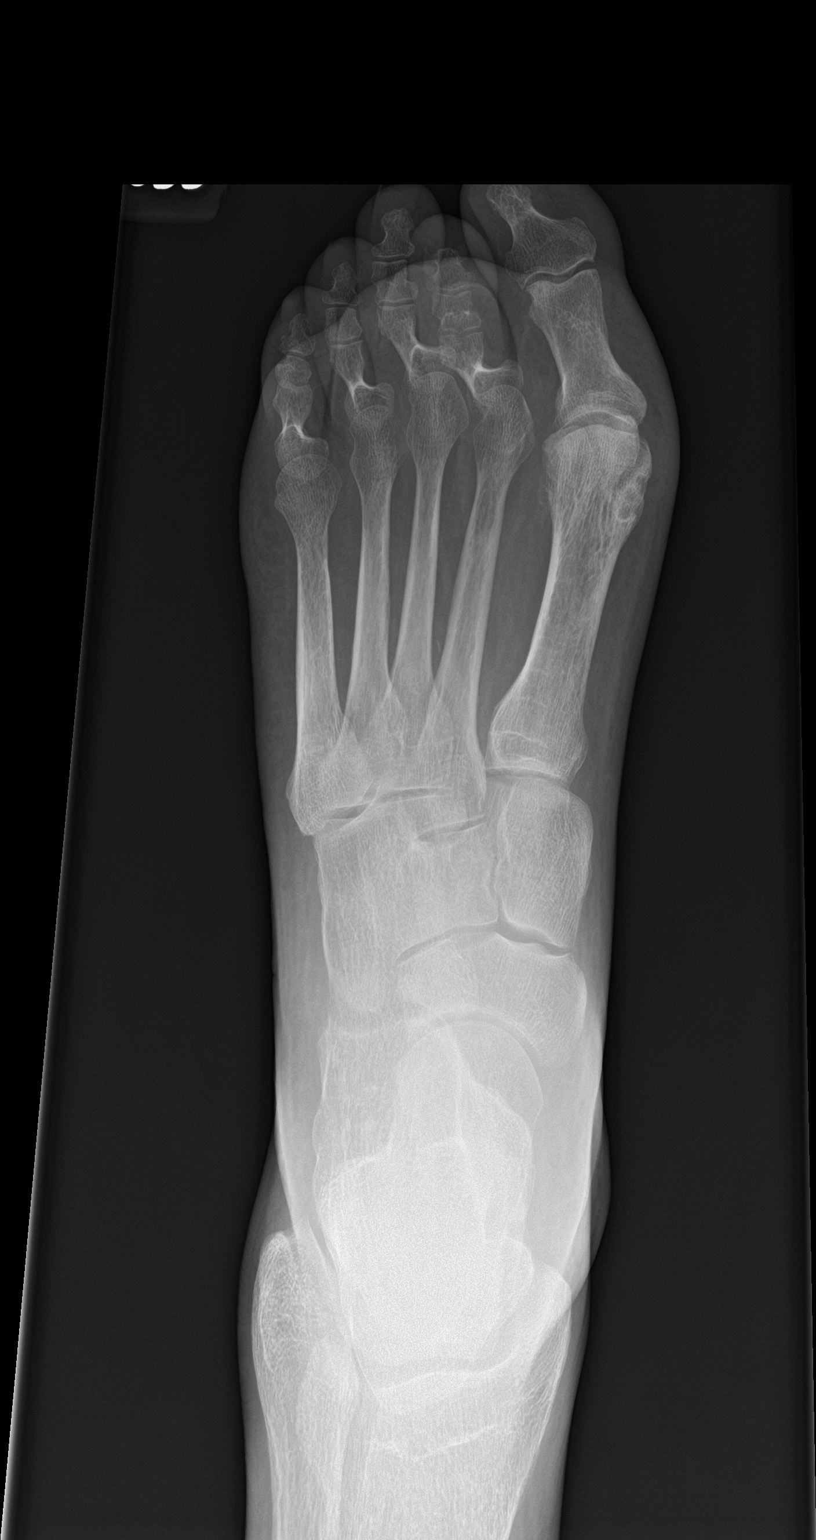

[foot obl]
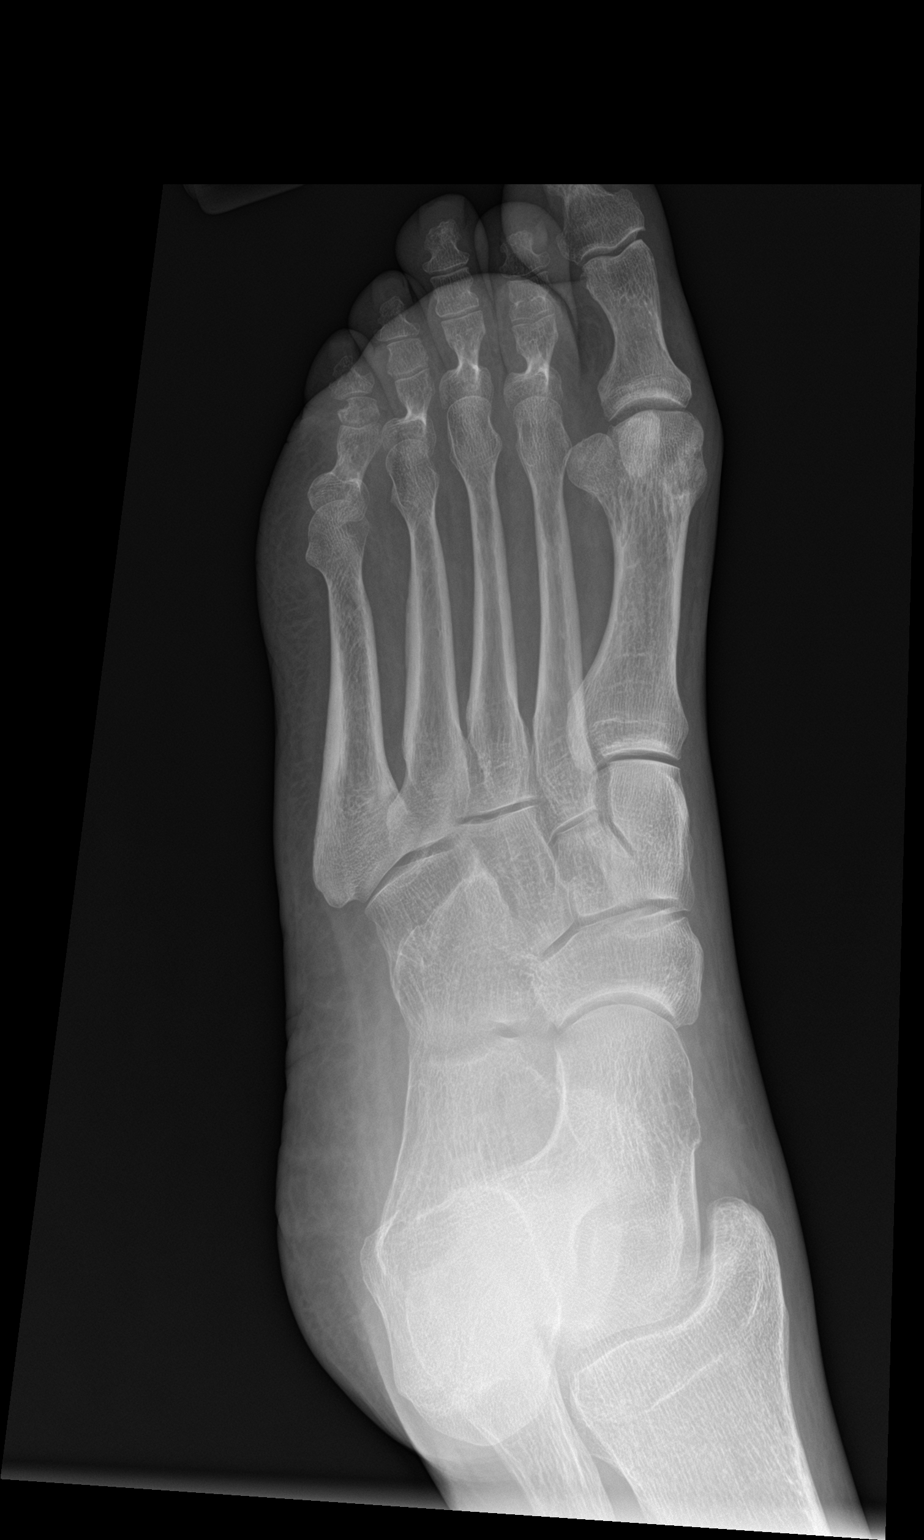

[foot lat]
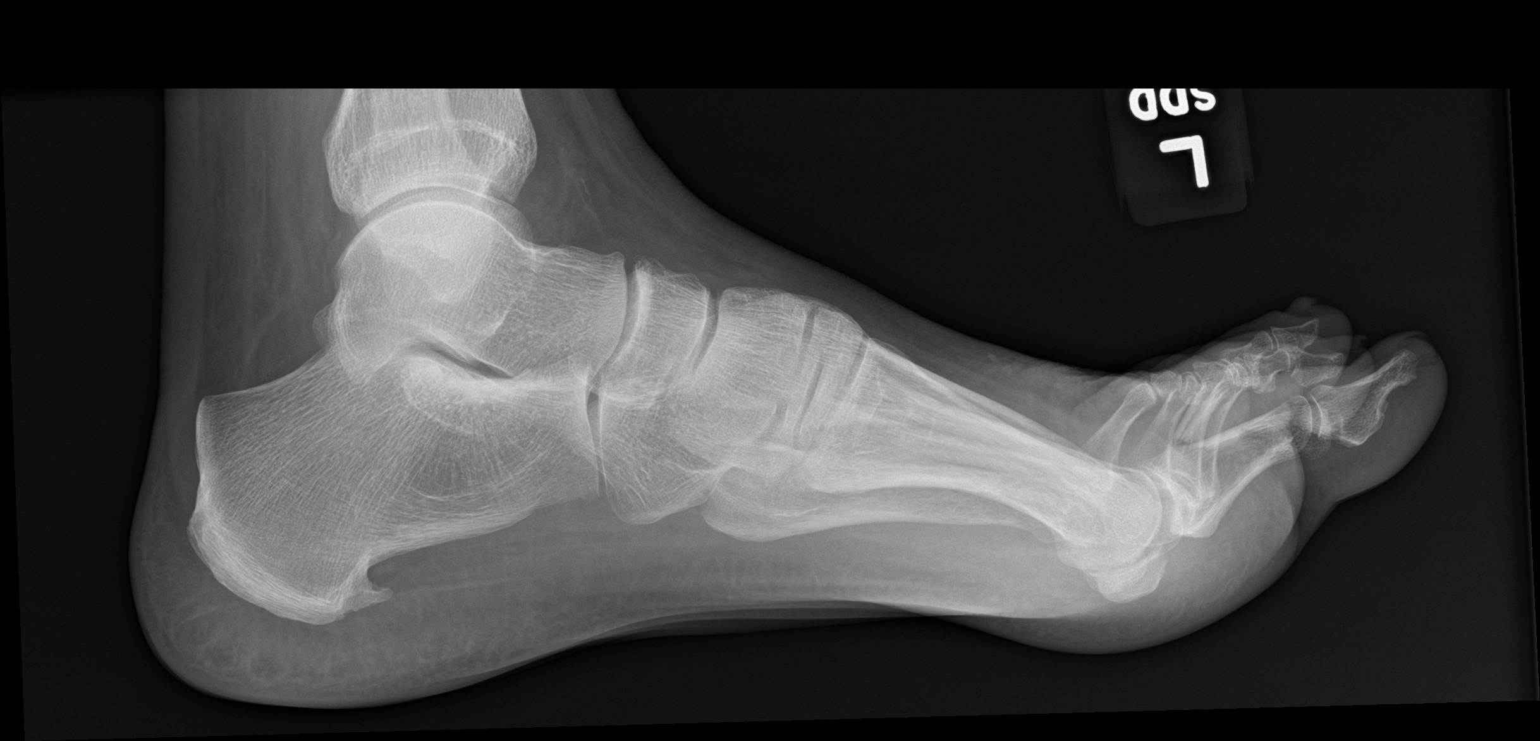

[3 of 3 positions shown; findings below may reference images not displayed]

FINDINGS: Plantar calcaneal spur. No acute bony abnormality. Specifically, no
fracture, subluxation, or dislocation. Soft tissues are intact.
IMPRESSION: No acute bony abnormality.

## 2018-11-22 ENCOUNTER — Other Ambulatory Visit: Payer: Self-pay | Admitting: Family Medicine

## 2018-11-22 DIAGNOSIS — R945 Abnormal results of liver function studies: Principal | ICD-10-CM

## 2018-11-22 DIAGNOSIS — R7989 Other specified abnormal findings of blood chemistry: Secondary | ICD-10-CM

## 2018-11-24 ENCOUNTER — Ambulatory Visit
Admission: RE | Admit: 2018-11-24 | Discharge: 2018-11-24 | Disposition: A | Discharge status: Home / Self Care | Payer: Medicare Other | Source: Ambulatory Visit | Attending: Family Medicine | Admitting: Family Medicine

## 2018-11-24 DIAGNOSIS — K8020 Calculus of gallbladder without cholecystitis without obstruction: Secondary | ICD-10-CM | POA: Diagnosis not present

## 2018-11-24 DIAGNOSIS — K760 Fatty (change of) liver, not elsewhere classified: Secondary | ICD-10-CM | POA: Diagnosis not present

## 2018-11-24 DIAGNOSIS — R7989 Other specified abnormal findings of blood chemistry: Secondary | ICD-10-CM

## 2018-11-24 DIAGNOSIS — R945 Abnormal results of liver function studies: Principal | ICD-10-CM

## 2018-12-05 DIAGNOSIS — K8020 Calculus of gallbladder without cholecystitis without obstruction: Secondary | ICD-10-CM | POA: Diagnosis not present

## 2018-12-05 DIAGNOSIS — B1920 Unspecified viral hepatitis C without hepatic coma: Secondary | ICD-10-CM | POA: Diagnosis not present

## 2018-12-07 ENCOUNTER — Ambulatory Visit (INDEPENDENT_AMBULATORY_CARE_PROVIDER_SITE_OTHER): Payer: Medicare Other | Admitting: Podiatry

## 2018-12-07 ENCOUNTER — Encounter: Payer: Self-pay | Admitting: Podiatry

## 2018-12-07 DIAGNOSIS — M79676 Pain in unspecified toe(s): Secondary | ICD-10-CM

## 2018-12-07 DIAGNOSIS — B351 Tinea unguium: Secondary | ICD-10-CM | POA: Diagnosis not present

## 2018-12-07 NOTE — Progress Notes (Signed)
Patient ID: Jorge Jefferson, male   DOB: [birth year], 73 y.o.   MRN: [id number] Complaint:  Visit Type: Patient returns to my office for continued preventative foot care services. Complaint: Patient states" my nails have grown long and thick and become painful to walk and wear shoes" . He presents for preventative foot care services. No changes to ROS. Patient is taking pletal.  Podiatric Exam: Vascular: dorsalis pedis and posterior tibial pulses are palpable bilateral. Capillary return is immediate. Temperature gradient is WNL. Skin turgor WNL  Sensorium: Normal Semmes Weinstein monofilament test. Normal tactile sensation bilaterally. Tingling from sural nerve left foot. Nail Exam: Pt has thick disfigured discolored nails with subungual debris noted bilateral entire nail hallux through fifth toenails Ulcer Exam: There is no evidence of ulcer or pre-ulcerative changes or infection. Orthopedic Exam: Muscle tone and strength are WNL. No limitations in general ROM. 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 Skin: No Porokeratosis. No infection or ulcers.    Diagnosis:  Onychomycosis  Pain in right toe, pain in left toes  Treatment & Plan Procedures and Treatment: Consent by patient was obtained for treatment procedures. The patient understood the discussion of treatment and procedures well. All questions were answered thoroughly reviewed. Debridement of mycotic and hypertrophic toenails, 1 through 5 bilateral and clearing of subungual debris. No ulceration, no infection noted.   Return Visit-Office Procedure: Patient instructed to return to the office for a follow up visit 3 months for continued evaluation and treatment.  Gardiner Barefoot DPM

## 2018-12-19 DIAGNOSIS — Z6831 Body mass index (BMI) 31.0-31.9, adult: Secondary | ICD-10-CM | POA: Diagnosis not present

## 2018-12-19 DIAGNOSIS — Z125 Encounter for screening for malignant neoplasm of prostate: Secondary | ICD-10-CM | POA: Diagnosis not present

## 2019-02-15 ENCOUNTER — Ambulatory Visit: Payer: Medicare Other | Admitting: Podiatry

## 2019-02-21 ENCOUNTER — Ambulatory Visit: Payer: Medicare Other | Admitting: Podiatry

## 2019-02-24 ENCOUNTER — Ambulatory Visit (INDEPENDENT_AMBULATORY_CARE_PROVIDER_SITE_OTHER): Payer: Medicare Other | Admitting: Podiatry

## 2019-02-24 ENCOUNTER — Encounter: Payer: Self-pay | Admitting: Podiatry

## 2019-02-24 ENCOUNTER — Other Ambulatory Visit: Payer: Self-pay

## 2019-02-24 VITALS — Temp 97.5°F

## 2019-02-24 DIAGNOSIS — M79676 Pain in unspecified toe(s): Secondary | ICD-10-CM

## 2019-02-24 DIAGNOSIS — B351 Tinea unguium: Secondary | ICD-10-CM

## 2019-02-24 DIAGNOSIS — D689 Coagulation defect, unspecified: Secondary | ICD-10-CM

## 2019-02-24 NOTE — Progress Notes (Signed)
Patient ID: Jorge Jefferson, male   DOB: 1946-04-29, 74 y.o.   MRN: [id number] Complaint:  Visit Type: Patient returns to my office for continued preventative foot care services. Complaint: Patient states" my nails have grown long and thick and become painful to walk and wear shoes" . He presents for preventative foot care services. No changes to ROS. Patient is taking pletal.  Podiatric Exam: Vascular: dorsalis pedis and posterior tibial pulses are palpable bilateral. Capillary return is immediate. Temperature gradient is WNL. Skin turgor WNL  Sensorium: Normal Semmes Weinstein monofilament test. Normal tactile sensation bilaterally. Tingling from sural nerve left foot. Nail Exam: Pt has thick disfigured discolored nails with subungual debris noted bilateral entire nail hallux through fifth toenails Ulcer Exam: There is no evidence of ulcer or pre-ulcerative changes or infection. Orthopedic Exam: Muscle tone and strength are WNL. No limitations in general ROM. 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 Skin: No Porokeratosis. No infection or ulcers.    Diagnosis:  Onychomycosis  Pain in right toe, pain in left toes  Treatment & Plan Procedures and Treatment: Consent by patient was obtained for treatment procedures. The patient understood the discussion of treatment and procedures well. All questions were answered thoroughly reviewed. Debridement of mycotic and hypertrophic toenails, 1 through 5 bilateral and clearing of subungual debris. No ulceration, no infection noted.   Return Visit-Office Procedure: Patient instructed to return to the office for a follow up visit 10 weeks  for continued evaluation and treatment.  Gardiner Barefoot DPM

## 2019-03-21 DIAGNOSIS — I10 Essential (primary) hypertension: Secondary | ICD-10-CM | POA: Diagnosis not present

## 2019-03-21 DIAGNOSIS — J309 Allergic rhinitis, unspecified: Secondary | ICD-10-CM | POA: Diagnosis not present

## 2019-03-21 DIAGNOSIS — B1920 Unspecified viral hepatitis C without hepatic coma: Secondary | ICD-10-CM | POA: Diagnosis not present

## 2019-03-21 DIAGNOSIS — E785 Hyperlipidemia, unspecified: Secondary | ICD-10-CM | POA: Diagnosis not present

## 2019-05-10 ENCOUNTER — Encounter: Payer: Self-pay | Admitting: Podiatry

## 2019-05-10 ENCOUNTER — Ambulatory Visit (INDEPENDENT_AMBULATORY_CARE_PROVIDER_SITE_OTHER): Payer: Medicare Other | Admitting: Podiatry

## 2019-05-10 ENCOUNTER — Other Ambulatory Visit: Payer: Self-pay

## 2019-05-10 VITALS — Temp 97.8°F

## 2019-05-10 DIAGNOSIS — B351 Tinea unguium: Secondary | ICD-10-CM

## 2019-05-10 DIAGNOSIS — M79676 Pain in unspecified toe(s): Secondary | ICD-10-CM | POA: Diagnosis not present

## 2019-05-10 DIAGNOSIS — D689 Coagulation defect, unspecified: Secondary | ICD-10-CM

## 2019-05-10 NOTE — Progress Notes (Signed)
Patient ID: Jorge Jefferson, male   DOB: 13-Feb-1946, 73 y.o.   MRN: [id number] Complaint:  Visit Type: Patient returns to my office for continued preventative foot care services. Complaint: Patient states" my nails have grown long and thick and become painful to walk and wear shoes" . He presents for preventative foot care services. No changes to ROS. Patient is taking pletal.  Podiatric Exam: Vascular: dorsalis pedis and posterior tibial pulses are palpable bilateral. Capillary return is immediate. Temperature gradient is WNL. Skin turgor WNL  Sensorium: Normal Semmes Weinstein monofilament test. Normal tactile sensation bilaterally. Tingling from sural nerve left foot. Nail Exam: Pt has thick disfigured discolored nails with subungual debris noted bilateral entire nail hallux through fifth toenails Ulcer Exam: There is no evidence of ulcer or pre-ulcerative changes or infection. Orthopedic Exam: Muscle tone and strength are WNL. No limitations in general ROM. 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 Skin: No Porokeratosis. No infection or ulcers.    Diagnosis:  Onychomycosis  Pain in right toe, pain in left toes  Treatment & Plan Procedures and Treatment: Consent by patient was obtained for treatment procedures. The patient understood the discussion of treatment and procedures well. All questions were answered thoroughly reviewed. Debridement of mycotic and hypertrophic toenails, 1 through 5 bilateral and clearing of subungual debris. No ulceration, no infection noted.   Return Visit-Office Procedure: Patient instructed to return to the office for a follow up visit 10 weeks  for continued evaluation and treatment.  Gardiner Barefoot DPM

## 2019-06-12 ENCOUNTER — Emergency Department (HOSPITAL_COMMUNITY)
Admission: EM | Admit: 2019-06-12 | Discharge: 2019-06-12 | Disposition: A | Discharge status: Home / Self Care | Payer: Medicare Other | Attending: Emergency Medicine | Admitting: Emergency Medicine

## 2019-06-12 ENCOUNTER — Other Ambulatory Visit: Payer: Self-pay

## 2019-06-12 ENCOUNTER — Encounter (HOSPITAL_COMMUNITY): Payer: Self-pay | Admitting: Emergency Medicine

## 2019-06-12 DIAGNOSIS — I10 Essential (primary) hypertension: Secondary | ICD-10-CM | POA: Diagnosis not present

## 2019-06-12 DIAGNOSIS — F17210 Nicotine dependence, cigarettes, uncomplicated: Secondary | ICD-10-CM | POA: Insufficient documentation

## 2019-06-12 DIAGNOSIS — Z79899 Other long term (current) drug therapy: Secondary | ICD-10-CM | POA: Insufficient documentation

## 2019-06-12 DIAGNOSIS — H9222 Otorrhagia, left ear: Secondary | ICD-10-CM

## 2019-06-12 MED ORDER — CIPRODEX 0.3-0.1 % OT SUSP: 4.0000 [drp] | Freq: Two times a day (BID) | OTIC | 0 refills | Status: AC

## 2019-06-12 NOTE — Discharge Instructions (Addendum)
If your ear begins to bleed again, you can place 4 drops of Ciprodex in your left ear twice daily.  Please follow-up with the ear, nose, throat doctor for further evaluation and treatment and for removal of your tube.  Please return to the emergency department if you develop any new or worsening symptoms.

## 2019-06-12 NOTE — ED Provider Notes (Signed)
[facility] EMERGENCY DEPARTMENT Provider Note   CSN: [id number] Arrival date & time: 06/12/19  1718     History   Chief Complaint Chief Complaint  Patient presents with  . Ear Drainage    HPI Jorge Jefferson is a 73 y.o. male with history of hearing loss with hearing aid, HTN who presents with reports spontaneous bleeding from L ear that occurred earlier today. He denies any pain. He denies putting anything in his ear prior to the bleeding. He does use Q-tips when he showers. 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 He denies any fever.      HPI  Past Medical History:  Diagnosis Date  . Hearing loss   . Hypertension   . Renal disorder     There are no active problems to display for this patient.   History reviewed. No pertinent surgical history.      Home Medications    Prior to Admission medications   Medication Sig Start Date End Date Taking? Authorizing Provider  amLODipine (NORVASC) 10 MG tablet Take 10 mg by mouth daily. 04/03/15   [provider]  ciprofloxacin-dexamethasone (CIPRODEX) OTIC suspension Place 4 drops into the left ear 2 (two) times daily for 5 days. 06/12/19 06/17/19  Frederica Kuster, PA-C  cloNIDine (CATAPRES) 0.1 MG tablet TAKE 1 TABLET BY MOUTH EVERY MORNING AND AT BEDTIME 02/04/19   [provider]  Multiple Vitamins-Minerals (CENTRUM ADULTS) TABS Take 1 tablet by mouth daily.    [provider]  simvastatin (ZOCOR) 10 MG tablet TAKE 1 TABLET BY MOUTH EVERYDAY AT BEDTIME 11/08/18   Waynetta Sandy, MD    Family History History reviewed. No pertinent family history.  Social History Social History   Tobacco Use  . Smoking status: Current Every Day Smoker    Years: 30.00    Types: Cigarettes  . Smokeless tobacco: Never Used  . Tobacco comment: Up to 1 pk  daily  Substance Use Topics  . Alcohol use: Yes    Comment: socially   . Drug use: No     Allergies   Patient has no known  allergies.   Review of Systems Review of Systems  Constitutional: Negative for fever.  HENT: Positive for ear discharge. Negative for ear pain.      Physical Exam Updated Vital Signs BP (!) 144/84 (BP Location: Right Arm)   Pulse 80   Temp 97.7 F (36.5 C)   Resp 18   Ht 6\' 2"  (1.88 m)   Wt 122 kg   SpO2 99%   BMI 34.54 kg/m   Physical Exam Vitals signs and nursing note reviewed.  Constitutional:      General: He is not in acute distress.    Appearance: He is well-developed. He is not diaphoretic.  HENT:     Head: Normocephalic and atraumatic.     Right Ear: Tympanic membrane normal.     Ears:     Comments: Tympanostomy tube in place in the L TM; no blood noted in the canal or behind the TM    Mouth/Throat:     Pharynx: No oropharyngeal exudate.  Eyes:     General: No scleral icterus.       Right eye: No discharge.        Left eye: No discharge.     Conjunctiva/sclera: Conjunctivae normal.     Pupils: Pupils are equal, round, and reactive to light.  Neck:  Musculoskeletal: Normal range of motion and neck supple.     Thyroid: No thyromegaly.  Cardiovascular:     Rate and Rhythm: Normal rate and regular rhythm.     Heart sounds: Normal heart sounds. No murmur. No friction rub. No gallop.   Pulmonary:     Effort: Pulmonary effort is normal. No respiratory distress.     Breath sounds: Normal breath sounds. No stridor. No wheezing or rales.  Abdominal:     General: Bowel sounds are normal. There is no distension.     Palpations: Abdomen is soft.     Tenderness: There is no abdominal tenderness. There is no guarding or rebound.  Lymphadenopathy:     Cervical: No cervical adenopathy.  Skin:    General: Skin is warm and dry.     Coloration: Skin is not pale.     Findings: No rash.  Neurological:     Mental Status: He is alert.     Coordination: Coordination normal.      ED Treatments / Results  Labs (all labs ordered are listed, but only abnormal  results are displayed) Labs Reviewed - No data to display  EKG None  Radiology No results found.  Procedures Procedures (including critical care time)  Medications Ordered in ED Medications - No data to display   Initial Impression / Assessment and Plan / ED Course  I have reviewed the triage vital signs and the nursing notes.  Pertinent labs & imaging results that were available during my care of the patient were reviewed by me and considered in my medical decision making (see chart for details).        Patient reports L ear bleeding, but no blood noted on exam. Very minimal specks of dried blood in the auricle. Tympanostomy in place for 9 years. Will refer to ENT for further evaluation and treatment of the tube, possible removal. Will discharge home with Ciprodex in case bleeding recurs. Return precautions discussed. Patient understands and agrees with plan. Patient vitals stable throughout ED course and discharged in satisfactory condition. Patient also evaluated by my attending, Dr. Lacinda Axon, who guided the patient's management and agrees with plan.  Final Clinical Impressions(s) / ED Diagnoses   Final diagnoses:  Otorrhagia of left ear    ED Discharge Orders         Ordered    ciprofloxacin-dexamethasone (CIPRODEX) OTIC suspension  2 times daily     06/12/19 [address], [place]hal Coria 06/12/19 Cristine Polio, MD 06/13/19 [phone]

## 2019-06-12 NOTE — ED Triage Notes (Signed)
Pt reports sudden onset of left ear bleeding. No pain, no loss of hearing.

## 2019-06-20 DIAGNOSIS — I739 Peripheral vascular disease, unspecified: Secondary | ICD-10-CM | POA: Diagnosis not present

## 2019-06-20 DIAGNOSIS — Z7189 Other specified counseling: Secondary | ICD-10-CM | POA: Diagnosis not present

## 2019-06-20 DIAGNOSIS — I10 Essential (primary) hypertension: Secondary | ICD-10-CM | POA: Diagnosis not present

## 2019-06-20 DIAGNOSIS — E785 Hyperlipidemia, unspecified: Secondary | ICD-10-CM | POA: Diagnosis not present

## 2019-07-04 DIAGNOSIS — Z0000 Encounter for general adult medical examination without abnormal findings: Secondary | ICD-10-CM | POA: Diagnosis not present

## 2019-07-20 ENCOUNTER — Ambulatory Visit (INDEPENDENT_AMBULATORY_CARE_PROVIDER_SITE_OTHER): Payer: Medicare Other | Admitting: Otolaryngology

## 2019-07-20 ENCOUNTER — Other Ambulatory Visit: Payer: Self-pay

## 2019-07-20 DIAGNOSIS — H6983 Other specified disorders of Eustachian tube, bilateral: Secondary | ICD-10-CM

## 2019-07-20 DIAGNOSIS — H7203 Central perforation of tympanic membrane, bilateral: Secondary | ICD-10-CM | POA: Diagnosis not present

## 2019-07-20 DIAGNOSIS — I739 Peripheral vascular disease, unspecified: Secondary | ICD-10-CM

## 2019-07-25 ENCOUNTER — Telehealth (HOSPITAL_COMMUNITY): Payer: Self-pay

## 2019-07-25 NOTE — Telephone Encounter (Signed)

## 2019-07-26 ENCOUNTER — Encounter: Payer: Self-pay | Admitting: Family

## 2019-07-26 ENCOUNTER — Other Ambulatory Visit: Payer: Self-pay

## 2019-07-26 ENCOUNTER — Ambulatory Visit (HOSPITAL_COMMUNITY)
Admission: RE | Admit: 2019-07-26 | Discharge: 2019-07-26 | Disposition: A | Discharge status: Home / Self Care | Payer: Medicare Other | Source: Ambulatory Visit | Attending: Family | Admitting: Family

## 2019-07-26 ENCOUNTER — Ambulatory Visit (INDEPENDENT_AMBULATORY_CARE_PROVIDER_SITE_OTHER): Payer: Medicare Other | Admitting: Family

## 2019-07-26 VITALS — BP 136/87 | HR 81 | Temp 97.7°F | Resp 20 | Ht >= 80 in | Wt 280.6 lb

## 2019-07-26 DIAGNOSIS — I739 Peripheral vascular disease, unspecified: Secondary | ICD-10-CM | POA: Diagnosis not present

## 2019-07-26 DIAGNOSIS — I70213 Atherosclerosis of native arteries of extremities with intermittent claudication, bilateral legs: Secondary | ICD-10-CM | POA: Diagnosis not present

## 2019-07-26 DIAGNOSIS — F17200 Nicotine dependence, unspecified, uncomplicated: Secondary | ICD-10-CM | POA: Diagnosis not present

## 2019-07-26 NOTE — Patient Instructions (Signed)
Steps to Quit Smoking Smoking tobacco is the leading cause of preventable death. It can affect almost every organ in the body. Smoking puts you and people around you at risk for many serious, long-lasting (chronic) diseases. Quitting smoking can be hard, but it is one of the best things that you can do for your health. It is never too late to quit. How do I get ready to quit? When you decide to quit smoking, make a plan to help you succeed. Before you quit:  Pick a date to quit. Set a date within the next 2 weeks to give you time to prepare.  Write down the reasons why you are quitting. Keep this list in places where you will see it often.  Tell your family, friends, and co-workers that you are quitting. Their support is important.  Talk with your doctor about the choices that may help you quit.  Find out if your health insurance will pay for these treatments.  Know the people, places, things, and activities that make you want to smoke (triggers). Avoid them. What first steps can I take to quit smoking?  Throw away all cigarettes at home, at work, and in your car.  Throw away the things that you use when you smoke, such as ashtrays and lighters.  Clean your car. Make sure to empty the ashtray.  Clean your home, including curtains and carpets. What can I do to help me quit smoking? Talk with your doctor about taking medicines and seeing a counselor at the same time. You are more likely to succeed when you do both.  If you are pregnant or breastfeeding, talk with your doctor about counseling or other ways to quit smoking. Do not take medicine to help you quit smoking unless your doctor tells you to do so. To quit smoking: Quit right away  Quit smoking totally, instead of slowly cutting back on how much you smoke over a period of time.  Go to counseling. You are more likely to quit if you go to counseling sessions regularly. Take medicine You may take medicines to help you quit. Some  medicines need a prescription, and some you can buy over-the-counter. Some medicines may contain a drug called nicotine to replace the nicotine in cigarettes. Medicines may:  Help you to stop having the desire to smoke (cravings).  Help to stop the problems that come when you stop smoking (withdrawal symptoms). Your doctor may ask you to use:  Nicotine patches, gum, or lozenges.  Nicotine inhalers or sprays.  Non-nicotine medicine that is taken by mouth. Find resources Find resources and other ways to help you quit smoking and remain smoke-free after you quit. These resources are most helpful when you use them often. They include:  Online chats with a counselor.  Phone quitlines.  Printed self-help materials.  Support groups or group counseling.  Text messaging programs.  Mobile phone apps. Use apps on your mobile phone or tablet that can help you stick to your quit plan. There are many free apps for mobile phones and tablets as well as websites. Examples include Quit Guide from the CDC and smokefree.gov  What things can I do to make it easier to quit?   Talk to your family and friends. Ask them to support and encourage you.  Call a phone quitline (1-800-QUIT-NOW), reach out to support groups, or work with a counselor.  Ask people who smoke to not smoke around you.  Avoid places that make you want to smoke,   such as: ? Bars. ? Parties. ? Smoke-break areas at work.  Spend time with people who do not smoke.  Lower the stress in your life. Stress can make you want to smoke. Try these things to help your stress: ? Getting regular exercise. ? Doing deep-breathing exercises. ? Doing yoga. ? Meditating. ? Doing a body scan. To do this, close your eyes, focus on one area of your body at a time from head to toe. Notice which parts of your body are tense. Try to relax the muscles in those areas. How will I feel when I quit smoking? Day 1 to 3 weeks Within the first 24 hours,  you may start to have some problems that come from quitting tobacco. These problems are very bad 2-3 days after you quit, but they do not often last for more than 2-3 weeks. You may get these symptoms:  Mood swings.  Feeling restless, nervous, angry, or annoyed.  Trouble concentrating.  Dizziness.  Strong desire for high-sugar foods and nicotine.  Weight gain.  Trouble pooping (constipation).  Feeling like you may vomit (nausea).  Coughing or a sore throat.  Changes in how the medicines that you take for other issues work in your body.  Depression.  Trouble sleeping (insomnia). Week 3 and afterward After the first 2-3 weeks of quitting, you may start to notice more positive results, such as:  Better sense of smell and taste.  Less coughing and sore throat.  Slower heart rate.  Lower blood pressure.  Clearer skin.  Better breathing.  Fewer sick days. Quitting smoking can be hard. Do not give up if you fail the first time. Some people need to try a few times before they succeed. Do your best to stick to your quit plan, and talk with your doctor if you have any questions or concerns. Summary  Smoking tobacco is the leading cause of preventable death. Quitting smoking can be hard, but it is one of the best things that you can do for your health.  When you decide to quit smoking, make a plan to help you succeed.  Quit smoking right away, not slowly over a period of time.  When you start quitting, seek help from your doctor, family, or friends. This information is not intended to replace advice given to you by your health care provider. Make sure you discuss any questions you have with your health care provider. Document Released: 08/01/2009 Document Revised: 12/23/2018 Document Reviewed: 12/24/2018 Elsevier Patient Education  [employer].     Intermittent Claudication Intermittent claudication is pain in one or both legs that occurs when walking or  exercising and goes away when resting. Intermittent claudication is a symptom of peripheral arterial disease (PAD). This condition is commonly treated with rest, medicine, and healthy lifestyle changes. If medical management does not improve symptoms, surgery can be done to restore blood flow (revascularization) to the affected leg. What are the causes?  This condition is caused by buildup of fatty material (plaque) within the major arteries in the body (atherosclerosis). Plaque makes arteries stiff and narrow, which prevents enough blood from reaching the leg muscles. Pain occurs when you walk or exercise because your muscles need (but cannot get) more blood when you are moving and exercising. What increases the risk? The following factors may make you more likely to develop this condition:  A family history of atherosclerosis.  A personal history of stroke or heart disease.  Older age.  Being inactive (sedentary lifestyle).  Being overweight.  Smoking cigarettes.  Having another health condition such as: ? Diabetes. ? High blood pressure. ? High cholesterol. What are the signs or symptoms? Symptoms of this condition may first develop in the lower leg, and then they may spread to the thigh, hip, buttock, or the back of the lower leg (calf) over time. Symptoms may include:  Aches or pains.  Cramps.  A feeling of tightness, weakness, or heaviness.  A wound on the lower leg or foot that heals poorly or does not heal. How is this diagnosed? This condition may be diagnosed based on:  Your symptoms.  Your medical history.  Tests, such as: ? Blood tests. ? Arterial duplex ultrasound. This test uses images of blood vessels and surrounding organs to evaluate blood flow within arteries. ? Angiogram. In this procedure, dye is injected into arteries and then X-rays are taken. ? Magnetic resonance angiogram (MRA). In this procedure, strong magnets and radio waves are used instead of  X-rays to create images of blood vessels and blood flow. ? CT angiogram (CTA). In this procedure, a large X-ray machine called a CT scanner takes detailed pictures of blood vessels that have been injected with dye. ? Ankle-brachial index (ABI) test. This procedure measures blood pressure in the leg during exercise and at rest. ? Exercise test. For this test, you will walk on a treadmill while tests are done (such as the ABI test) to evaluate how this condition affects your ability to walk or exercise. How is this treated? Treatment for this condition may involve treatment for the underlying cause, such as treatment for high blood pressure, high cholesterol, or diabetes. Treatment may include:  Lifestyle changes such as: ? Starting a supervised or home-based exercise program. ? Losing weight. ? Quitting smoking.  Medicines to help restore blood flow through your legs.  Blood vessel surgery (angioplasty) to restore blood flow around the blocked vessel. This is also known as endovascular therapy (EVT). This is only done if your intermittent claudication is caused by severe peripheral artery disease, a condition in which blood flow is severely or totally restricted by the narrowing of the arteries. Follow these instructions at home: Lifestyle   Maintain a healthy weight.  Eat a diet that is low in saturated fats and calories. Consider working with a diet and nutrition specialist (dietitian) to help you make healthy food choices.  Do not use any products that contain nicotine or tobacco, such as cigarettes and e-cigarettes. If you need help quitting, ask your health care provider.  If your health care provider recommended an exercise program for you, follow it as directed. Your exercise program may involve: ? Walking 3 or more times a week. ? Walking until you have certain symptoms of intermittent claudication. ? Resting until symptoms go away. ? Gradually increasing your walking time to  about 50 minutes a day. General instructions  Work with your health care provider to manage any other health conditions you may have, including diabetes, high blood pressure, or high cholesterol.  Take over-the-counter and prescription medicines only as told by your health care provider.  Keep all follow-up visits as told by your health care provider. This is important. Contact a health care provider if:  Your pain does not go away with rest.  You have sores on your legs that do not heal or have a bad smell or pus coming from them.  Your condition gets worse or does not get better with treatment. Get help right away if:  You have chest pain.  You have difficulty breathing.  You develop arm weakness.  You have trouble speaking.  Your face begins to droop.  Your foot or leg is cold or it changes color.  Your foot or leg becomes numb. These symptoms may represent a serious problem that is an emergency. Do not wait to see if the symptoms will go away. Get medical help right away. Call your local emergency services (911 in the [place] Do not drive yourself to the hospital.  Summary  Intermittent claudication is pain in one or both legs that occurs when walking or exercising and goes away when resting.  This condition is caused by buildup of fatty material (plaque) within the major arteries in the body (atherosclerosis). Plaque makes arteries stiff and narrow, which prevents enough blood from reaching the leg muscles.  Intermittent claudication can be treated with medicine and lifestyle changes. If medical treatment fails, surgery can be done to help return blood flow to the affected area.  Make sure you work with your health care provider to manage any other health conditions you may have, including diabetes, high blood pressure, or high cholesterol. This information is not intended to replace advice given to you by your health care provider. Make sure you discuss any questions you  have with your health care provider. Document Released: 08/07/2004 Document Revised: 09/17/2017 Document Reviewed: 11/05/2016 Elsevier Patient Education  2020 [employer].

## 2019-07-26 NOTE — Progress Notes (Signed)
Virtual Visit via Telephone Note   I connected with Jorge Jefferson on 07/26/2019 using the [email or web address] by telephone and verified that I was speaking with the correct person using two identifiers. Patient was located at his home and accompanied by himself. I am located at the [facility] office[facility]clinic.   The limitations of evaluation and management by telemedicine and the availability of in person appointments have been previously discussed with the patient and are documented in the patients chart. The patient expressed understanding and consented to proceed.  PCP: Iona Beard, MD  Chief Complaint: Follow up intermittent claudication  History of Present Illness: Jorge Jefferson is a 73 y.o. male whom Dr. Oneida Alar has evaluated re problems primarily with his left leg with walking.    Dr. Oneida Alar last evaluated pt on 02-03-18. At that time duplex ultrasound of the lower extremities showed a high-grade right SFA stenosis.  Left SFA was occluded.  ABI on the right was 0.9, left was 0.7. Bilateral lower extremity peripheral arterial disease, left greater than right, mildly symptomatic.   The patient was not interested in intervention at that time and prefered conservative management with medication and walking. The patient was to follow up with our nurse practitioner with repeat ABIs in 1 year.  Pt currently c/o pain in his right hip when walking the length of a football field. He has known lumbar spine issues, L5-S-1 from a previous injury.  He denies any pain or fatigue in his right leg, denies non healing wounds, denies rest pain in his legs.    Diabetic: No Tobacco Use: smoker  (3/4 ppd, started about 1974)  Pt meds include: Statin :Yes Betablocker: No ASA: Yes Other anticoagulants/antiplatelets: no     Past Medical History:  Diagnosis Date  . Hearing loss   . Hyperlipidemia   . Hypertension   . Renal disorder     History reviewed. No pertinent surgical history.  Current Meds   Medication Sig  . amLODipine (NORVASC) 10 MG tablet Take 10 mg by mouth daily.  . cloNIDine (CATAPRES) 0.1 MG tablet TAKE 1 TABLET BY MOUTH EVERY MORNING AND AT BEDTIME  . Multiple Vitamins-Minerals (CENTRUM ADULTS) TABS Take 1 tablet by mouth daily.  . simvastatin (ZOCOR) 10 MG tablet TAKE 1 TABLET BY MOUTH EVERYDAY AT BEDTIME    12 system ROS was negative unless otherwise noted in HPI   Observations/Objective:  DATA  ABI (Date: 07/26/2019): ABI Findings: +---------+------------------+-----+--------+--------+ Right    Rt Pressure (mmHg)IndexWaveformComment  +---------+------------------+-----+--------+--------+ Brachial 149                                     +---------+------------------+-----+--------+--------+ PTA      137               0.90 biphasic         +---------+------------------+-----+--------+--------+ DP       116               0.76 biphasic         +---------+------------------+-----+--------+--------+ Great Toe80                0.52                  +---------+------------------+-----+--------+--------+  +---------+------------------+-----+----------+-------+ Left     Lt Pressure (mmHg)IndexWaveform  Comment +---------+------------------+-----+----------+-------+ Brachial 153                                      +---------+------------------+-----+----------+-------+  PTA      101               0.66 monophasic        +---------+------------------+-----+----------+-------+ DP       108               0.71 monophasic        +---------+------------------+-----+----------+-------+ Great Toe50                0.33                   +---------+------------------+-----+----------+-------+  +-------+-----------+-----------+------------+------------+ ABI/TBIToday's ABIToday's TBIPrevious ABIPrevious TBI +-------+-----------+-----------+------------+------------+ Right  0.90       0.52       0.91         0.55         +-------+-----------+-----------+------------+------------+ Left   0.71       0.33       0.70        0.42         +-------+-----------+-----------+------------+------------+ Bilateral ABIs and TBIs appear essentially unchanged compared to prior study on 02/03/2018.   Summary: Right: Resting right ankle-brachial index indicates mild right lower extremity arterial disease. The right toe-brachial index is abnormal. RT great toe pressure = 80 mmHg.  Left: Resting left ankle-brachial index indicates moderate left lower extremity arterial disease. The left toe-brachial index is abnormal. LT Great toe pressure = 50 mmHg.   Assessment and Plan: Pt no longer c/o left lower extremity claudication symptoms. His right hip becomes painful after walking the length of a football field. He has known lumbar spine and S1 issues. He has not seen a specialist re his back since moving from [place]. I advised him to speak with his PCP re a possible referral to a spine specialist in this area. ABI's today show mild disease in the right leg with biphasic waveforms, moderate disease in the left with monophasic waveforms. Therefore, his right hip pain is not likely from lack of arterial perfusion.   His primary atherosclerotic risk factor remains smoking since he does not have DM. He takes a daily statin and 81 mg ASA.   I discussed with him how to achieve a graduated walking program.   Over 3 minutes was spent counseling patient re smoking cessation, and patient was given some free resources re smoking cessation.   Follow Up Instructions:   Follow up in 1 year with ABI's. I advised him to notify us if he develops concerns re worsening circulation in his feet or legs.    I discussed the assessment and treatment plan with the patient. The patient was provided an opportunity to ask questions and all were answered. The patient agreed with the plan and demonstrated an understanding of  the instructions.   The patient was advised to call back or seek an in-person evaluation if the symptoms worsen or if the condition fails to improve as anticipated.  I spent 12 minutes with the patient via telephone encounter.   Gabrielle Dare  [facility] Office: [phone]  07/26/2019, 5:08 PM

## 2019-08-04 ENCOUNTER — Other Ambulatory Visit: Payer: Self-pay

## 2019-08-04 ENCOUNTER — Encounter: Payer: Self-pay | Admitting: Podiatry

## 2019-08-04 ENCOUNTER — Ambulatory Visit (INDEPENDENT_AMBULATORY_CARE_PROVIDER_SITE_OTHER): Payer: Medicare Other | Admitting: Podiatry

## 2019-08-04 DIAGNOSIS — D689 Coagulation defect, unspecified: Secondary | ICD-10-CM

## 2019-08-04 DIAGNOSIS — B351 Tinea unguium: Secondary | ICD-10-CM | POA: Diagnosis not present

## 2019-08-04 DIAGNOSIS — M79676 Pain in unspecified toe(s): Secondary | ICD-10-CM

## 2019-08-04 NOTE — Progress Notes (Signed)
Patient ID: Jorge Jefferson, male   DOB: 01/29/1946, 72 y.o.   MRN: [id number] Complaint:  Visit Type: Patient returns to my office for continued preventative foot care services. Complaint: Patient states" my nails have grown long and thick and become painful to walk and wear shoes" . He presents for preventative foot care services. No changes to ROS. Patient is taking pletal.  Podiatric Exam: Vascular: dorsalis pedis and posterior tibial pulses are palpable bilateral. Capillary return is immediate. Temperature gradient is WNL. Skin turgor WNL  Sensorium: Normal Semmes Weinstein monofilament test. Normal tactile sensation bilaterally. Tingling from sural nerve left foot. Nail Exam: Pt has thick disfigured discolored nails with subungual debris noted bilateral entire nail hallux through fifth toenails Ulcer Exam: There is no evidence of ulcer or pre-ulcerative changes or infection. Orthopedic Exam: Muscle tone and strength are WNL. No limitations in general ROM. 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 Skin: No Porokeratosis. No infection or ulcers.    Diagnosis:  Onychomycosis  Pain in right toe, pain in left toes  Treatment & Plan Procedures and Treatment: Consent by patient was obtained for treatment procedures. The patient understood the discussion of treatment and procedures well. All questions were answered thoroughly reviewed. Debridement of mycotic and hypertrophic toenails, 1 through 5 bilateral and clearing of subungual debris. No ulceration, no infection noted.   Return Visit-Office Procedure: Patient instructed to return to the office for a follow up visit 10 weeks  for continued evaluation and treatment.  Philopater Mucha DPM 

## 2019-09-11 ENCOUNTER — Other Ambulatory Visit: Payer: Self-pay

## 2019-09-11 ENCOUNTER — Ambulatory Visit (INDEPENDENT_AMBULATORY_CARE_PROVIDER_SITE_OTHER): Payer: Medicare Other | Admitting: Otolaryngology

## 2019-10-18 ENCOUNTER — Other Ambulatory Visit: Payer: Self-pay

## 2019-10-18 ENCOUNTER — Ambulatory Visit (INDEPENDENT_AMBULATORY_CARE_PROVIDER_SITE_OTHER): Payer: Medicare Other | Admitting: Podiatry

## 2019-10-18 ENCOUNTER — Encounter: Payer: Self-pay | Admitting: Podiatry

## 2019-10-18 DIAGNOSIS — M79676 Pain in unspecified toe(s): Secondary | ICD-10-CM

## 2019-10-18 DIAGNOSIS — B351 Tinea unguium: Secondary | ICD-10-CM

## 2019-10-18 DIAGNOSIS — D689 Coagulation defect, unspecified: Secondary | ICD-10-CM

## 2019-10-18 NOTE — Progress Notes (Signed)
Patient ID: GUY SEESE, male   DOB: 1946-08-18, 73 y.o.   MRN: [id number] Complaint:  Visit Type: Patient returns to my office for continued preventative foot care services. Complaint: Patient states" my nails have grown long and thick and become painful to walk and wear shoes" . He presents for preventative foot care services. No changes to ROS. Patient is taking pletal.  Podiatric Exam: Vascular: dorsalis pedis and posterior tibial pulses are palpable bilateral. Capillary return is immediate. Temperature gradient is WNL. Skin turgor WNL  Sensorium: Normal Semmes Weinstein monofilament test. Normal tactile sensation bilaterally. Tingling from sural nerve left foot. Nail Exam: Pt has thick disfigured discolored nails with subungual debris noted bilateral entire nail hallux through fifth toenails.  Pain along medial border right hallux.  No infection. Ulcer Exam: There is no evidence of ulcer or pre-ulcerative changes or infection. Orthopedic Exam: Muscle tone and strength are WNL. No limitations in general ROM. 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 Skin: No Porokeratosis. No infection or ulcers.    Diagnosis:  Onychomycosis  Pain in right toe, pain in left toes  Treatment & Plan Procedures and Treatment: Consent by patient was obtained for treatment procedures. The patient understood the discussion of treatment and procedures well. All questions were answered thoroughly reviewed. Debridement of mycotic and hypertrophic toenails, 1 through 5 bilateral and clearing of subungual debris. No ulceration, no infection noted.   Return Visit-Office Procedure: Patient instructed to return to the office for a follow up visit 10 weeks  for continued evaluation and treatment.  Gardiner Barefoot DPM

## 2019-11-08 ENCOUNTER — Telehealth: Payer: Self-pay | Admitting: Podiatry

## 2019-11-08 NOTE — Telephone Encounter (Signed)
Pt called stating he is having pain in his great toenail and believes it might be an ingrown. Pt was offered an appt but insisted that he speak with the doctor/nurse beforehand. Please give patient a call

## 2019-11-08 NOTE — Telephone Encounter (Signed)
Unable to leave a message on home phone the line was busy.

## 2019-11-08 NOTE — Telephone Encounter (Signed)
Left message on mobile phone informing pt that if he felt he had an ingrown toenail he should make an appt to be evaluated so it could be treated before there was a breakdown in the skin and a possible infection or inflammation.

## 2019-11-14 ENCOUNTER — Other Ambulatory Visit: Payer: Self-pay

## 2019-11-14 ENCOUNTER — Ambulatory Visit (INDEPENDENT_AMBULATORY_CARE_PROVIDER_SITE_OTHER): Payer: Medicare Other | Admitting: Podiatry

## 2019-11-14 ENCOUNTER — Encounter: Payer: Self-pay | Admitting: Podiatry

## 2019-11-14 DIAGNOSIS — L03031 Cellulitis of right toe: Secondary | ICD-10-CM

## 2019-11-14 NOTE — Progress Notes (Signed)
Subjective:  Patient ID: Jorge Jefferson, male    DOB: 14-Mar-1946,  MRN: [id number] HPI Chief Complaint  Patient presents with  . Toe Pain    Hallux right - medial border - tender for weeks intermittently    74 y.o. male presents with the above complaint.   ROS: Denies fever chills nausea vomiting muscle aches pains calf pain back pain chest pain shortness of breath.  Past Medical History:  Diagnosis Date  . Hearing loss   . Hyperlipidemia   . Hypertension   . Renal disorder    No past surgical history on file.  Current Outpatient Medications:  .  amLODipine (NORVASC) 10 MG tablet, Take 10 mg by mouth daily., Disp: , Rfl: 2 .  ciprofloxacin (CILOXAN) 0.3 % ophthalmic solution, 4 DROPS IN EAR TWICE A DAY FOR 7 DAYS AS NEEDED FOR DRAINING, Disp: , Rfl:  .  cloNIDine (CATAPRES) 0.1 MG tablet, TAKE 1 TABLET BY MOUTH EVERY MORNING AND AT BEDTIME, Disp: , Rfl:  .  metFORMIN (GLUCOPHAGE) 500 MG tablet, Take 500 mg by mouth daily., Disp: , Rfl:  .  Multiple Vitamins-Minerals (CENTRUM ADULTS) TABS, Take 1 tablet by mouth daily., Disp: , Rfl:  .  prednisoLONE acetate (PRED FORTE) 1 % ophthalmic suspension, SMARTSIG:4 Drop(s) Right Ear Twice Daily, Disp: , Rfl:  .  simvastatin (ZOCOR) 10 MG tablet, TAKE 1 TABLET BY MOUTH EVERYDAY AT BEDTIME, Disp: 90 tablet, Rfl: 4  No Known Allergies Review of Systems Objective:  There were no vitals filed for this visit.  General: Well developed, nourished, in no acute distress, alert and oriented x3   Dermatological: Skin is warm, dry and supple bilateral. Nails x 10 are well maintained; remaining integument appears unremarkable at this time. There are no open sores, no preulcerative lesions, no rash or signs of infection present.  Is a sharp incurvated tibial border of the hallux right with erythema and edema a small area of granulation tissue exquisitely tender on palpation.  Vascular: Dorsalis Pedis artery and Posterior Tibial artery pedal  pulses are nonpalpable bilateral with immedate capillary fill time. Pedal hair growth absent.  No varicosities and no lower extremity edema present bilateral.  He has a history of peripheral vascular disease however his TBI of the right hallux is 80 from a recent vascular exam.  Neruologic: Grossly intact via light touch bilateral. Vibratory intact via tuning fork bilateral. Protective threshold with Semmes Wienstein monofilament intact to all pedal sites bilateral. Patellar and Achilles deep tendon reflexes 2+ bilateral. No Babinski or clonus noted bilateral.   Musculoskeletal: No gross boney pedal deformities bilateral. No pain, crepitus, or limitation noted with foot and ankle range of motion bilateral. Muscular strength 5/5 in all groups tested bilateral.  Gait: Unassisted, Nonantalgic.    Radiographs:  None taken  Assessment & Plan:   Assessment: Paronychia hallux right.  Peripheral vascular disease  Plan: Discussed etiology pathology conservative versus surgical therapies at this point he understands the risks of performing a small procedure like this.  We are going to perform a incision and drainage and remove all necrotic tissue this to be performed after local anesthetic was administered.  After the time was numb the procedure was performed there was no purulence no malodor all necrotic tissue was sharply resected along with a small portion of the tibial border.  I see no signs of bony infection and there does not appear to be any ischemia in the wound.  He was dressed with a dry sterile compressive  dressing was provided both oral and written home-going instruction for the care and soaking of the toe I do not feel that antibiotics are necessary for the complete resection of the necrotic tissue.  I will follow-up with him in 2 weeks he will notify us with questions or concerns.     Jocelynn Gioffre T. [place], [place]

## 2019-11-14 NOTE — Patient Instructions (Addendum)

## 2019-11-28 DIAGNOSIS — E1169 Type 2 diabetes mellitus with other specified complication: Secondary | ICD-10-CM | POA: Diagnosis not present

## 2019-11-30 ENCOUNTER — Other Ambulatory Visit: Payer: Self-pay

## 2019-11-30 ENCOUNTER — Encounter: Payer: Self-pay | Admitting: Podiatry

## 2019-11-30 ENCOUNTER — Ambulatory Visit (INDEPENDENT_AMBULATORY_CARE_PROVIDER_SITE_OTHER): Payer: Medicare Other | Admitting: Podiatry

## 2019-11-30 DIAGNOSIS — Z9889 Other specified postprocedural states: Secondary | ICD-10-CM

## 2019-11-30 DIAGNOSIS — L03031 Cellulitis of right toe: Secondary | ICD-10-CM

## 2019-11-30 NOTE — Progress Notes (Signed)
He presents today for follow-up of procedure hallux right states that is doing good.  Objective: Vital signs are stable he is alert and oriented x3 there is no erythema edema cellulitis drainage or odor.  Assessment: Well-healing surgical foot.  Plan: Continue soak Epson salt warm water hallux right times about 3 days then discontinue follow-up with me as needed.

## 2019-12-26 DIAGNOSIS — I10 Essential (primary) hypertension: Secondary | ICD-10-CM | POA: Diagnosis not present

## 2019-12-26 DIAGNOSIS — E1169 Type 2 diabetes mellitus with other specified complication: Secondary | ICD-10-CM | POA: Diagnosis not present

## 2019-12-27 ENCOUNTER — Encounter: Payer: Self-pay | Admitting: Podiatry

## 2019-12-27 ENCOUNTER — Ambulatory Visit (INDEPENDENT_AMBULATORY_CARE_PROVIDER_SITE_OTHER): Payer: Medicare Other | Admitting: Podiatry

## 2019-12-27 ENCOUNTER — Other Ambulatory Visit: Payer: Self-pay

## 2019-12-27 VITALS — Temp 97.7°F

## 2019-12-27 DIAGNOSIS — M79676 Pain in unspecified toe(s): Secondary | ICD-10-CM | POA: Diagnosis not present

## 2019-12-27 DIAGNOSIS — D689 Coagulation defect, unspecified: Secondary | ICD-10-CM | POA: Diagnosis not present

## 2019-12-27 DIAGNOSIS — B351 Tinea unguium: Secondary | ICD-10-CM | POA: Diagnosis not present

## 2019-12-27 NOTE — Progress Notes (Signed)
This patient returns to my office for at risk foot care.  This patient requires this care by a professional since this patient will be at risk due to having  Renal disease. Previous nail surgery for medial border right big toe.  No pain today. This patient is unable to cut nails themselves since the patient cannot reach their nails.These nails are painful walking and wearing shoes.  This patient presents for at risk foot care today.  General Appearance  Alert, conversant and in no acute stress.  Vascular  Dorsalis pedis and posterior tibial  pulses are palpable  bilaterally.  Capillary return is within normal limits  bilaterally. Temperature is within normal limits  bilaterally.  Neurologic  Senn-Weinstein monofilament wire test within normal limits  bilaterally. Muscle power within normal limits bilaterally.  Nails Thick disfigured discolored nails with subungual debris  from hallux to fifth toes bilaterally. No evidence of bacterial infection or drainage bilaterally.  Orthopedic  No limitations of motion  feet .  No crepitus or effusions noted.  No bony pathology or digital deformities noted.  HAV  B/L.  Hammer toes 2  B/L.  IPJ  DJD right hallux.    Skin  normotropic skin with no porokeratosis noted bilaterally.  No signs of infections or ulcers noted.     Onychomycosis  Pain in right toe  Pain in left toe.  Consent was obtained for treatment procedures.  Debridement and grinding of long thick nails with clearing of subungual debris.  No infection or ulcer.     Return office visit 10 weeks         Told patient to return for periodic foot care and evaluation due to potential at risk complications.   Gardiner Barefoot DPM

## 2020-01-05 ENCOUNTER — Other Ambulatory Visit: Payer: Self-pay | Admitting: Vascular Surgery

## 2020-01-05 ENCOUNTER — Other Ambulatory Visit: Payer: Self-pay

## 2020-01-05 MED ORDER — SIMVASTATIN 10 MG PO TABS: ORAL_TABLET | ORAL | 4 refills | Status: DC

## 2020-02-13 DIAGNOSIS — H7203 Central perforation of tympanic membrane, bilateral: Secondary | ICD-10-CM | POA: Diagnosis not present

## 2020-02-13 DIAGNOSIS — H9011 Conductive hearing loss, unilateral, right ear, with unrestricted hearing on the contralateral side: Secondary | ICD-10-CM | POA: Diagnosis not present

## 2020-02-16 DIAGNOSIS — N182 Chronic kidney disease, stage 2 (mild): Secondary | ICD-10-CM | POA: Diagnosis not present

## 2020-02-16 DIAGNOSIS — I129 Hypertensive chronic kidney disease with stage 1 through stage 4 chronic kidney disease, or unspecified chronic kidney disease: Secondary | ICD-10-CM | POA: Diagnosis not present

## 2020-02-16 DIAGNOSIS — E785 Hyperlipidemia, unspecified: Secondary | ICD-10-CM | POA: Diagnosis not present

## 2020-02-16 DIAGNOSIS — E1122 Type 2 diabetes mellitus with diabetic chronic kidney disease: Secondary | ICD-10-CM | POA: Diagnosis not present

## 2020-02-20 DIAGNOSIS — E1169 Type 2 diabetes mellitus with other specified complication: Secondary | ICD-10-CM | POA: Diagnosis not present

## 2020-02-20 DIAGNOSIS — Z6831 Body mass index (BMI) 31.0-31.9, adult: Secondary | ICD-10-CM | POA: Diagnosis not present

## 2020-02-20 DIAGNOSIS — I10 Essential (primary) hypertension: Secondary | ICD-10-CM | POA: Diagnosis not present

## 2020-03-27 ENCOUNTER — Ambulatory Visit (INDEPENDENT_AMBULATORY_CARE_PROVIDER_SITE_OTHER): Payer: Medicare Other | Admitting: Podiatry

## 2020-03-27 ENCOUNTER — Encounter: Payer: Self-pay | Admitting: Podiatry

## 2020-03-27 ENCOUNTER — Other Ambulatory Visit: Payer: Self-pay

## 2020-03-27 DIAGNOSIS — D689 Coagulation defect, unspecified: Secondary | ICD-10-CM | POA: Diagnosis not present

## 2020-03-27 DIAGNOSIS — B351 Tinea unguium: Secondary | ICD-10-CM | POA: Diagnosis not present

## 2020-03-27 DIAGNOSIS — M79676 Pain in unspecified toe(s): Secondary | ICD-10-CM

## 2020-03-27 NOTE — Progress Notes (Signed)
This patient returns to my office for at risk foot care.  This patient requires this care by a professional since this patient will be at risk due to having  Renal disease.   This patient is unable to cut nails himselves since the patient cannot reach his  nails.These nails are painful walking and wearing shoes.  This patient presents for at risk foot care today.  General Appearance  Alert, conversant and in no acute stress.  Vascular  Dorsalis pedis and posterior tibial  pulses are palpable  bilaterally.  Capillary return is within normal limits  bilaterally. Temperature is within normal limits  bilaterally.  Neurologic  Senn-Weinstein monofilament wire test within normal limits  bilaterally. Muscle power within normal limits bilaterally.  Nails Thick disfigured discolored nails with subungual debris  from hallux to fifth toes bilaterally. No evidence of bacterial infection or drainage bilaterally.  Orthopedic  No limitations of motion  feet .  No crepitus or effusions noted.  No bony pathology or digital deformities noted.  HAV  B/L.  Hammer toes 2  B/L.  IPJ  DJD right hallux.    Skin  normotropic skin with no porokeratosis noted bilaterally.  No signs of infections or ulcers noted.     Onychomycosis  Pain in right toe  Pain in left toe.  Consent was obtained for treatment procedures.  Debridement and grinding of long thick nails with clearing of subungual debris with nail nipper and dremel tool.  No infection or ulcer.     Return office visit 10 weeks         Told patient to return for periodic foot care and evaluation due to potential at risk complications.   Gardiner Barefoot DPM

## 2020-04-23 DIAGNOSIS — E1169 Type 2 diabetes mellitus with other specified complication: Secondary | ICD-10-CM | POA: Diagnosis not present

## 2020-04-23 DIAGNOSIS — I10 Essential (primary) hypertension: Secondary | ICD-10-CM | POA: Diagnosis not present

## 2020-04-23 DIAGNOSIS — E781 Pure hyperglyceridemia: Secondary | ICD-10-CM | POA: Diagnosis not present

## 2020-04-23 DIAGNOSIS — Z6831 Body mass index (BMI) 31.0-31.9, adult: Secondary | ICD-10-CM | POA: Diagnosis not present

## 2020-05-17 DIAGNOSIS — E1122 Type 2 diabetes mellitus with diabetic chronic kidney disease: Secondary | ICD-10-CM | POA: Diagnosis not present

## 2020-05-17 DIAGNOSIS — N182 Chronic kidney disease, stage 2 (mild): Secondary | ICD-10-CM | POA: Diagnosis not present

## 2020-05-17 DIAGNOSIS — I129 Hypertensive chronic kidney disease with stage 1 through stage 4 chronic kidney disease, or unspecified chronic kidney disease: Secondary | ICD-10-CM | POA: Diagnosis not present

## 2020-05-17 DIAGNOSIS — E785 Hyperlipidemia, unspecified: Secondary | ICD-10-CM | POA: Diagnosis not present

## 2020-05-24 DIAGNOSIS — R748 Abnormal levels of other serum enzymes: Secondary | ICD-10-CM | POA: Diagnosis not present

## 2020-05-24 DIAGNOSIS — K760 Fatty (change of) liver, not elsewhere classified: Secondary | ICD-10-CM | POA: Diagnosis not present

## 2020-05-24 DIAGNOSIS — B182 Chronic viral hepatitis C: Secondary | ICD-10-CM | POA: Diagnosis not present

## 2020-06-12 ENCOUNTER — Encounter: Payer: Self-pay | Admitting: Podiatry

## 2020-06-12 ENCOUNTER — Other Ambulatory Visit: Payer: Self-pay

## 2020-06-12 ENCOUNTER — Ambulatory Visit (INDEPENDENT_AMBULATORY_CARE_PROVIDER_SITE_OTHER): Payer: Medicare Other | Admitting: Podiatry

## 2020-06-12 DIAGNOSIS — B351 Tinea unguium: Secondary | ICD-10-CM

## 2020-06-12 DIAGNOSIS — D689 Coagulation defect, unspecified: Secondary | ICD-10-CM

## 2020-06-12 DIAGNOSIS — M79676 Pain in unspecified toe(s): Secondary | ICD-10-CM

## 2020-06-12 NOTE — Progress Notes (Signed)
This patient returns to my office for at risk foot care.  This patient requires this care by a professional since this patient will be at risk due to having  Renal disease.   This patient is unable to cut nails himselves since the patient cannot reach his  nails.These nails are painful walking and wearing shoes.  This patient presents for at risk foot care today.  General Appearance  Alert, conversant and in no acute stress.  Vascular  Dorsalis pedis and posterior tibial  pulses are palpable  bilaterally.  Capillary return is within normal limits  bilaterally. Temperature is within normal limits  bilaterally.  Neurologic  Senn-Weinstein monofilament wire test within normal limits  bilaterally. Muscle power within normal limits bilaterally.  Nails Thick disfigured discolored nails with subungual debris  from hallux to fifth toes bilaterally. No evidence of bacterial infection or drainage bilaterally.  Orthopedic  No limitations of motion  feet .  No crepitus or effusions noted.  No bony pathology or digital deformities noted.  HAV  B/L.  Hammer toes 2  B/L.  IPJ  DJD right hallux.    Skin  normotropic skin with no porokeratosis noted bilaterally.  No signs of infections or ulcers noted.     Onychomycosis  Pain in right toe  Pain in left toe.  Consent was obtained for treatment procedures.  Debridement and grinding of long thick nails with clearing of subungual debris with nail nipper and dremel tool.  No infection or ulcer.     Return office visit 10 weeks         Told patient to return for periodic foot care and evaluation due to potential at risk complications.   Gardiner Barefoot DPM

## 2020-07-04 DIAGNOSIS — Z0000 Encounter for general adult medical examination without abnormal findings: Secondary | ICD-10-CM | POA: Diagnosis not present

## 2020-07-11 DIAGNOSIS — B182 Chronic viral hepatitis C: Secondary | ICD-10-CM | POA: Diagnosis not present

## 2020-07-11 DIAGNOSIS — K7402 Hepatic fibrosis, advanced fibrosis: Secondary | ICD-10-CM | POA: Diagnosis not present

## 2020-07-11 DIAGNOSIS — R748 Abnormal levels of other serum enzymes: Secondary | ICD-10-CM | POA: Diagnosis not present

## 2020-07-12 ENCOUNTER — Other Ambulatory Visit: Payer: Self-pay | Admitting: Nurse Practitioner

## 2020-07-12 DIAGNOSIS — B1920 Unspecified viral hepatitis C without hepatic coma: Secondary | ICD-10-CM

## 2020-07-23 ENCOUNTER — Ambulatory Visit
Admission: RE | Admit: 2020-07-23 | Discharge: 2020-07-23 | Disposition: A | Discharge status: Home / Self Care | Payer: Medicare Other | Source: Ambulatory Visit | Attending: Nurse Practitioner | Admitting: Nurse Practitioner

## 2020-07-23 DIAGNOSIS — I714 Abdominal aortic aneurysm, without rupture: Secondary | ICD-10-CM | POA: Diagnosis not present

## 2020-07-23 DIAGNOSIS — B1920 Unspecified viral hepatitis C without hepatic coma: Secondary | ICD-10-CM

## 2020-07-23 DIAGNOSIS — N281 Cyst of kidney, acquired: Secondary | ICD-10-CM | POA: Diagnosis not present

## 2020-07-23 DIAGNOSIS — K7689 Other specified diseases of liver: Secondary | ICD-10-CM | POA: Diagnosis not present

## 2020-07-23 DIAGNOSIS — K7460 Unspecified cirrhosis of liver: Secondary | ICD-10-CM | POA: Diagnosis not present

## 2020-07-29 DIAGNOSIS — Z794 Long term (current) use of insulin: Secondary | ICD-10-CM | POA: Diagnosis not present

## 2020-07-29 DIAGNOSIS — I10 Essential (primary) hypertension: Secondary | ICD-10-CM | POA: Diagnosis not present

## 2020-07-29 DIAGNOSIS — E1169 Type 2 diabetes mellitus with other specified complication: Secondary | ICD-10-CM | POA: Diagnosis not present

## 2020-07-29 DIAGNOSIS — I129 Hypertensive chronic kidney disease with stage 1 through stage 4 chronic kidney disease, or unspecified chronic kidney disease: Secondary | ICD-10-CM | POA: Diagnosis not present

## 2020-07-29 DIAGNOSIS — N184 Chronic kidney disease, stage 4 (severe): Secondary | ICD-10-CM | POA: Diagnosis not present

## 2020-07-29 DIAGNOSIS — Z23 Encounter for immunization: Secondary | ICD-10-CM | POA: Diagnosis not present

## 2020-07-29 DIAGNOSIS — B1920 Unspecified viral hepatitis C without hepatic coma: Secondary | ICD-10-CM | POA: Diagnosis not present

## 2020-07-29 DIAGNOSIS — Z6831 Body mass index (BMI) 31.0-31.9, adult: Secondary | ICD-10-CM | POA: Diagnosis not present

## 2020-08-09 ENCOUNTER — Other Ambulatory Visit: Payer: Self-pay

## 2020-08-09 DIAGNOSIS — I70213 Atherosclerosis of native arteries of extremities with intermittent claudication, bilateral legs: Secondary | ICD-10-CM

## 2020-08-12 ENCOUNTER — Ambulatory Visit (HOSPITAL_COMMUNITY)
Admission: RE | Admit: 2020-08-12 | Discharge: 2020-08-12 | Disposition: A | Discharge status: Home / Self Care | Payer: Medicare Other | Source: Ambulatory Visit | Attending: Physician Assistant | Admitting: Physician Assistant

## 2020-08-12 ENCOUNTER — Ambulatory Visit (INDEPENDENT_AMBULATORY_CARE_PROVIDER_SITE_OTHER): Payer: Medicare Other | Admitting: Physician Assistant

## 2020-08-12 ENCOUNTER — Other Ambulatory Visit: Payer: Self-pay

## 2020-08-12 VITALS — BP 131/80 | HR 68 | Temp 98.2°F | Resp 20 | Ht >= 80 in | Wt 268.7 lb

## 2020-08-12 DIAGNOSIS — I739 Peripheral vascular disease, unspecified: Secondary | ICD-10-CM | POA: Diagnosis not present

## 2020-08-12 DIAGNOSIS — I70213 Atherosclerosis of native arteries of extremities with intermittent claudication, bilateral legs: Secondary | ICD-10-CM | POA: Insufficient documentation

## 2020-08-12 NOTE — Progress Notes (Signed)
Established Intermittent Claudication   History of Present Illness   Jorge Jefferson is a 74 y.o. (08-09-1946) male who presents with chief complaint: PAD.  Historically patient has been seen in this office on an annual basis.  He has a known left SFA occlusion.  Patient denies any claudication symptoms of his left lower extremity today.  He also denies any rest pain or nonhealing wounds of bilateral lower extremities.  He complains more of symmetrical hip and lower back pain when walking.  He has a history of lumbar stenosis.  Symptoms are relieved when he leans over the cart when walking up and down the [place] in the grocery store.  The symptoms do not bother him enough to seek treatment however.  He is taking aspirin and statin daily.  He denies tobacco use.  The patient's PMH, PSH, SH, and FamHx were reviewed and are unchanged from prior visit.  Current Outpatient Medications  Medication Sig Dispense Refill  . amLODipine (NORVASC) 10 MG tablet Take 10 mg by mouth daily.  2  . Blood Glucose Monitoring Suppl (ONE TOUCH ULTRA 2) w/Device KIT     . cloNIDine (CATAPRES) 0.1 MG tablet TAKE 1 TABLET BY MOUTH EVERY MORNING AND AT BEDTIME    . Lancets (ONETOUCH DELICA PLUS [id number]) MISC     . Multiple Vitamins-Minerals (CENTRUM ADULTS) TABS Take 1 tablet by mouth daily.    . nicotine (NICODERM CQ - DOSED IN MG/24 HOURS) 21 mg/24hr patch 21 mg daily.    Glory Rosebush ULTRA test strip     . prednisoLONE acetate (PRED FORTE) 1 % ophthalmic suspension SMARTSIG:4 Drop(s) Right Ear Twice Daily    . simvastatin (ZOCOR) 10 MG tablet TAKE 1 TABLET BY MOUTH EVERYDAY AT BEDTIME 90 tablet 4  . Sofosbuvir-Velpatasvir 400-100 MG TABS Take 1 tablet by mouth daily.    . ciprofloxacin (CILOXAN) 0.3 % ophthalmic solution 4 DROPS IN EAR TWICE A DAY FOR 7 DAYS AS NEEDED FOR DRAINING (Patient not taking: Reported on 08/12/2020)    . metFORMIN (GLUCOPHAGE) 500 MG tablet Take 500 mg by mouth daily. (Patient not  taking: Reported on 08/12/2020)     No current facility-administered medications for this visit.    REVIEW OF SYSTEMS (negative unless checked):   Cardiac:  _0  Chest pain or chest pressure? _1  Shortness of breath upon activity? _2  Shortness of breath when lying flat? _3  Irregular heart rhythm?  Vascular:  _4  Pain in calf, thigh, or hip brought on by walking? _5  Pain in feet at night that wakes you up from your sleep? _6  Blood clot in your veins? _7  Leg swelling?  Pulmonary:  _8  Oxygen at home? _9  Productive cough? _10  Wheezing?  Neurologic:  _11  Sudden weakness in arms or legs? _12  Sudden numbness in arms or legs? _13  Sudden onset of difficult speaking or slurred speech? _14  Temporary loss of vision in one eye? _15  Problems with dizziness?  Gastrointestinal:  _16  Blood in stool? _17  Vomited blood?  Genitourinary:  _18  Burning when urinating? _19  Blood in urine?  Psychiatric:  _20  Major depression  Hematologic:  _21  Bleeding problems? _22  Problems with blood clotting?  Dermatologic:  _23  Rashes or ulcers?  Constitutional:  _24  Fever or chills?  Ear/Nose/Throat:  _25  Change in hearing? _26  Nose bleeds? _27  Sore throat?  Musculoskeletal:  _28  Back pain? _29  Joint pain? _30  Muscle pain?   Physical Examination   Vitals:   08/12/20 0837  BP: 131/80  Pulse: 68  Resp: 20  Temp: 98.2 F (36.8 C)  TempSrc: Temporal  SpO2: 97%  Weight: 268 lb 11.2 oz (121.9 kg)  Height: 6' 9.5" (2.07 m)   Body mass index is 28.44 kg/m.  General:  WDWN in NAD; vital signs documented above Gait: Not observed HENT: WNL, normocephalic Pulmonary: normal non-labored breathing , without Rales, rhonchi,  wheezing Cardiac: regular HR Abdomen: soft, NT, no masses Skin: without rashes Vascular Exam/Pulses:  Right Left  Radial 2+ (normal) 2+ (normal)  DP absent absent  PT 2+ (normal) absent   Extremities: without ischemic changes, without Gangrene , without cellulitis; without open  wounds;  Musculoskeletal: no muscle wasting or atrophy  Neurologic: A&O X 3;  No focal weakness or paresthesias are detected Psychiatric:  The pt has Normal affect.  Non-Invasive Vascular imaging   ABI  ABI/TBIToday's ABIToday's TBIPrevious ABIPrevious TBI  +-------+-----------+-----------+------------+------------+  Right 1.00    0.74    0.90    0.52      +-------+-----------+-----------+------------+------------+  Left  0.64    0.48    0.71    0.33      Medical Decision Making   Jorge Jefferson is a 74 y.o. male who presents for surveillance of PAD   Patient with a known left SFA occlusion; patient denies any claudication, rest pain, or nonhealing wounds of bilateral lower extremities  No indication for revascularization of left SFA  ABIs unchanged over the past year; recheck ABIs in 1 year  Encouraged ambulation  Call/return office sooner if claudication, rest pain, or nonhealing wounds develop   Dagoberto Ligas PA-C [facility] Office: [facility] MD: Trula Slade

## 2020-08-15 DIAGNOSIS — B182 Chronic viral hepatitis C: Secondary | ICD-10-CM | POA: Diagnosis not present

## 2020-08-17 DIAGNOSIS — N182 Chronic kidney disease, stage 2 (mild): Secondary | ICD-10-CM | POA: Diagnosis not present

## 2020-08-17 DIAGNOSIS — E1122 Type 2 diabetes mellitus with diabetic chronic kidney disease: Secondary | ICD-10-CM | POA: Diagnosis not present

## 2020-08-17 DIAGNOSIS — I129 Hypertensive chronic kidney disease with stage 1 through stage 4 chronic kidney disease, or unspecified chronic kidney disease: Secondary | ICD-10-CM | POA: Diagnosis not present

## 2020-08-17 DIAGNOSIS — E785 Hyperlipidemia, unspecified: Secondary | ICD-10-CM | POA: Diagnosis not present

## 2020-09-11 DIAGNOSIS — Z23 Encounter for immunization: Secondary | ICD-10-CM | POA: Diagnosis not present

## 2020-09-17 ENCOUNTER — Encounter: Payer: Self-pay | Admitting: Podiatry

## 2020-09-17 ENCOUNTER — Other Ambulatory Visit: Payer: Self-pay

## 2020-09-17 ENCOUNTER — Ambulatory Visit (INDEPENDENT_AMBULATORY_CARE_PROVIDER_SITE_OTHER): Payer: Medicare Other | Admitting: Podiatry

## 2020-09-17 DIAGNOSIS — E785 Hyperlipidemia, unspecified: Secondary | ICD-10-CM | POA: Diagnosis not present

## 2020-09-17 DIAGNOSIS — M79676 Pain in unspecified toe(s): Secondary | ICD-10-CM

## 2020-09-17 DIAGNOSIS — I129 Hypertensive chronic kidney disease with stage 1 through stage 4 chronic kidney disease, or unspecified chronic kidney disease: Secondary | ICD-10-CM | POA: Diagnosis not present

## 2020-09-17 DIAGNOSIS — B351 Tinea unguium: Secondary | ICD-10-CM

## 2020-09-17 DIAGNOSIS — D689 Coagulation defect, unspecified: Secondary | ICD-10-CM

## 2020-09-17 DIAGNOSIS — E1122 Type 2 diabetes mellitus with diabetic chronic kidney disease: Secondary | ICD-10-CM | POA: Diagnosis not present

## 2020-09-17 DIAGNOSIS — N182 Chronic kidney disease, stage 2 (mild): Secondary | ICD-10-CM | POA: Diagnosis not present

## 2020-09-17 NOTE — Progress Notes (Signed)
This patient returns to my office for at risk foot care.  This patient requires this care by a professional since this patient will be at risk due to having  Renal disease.   This patient is unable to cut nails himselves since the patient cannot reach his  nails.These nails are painful walking and wearing shoes.  This patient presents for at risk foot care today.  General Appearance  Alert, conversant and in no acute stress.  Vascular  Dorsalis pedis and posterior tibial  pulses are palpable  bilaterally.  Capillary return is within normal limits  bilaterally. Temperature is within normal limits  bilaterally.  Neurologic  Senn-Weinstein monofilament wire test within normal limits  bilaterally. Muscle power within normal limits bilaterally.  Nails Thick disfigured discolored nails with subungual debris  from hallux to fifth toes bilaterally. No evidence of bacterial infection or drainage bilaterally.  Orthopedic  No limitations of motion  feet .  No crepitus or effusions noted.  No bony pathology or digital deformities noted.  HAV  B/L.  Hammer toes 2  B/L.  IPJ  DJD right hallux.    Skin  normotropic skin with no porokeratosis noted bilaterally.  No signs of infections or ulcers noted.     Onychomycosis  Pain in right toe  Pain in left toe.  Consent was obtained for treatment procedures.  Debridement and grinding of long thick nails with clearing of subungual debris with nail nipper and dremel tool.  No infection or ulcer.     Return office visit 10 weeks         Told patient to return for periodic foot care and evaluation due to potential at risk complications.   Gardiner Barefoot DPM

## 2020-10-08 DIAGNOSIS — B182 Chronic viral hepatitis C: Secondary | ICD-10-CM | POA: Diagnosis not present

## 2020-10-24 DIAGNOSIS — K7402 Hepatic fibrosis, advanced fibrosis: Secondary | ICD-10-CM | POA: Diagnosis not present

## 2020-10-24 DIAGNOSIS — K760 Fatty (change of) liver, not elsewhere classified: Secondary | ICD-10-CM | POA: Diagnosis not present

## 2020-10-24 DIAGNOSIS — B182 Chronic viral hepatitis C: Secondary | ICD-10-CM | POA: Diagnosis not present

## 2020-10-24 DIAGNOSIS — R748 Abnormal levels of other serum enzymes: Secondary | ICD-10-CM | POA: Diagnosis not present

## 2020-10-24 DIAGNOSIS — R7989 Other specified abnormal findings of blood chemistry: Secondary | ICD-10-CM | POA: Diagnosis not present

## 2020-11-27 ENCOUNTER — Other Ambulatory Visit: Payer: Self-pay

## 2020-11-27 ENCOUNTER — Encounter: Payer: Self-pay | Admitting: Podiatry

## 2020-11-27 ENCOUNTER — Ambulatory Visit (INDEPENDENT_AMBULATORY_CARE_PROVIDER_SITE_OTHER): Payer: Medicare Other | Admitting: Podiatry

## 2020-11-27 DIAGNOSIS — M79676 Pain in unspecified toe(s): Secondary | ICD-10-CM

## 2020-11-27 DIAGNOSIS — D689 Coagulation defect, unspecified: Secondary | ICD-10-CM | POA: Diagnosis not present

## 2020-11-27 DIAGNOSIS — B351 Tinea unguium: Secondary | ICD-10-CM | POA: Diagnosis not present

## 2020-11-27 NOTE — Progress Notes (Signed)
This patient returns to my office for at risk foot care.  This patient requires this care by a professional since this patient will be at risk due to having  Renal disease.   This patient is unable to cut nails himselves since the patient cannot reach his  nails.These nails are painful walking and wearing shoes.  This patient presents for at risk foot care today. The tip of the second toe left foot is painful to the touch.  General Appearance  Alert, conversant and in no acute stress.  Vascular  Dorsalis pedis and posterior tibial  pulses are palpable  bilaterally.  Capillary return is within normal limits  bilaterally. Temperature is within normal limits  bilaterally.  Neurologic  Senn-Weinstein monofilament wire test within normal limits  bilaterally. Muscle power within normal limits bilaterally.  Nails Thick disfigured discolored nails with subungual debris  from hallux to fifth toes bilaterally. No evidence of bacterial infection or drainage bilaterally.  Orthopedic  No limitations of motion  feet .  No crepitus or effusions noted.  No bony pathology or digital deformities noted.  HAV  B/L.  Hammer toes 2  B/L.  IPJ  DJD right hallux.    Skin  normotropic skin with no porokeratosis noted bilaterally.  No signs of infections or ulcers noted.     Onychomycosis  Pain in right toe  Pain in left toe.  Consent was obtained for treatment procedures.  Debridement and grinding of long thick nails with clearing of subungual debris with nail nipper and dremel tool.  No infection or ulcer.  Second toe painful due to hammer toe.  Padding dispensed.   Return office visit 10 weeks         Told patient to return for periodic foot care and evaluation due to potential at risk complications.   Gardiner Barefoot DPM

## 2020-12-02 DIAGNOSIS — M25551 Pain in right hip: Secondary | ICD-10-CM | POA: Diagnosis not present

## 2020-12-02 DIAGNOSIS — Z7189 Other specified counseling: Secondary | ICD-10-CM | POA: Diagnosis not present

## 2020-12-02 DIAGNOSIS — I10 Essential (primary) hypertension: Secondary | ICD-10-CM | POA: Diagnosis not present

## 2020-12-02 DIAGNOSIS — E785 Hyperlipidemia, unspecified: Secondary | ICD-10-CM | POA: Diagnosis not present

## 2020-12-02 DIAGNOSIS — E1169 Type 2 diabetes mellitus with other specified complication: Secondary | ICD-10-CM | POA: Diagnosis not present

## 2020-12-02 DIAGNOSIS — M25552 Pain in left hip: Secondary | ICD-10-CM | POA: Diagnosis not present

## 2020-12-05 ENCOUNTER — Ambulatory Visit (HOSPITAL_COMMUNITY)
Admission: RE | Admit: 2020-12-05 | Discharge: 2020-12-05 | Disposition: A | Discharge status: Home / Self Care | Payer: Medicare Other | Source: Ambulatory Visit | Attending: Family Medicine | Admitting: Family Medicine

## 2020-12-05 ENCOUNTER — Other Ambulatory Visit (HOSPITAL_COMMUNITY): Payer: Self-pay | Admitting: Family Medicine

## 2020-12-05 ENCOUNTER — Other Ambulatory Visit: Payer: Self-pay

## 2020-12-05 DIAGNOSIS — M1611 Unilateral primary osteoarthritis, right hip: Secondary | ICD-10-CM | POA: Diagnosis not present

## 2020-12-05 DIAGNOSIS — R29898 Other symptoms and signs involving the musculoskeletal system: Secondary | ICD-10-CM

## 2020-12-05 DIAGNOSIS — M1612 Unilateral primary osteoarthritis, left hip: Secondary | ICD-10-CM | POA: Diagnosis not present

## 2020-12-11 ENCOUNTER — Other Ambulatory Visit: Payer: Self-pay | Admitting: Vascular Surgery

## 2020-12-16 DIAGNOSIS — E1169 Type 2 diabetes mellitus with other specified complication: Secondary | ICD-10-CM | POA: Diagnosis not present

## 2021-01-02 DIAGNOSIS — K7402 Hepatic fibrosis, advanced fibrosis: Secondary | ICD-10-CM | POA: Diagnosis not present

## 2021-01-02 DIAGNOSIS — B182 Chronic viral hepatitis C: Secondary | ICD-10-CM | POA: Diagnosis not present

## 2021-01-16 ENCOUNTER — Other Ambulatory Visit: Payer: Self-pay | Admitting: Nurse Practitioner

## 2021-01-16 DIAGNOSIS — R7989 Other specified abnormal findings of blood chemistry: Secondary | ICD-10-CM | POA: Diagnosis not present

## 2021-01-16 DIAGNOSIS — K760 Fatty (change of) liver, not elsewhere classified: Secondary | ICD-10-CM | POA: Diagnosis not present

## 2021-01-16 DIAGNOSIS — K7402 Hepatic fibrosis, advanced fibrosis: Secondary | ICD-10-CM | POA: Diagnosis not present

## 2021-01-16 DIAGNOSIS — B182 Chronic viral hepatitis C: Secondary | ICD-10-CM | POA: Diagnosis not present

## 2021-01-21 ENCOUNTER — Other Ambulatory Visit: Payer: Self-pay

## 2021-01-21 ENCOUNTER — Encounter: Payer: Self-pay | Admitting: Podiatry

## 2021-01-21 ENCOUNTER — Ambulatory Visit (INDEPENDENT_AMBULATORY_CARE_PROVIDER_SITE_OTHER): Payer: Medicare Other | Admitting: Podiatry

## 2021-01-21 DIAGNOSIS — D689 Coagulation defect, unspecified: Secondary | ICD-10-CM

## 2021-01-21 DIAGNOSIS — B351 Tinea unguium: Secondary | ICD-10-CM | POA: Diagnosis not present

## 2021-01-21 DIAGNOSIS — M79676 Pain in unspecified toe(s): Secondary | ICD-10-CM | POA: Diagnosis not present

## 2021-01-21 NOTE — Progress Notes (Signed)
Presents today chief complaint of painful elongated toenails.  Toenails are long thick yellow dystrophic-like mycotic.  Objective: Nails are long thick yellow dystrophic-like mycotic painful in nature.  Pulses are palpable.  No open lesions or wounds are noted.  Assessment: Pain in limb secondary to onychomycosis.  Plan: Debridement of toenails 1 through 5 bilateral.  Jorge Jefferson will follow up with Dr. Adah Perl.

## 2021-02-05 ENCOUNTER — Ambulatory Visit: Payer: Medicare Other | Admitting: Podiatry

## 2021-02-06 ENCOUNTER — Ambulatory Visit
Admission: RE | Admit: 2021-02-06 | Discharge: 2021-02-06 | Disposition: A | Discharge status: Home / Self Care | Payer: Medicare Other | Source: Ambulatory Visit | Attending: Nurse Practitioner | Admitting: Nurse Practitioner

## 2021-02-06 DIAGNOSIS — K7402 Hepatic fibrosis, advanced fibrosis: Secondary | ICD-10-CM

## 2021-02-06 DIAGNOSIS — K7400 Hepatic fibrosis, unspecified: Secondary | ICD-10-CM | POA: Diagnosis not present

## 2021-02-06 DIAGNOSIS — B182 Chronic viral hepatitis C: Secondary | ICD-10-CM | POA: Diagnosis not present

## 2021-02-07 DIAGNOSIS — K7469 Other cirrhosis of liver: Secondary | ICD-10-CM | POA: Diagnosis not present

## 2021-03-03 DIAGNOSIS — E781 Pure hyperglyceridemia: Secondary | ICD-10-CM | POA: Diagnosis not present

## 2021-03-03 DIAGNOSIS — E785 Hyperlipidemia, unspecified: Secondary | ICD-10-CM | POA: Diagnosis not present

## 2021-03-03 DIAGNOSIS — I70213 Atherosclerosis of native arteries of extremities with intermittent claudication, bilateral legs: Secondary | ICD-10-CM | POA: Diagnosis not present

## 2021-03-03 DIAGNOSIS — E1169 Type 2 diabetes mellitus with other specified complication: Secondary | ICD-10-CM | POA: Diagnosis not present

## 2021-03-03 DIAGNOSIS — I10 Essential (primary) hypertension: Secondary | ICD-10-CM | POA: Diagnosis not present

## 2021-03-18 DIAGNOSIS — N182 Chronic kidney disease, stage 2 (mild): Secondary | ICD-10-CM | POA: Diagnosis not present

## 2021-03-18 DIAGNOSIS — E1122 Type 2 diabetes mellitus with diabetic chronic kidney disease: Secondary | ICD-10-CM | POA: Diagnosis not present

## 2021-03-18 DIAGNOSIS — E785 Hyperlipidemia, unspecified: Secondary | ICD-10-CM | POA: Diagnosis not present

## 2021-03-18 DIAGNOSIS — I129 Hypertensive chronic kidney disease with stage 1 through stage 4 chronic kidney disease, or unspecified chronic kidney disease: Secondary | ICD-10-CM | POA: Diagnosis not present

## 2021-04-17 DIAGNOSIS — I129 Hypertensive chronic kidney disease with stage 1 through stage 4 chronic kidney disease, or unspecified chronic kidney disease: Secondary | ICD-10-CM | POA: Diagnosis not present

## 2021-04-17 DIAGNOSIS — N182 Chronic kidney disease, stage 2 (mild): Secondary | ICD-10-CM | POA: Diagnosis not present

## 2021-04-17 DIAGNOSIS — E785 Hyperlipidemia, unspecified: Secondary | ICD-10-CM | POA: Diagnosis not present

## 2021-04-17 DIAGNOSIS — E1122 Type 2 diabetes mellitus with diabetic chronic kidney disease: Secondary | ICD-10-CM | POA: Diagnosis not present

## 2021-04-24 DIAGNOSIS — Z23 Encounter for immunization: Secondary | ICD-10-CM | POA: Diagnosis not present

## 2021-05-06 ENCOUNTER — Encounter: Payer: Self-pay | Admitting: Podiatry

## 2021-05-06 ENCOUNTER — Ambulatory Visit (INDEPENDENT_AMBULATORY_CARE_PROVIDER_SITE_OTHER): Payer: Medicare Other | Admitting: Podiatry

## 2021-05-06 DIAGNOSIS — M79676 Pain in unspecified toe(s): Secondary | ICD-10-CM | POA: Diagnosis not present

## 2021-05-06 DIAGNOSIS — B351 Tinea unguium: Secondary | ICD-10-CM | POA: Diagnosis not present

## 2021-05-09 NOTE — Progress Notes (Signed)
Subjective: Jorge Jefferson is a pleasant 75 y.o. male patient seen today for at risk foot care due to renal disease. He is seen for painful thick toenails that are difficult to trim. Pain interferes with ambulation. Aggravating factors include wearing enclosed shoe gear. Pain is relieved with periodic professional debridement.  PCP is Iona Beard, MD. Last visit was: 08/17/2020.  He voices no new pedal problems on today's visit.  No Known Allergies  Objective: Physical Exam  General: Jorge Jefferson is a pleasant 75 y.o. African American male, in NAD. AAO x 3.   Vascular:  Capillary refill time to digits immediate b/l. Palpable pedal pulses b/l LE. Pedal hair present. Lower extremity skin temperature gradient within normal limits. No pain with calf compression b/l. No edema noted b/l lower extremities.  Dermatological:  Pedal skin with normal turgor, texture and tone b/l lower extremities. No open wounds b/l lower extremities. No interdigital macerations b/l lower extremities. Toenails 1-5 b/l elongated, discolored, dystrophic, thickened, crumbly with subungual debris and tenderness to dorsal palpation.  Musculoskeletal:  Normal muscle strength 5/5 to all lower extremity muscle groups bilaterally. Hallux valgus with bunion deformity noted b/l lower extremities. Hammertoe(s) noted to the L 2nd toe and R 2nd toe. Limited joint ROM to the indicative of DJD right 1st MPJ.  Neurological:  Protective sensation intact 5/5 intact bilaterally with 10g monofilament b/l. Vibratory sensation intact b/l.  Assessment and Plan:  1. Pain due to onychomycosis of toenail    -No new findings. No new orders. -Patient to continue soft, supportive shoe gear daily. -Toenails 1-5 b/l were debrided in length and girth with sterile nail nippers and dremel without iatrogenic bleeding.  -Patient to report any pedal injuries to medical professional immediately. -Patient/POA to call should there be  question/concern in the interim.  Return in about 10 weeks (around 07/15/2021).  Marzetta Board, DPM

## 2021-05-20 DIAGNOSIS — H6122 Impacted cerumen, left ear: Secondary | ICD-10-CM | POA: Diagnosis not present

## 2021-05-20 DIAGNOSIS — H6983 Other specified disorders of Eustachian tube, bilateral: Secondary | ICD-10-CM | POA: Diagnosis not present

## 2021-05-20 DIAGNOSIS — H7203 Central perforation of tympanic membrane, bilateral: Secondary | ICD-10-CM | POA: Diagnosis not present

## 2021-06-18 DIAGNOSIS — N182 Chronic kidney disease, stage 2 (mild): Secondary | ICD-10-CM | POA: Diagnosis not present

## 2021-06-18 DIAGNOSIS — E1122 Type 2 diabetes mellitus with diabetic chronic kidney disease: Secondary | ICD-10-CM | POA: Diagnosis not present

## 2021-06-18 DIAGNOSIS — E785 Hyperlipidemia, unspecified: Secondary | ICD-10-CM | POA: Diagnosis not present

## 2021-06-18 DIAGNOSIS — I129 Hypertensive chronic kidney disease with stage 1 through stage 4 chronic kidney disease, or unspecified chronic kidney disease: Secondary | ICD-10-CM | POA: Diagnosis not present

## 2021-07-07 DIAGNOSIS — F5221 Male erectile disorder: Secondary | ICD-10-CM | POA: Diagnosis not present

## 2021-07-07 DIAGNOSIS — I10 Essential (primary) hypertension: Secondary | ICD-10-CM | POA: Diagnosis not present

## 2021-07-07 DIAGNOSIS — E781 Pure hyperglyceridemia: Secondary | ICD-10-CM | POA: Diagnosis not present

## 2021-07-07 DIAGNOSIS — I70213 Atherosclerosis of native arteries of extremities with intermittent claudication, bilateral legs: Secondary | ICD-10-CM | POA: Diagnosis not present

## 2021-07-07 DIAGNOSIS — E1169 Type 2 diabetes mellitus with other specified complication: Secondary | ICD-10-CM | POA: Diagnosis not present

## 2021-07-26 ENCOUNTER — Other Ambulatory Visit: Payer: Self-pay

## 2021-07-26 DIAGNOSIS — I739 Peripheral vascular disease, unspecified: Secondary | ICD-10-CM

## 2021-07-29 ENCOUNTER — Encounter: Payer: Self-pay | Admitting: Podiatry

## 2021-07-29 ENCOUNTER — Ambulatory Visit (INDEPENDENT_AMBULATORY_CARE_PROVIDER_SITE_OTHER): Payer: Medicare Other | Admitting: Podiatry

## 2021-07-29 ENCOUNTER — Other Ambulatory Visit: Payer: Self-pay

## 2021-07-29 DIAGNOSIS — B351 Tinea unguium: Secondary | ICD-10-CM

## 2021-07-29 DIAGNOSIS — M79676 Pain in unspecified toe(s): Secondary | ICD-10-CM

## 2021-08-02 NOTE — Progress Notes (Signed)
  Subjective:  Patient ID: Jorge Jefferson, male    DOB: July 27, 1946,  MRN: [id number]  Jorge Jefferson presents to clinic today for preventative diabetic foot care and thick, elongated toenails b/l feet which are tender when wearing enclosed shoe gear.  PCP is Iona Beard, MD , and last visit was 03/18/2021.  No Known Allergies  Review of Systems: Negative except as noted in the HPI. Objective:   Constitutional Jorge Jefferson is a pleasant 75 y.o. African American male, WD, WN in NAD. AAO x 3.   Vascular Neurovascular status intact b/l lower extremities. Capillary refill time to digits immediate b/l. Palpable DP pulse(s) b/l lower extremities Palpable PT pulse(s) b/l lower extremities Pedal hair present. Lower extremity skin temperature gradient within normal limits. No pain with calf compression b/l. No edema noted b/l lower extremities. No cyanosis or clubbing noted.  Neurologic Normal speech. Oriented to person, place, and time. Protective sensation intact 5/5 intact bilaterally with 10g monofilament b/l. Vibratory sensation intact b/l.  Dermatologic Skin warm and supple b/l lower extremities. No open wounds b/l LE. No interdigital macerations b/l lower extremities. Toenails 1-5 b/l elongated, discolored, dystrophic, thickened, crumbly with subungual debris and tenderness to dorsal palpation.  Orthopedic: Normal muscle strength 5/5 to all lower extremity muscle groups bilaterally. Hallux valgus with bunion deformity noted b/l lower extremities. Hammertoe(s) noted to the L 2nd toe and R 2nd toe. Limited joint ROM to the 1st MPJ of the right foot.   Radiographs: None Assessment:   1. Pain due to onychomycosis of toenail    Plan:  Patient was evaluated and treated and all questions answered. Consent given for treatment as described below: -Examined patient. -Continue diabetic foot care principles: inspect feet daily, monitor glucose as recommended by PCP and/or Endocrinologist, and  follow prescribed diet per PCP, Endocrinologist and/or dietician. -Patient to continue soft, supportive shoe gear daily. -Toenails 1-5 b/l were debrided in length and girth with sterile nail nippers and dremel without iatrogenic bleeding.  -Patient to report any pedal injuries to medical professional immediately. -Patient/POA to call should there be question/concern in the interim.  Return in about 3 months (around 10/29/2021).  Marzetta Board, DPM

## 2021-08-11 ENCOUNTER — Ambulatory Visit: Payer: Medicare Other

## 2021-08-11 ENCOUNTER — Encounter (HOSPITAL_COMMUNITY): Payer: Medicare Other

## 2021-08-12 DIAGNOSIS — I739 Peripheral vascular disease, unspecified: Secondary | ICD-10-CM | POA: Diagnosis not present

## 2021-08-12 DIAGNOSIS — Z720 Tobacco use: Secondary | ICD-10-CM | POA: Diagnosis not present

## 2021-08-12 DIAGNOSIS — F5221 Male erectile disorder: Secondary | ICD-10-CM | POA: Diagnosis not present

## 2021-08-12 DIAGNOSIS — I10 Essential (primary) hypertension: Secondary | ICD-10-CM | POA: Diagnosis not present

## 2021-08-12 DIAGNOSIS — E781 Pure hyperglyceridemia: Secondary | ICD-10-CM | POA: Diagnosis not present

## 2021-08-12 DIAGNOSIS — Z0001 Encounter for general adult medical examination with abnormal findings: Secondary | ICD-10-CM | POA: Diagnosis not present

## 2021-08-13 ENCOUNTER — Ambulatory Visit (HOSPITAL_COMMUNITY)
Admission: RE | Admit: 2021-08-13 | Discharge: 2021-08-13 | Disposition: A | Discharge status: Home / Self Care | Payer: Medicare Other | Source: Ambulatory Visit | Attending: Surgery | Admitting: Surgery

## 2021-08-13 ENCOUNTER — Other Ambulatory Visit: Payer: Self-pay

## 2021-08-13 ENCOUNTER — Ambulatory Visit (INDEPENDENT_AMBULATORY_CARE_PROVIDER_SITE_OTHER): Payer: Medicare Other | Admitting: Physician Assistant

## 2021-08-13 VITALS — BP 127/82 | HR 63 | Temp 97.2°F | Resp 16 | Ht >= 80 in | Wt 272.4 lb

## 2021-08-13 DIAGNOSIS — I739 Peripheral vascular disease, unspecified: Secondary | ICD-10-CM

## 2021-08-13 NOTE — Progress Notes (Signed)
Office Note     CC:  follow up Requesting Provider:  Iona Beard, MD  HPI: Jorge Jefferson is a 75 y.o. (05-Jan-1946) male who presents for follow up of PAD. He has a known left SFA occlusion.  Patient denies any claudication symptoms of his lower extremities.  He also denies any rest pain or nonhealing wounds of bilateral lower extremities.  He complains more of symmetrical hip pain when walking. This has been present for several years now. He describes this as a deep aching pain. Equal on both right and left. He says if he stops and rests this is improved. He denies any radiation of this pain to his back or down his legs.  He has a history of lumbar stenosis. The symptoms do not bother him enough to seek treatment however. He reports that he changed his statin medication and is now taking Tumeric and he feels that it is helping his hip pain. He is taking aspirin 325 and statin daily.   The pt is on a statin for cholesterol management.  The pt is on a daily aspirin.   Other AC:  none The pt is on CCB for hypertension.   The pt is diabetic.  Tobacco hx:  current, 1 ppd  Past Medical History:  Diagnosis Date   Hearing loss    Hyperlipidemia    Hypertension    Renal disorder     No past surgical history on file.  Social History   Socioeconomic History   Marital status: Married    Spouse name: Not on file   Number of children: Not on file   Years of education: Not on file   Highest education level: Not on file  Occupational History   Not on file  Tobacco Use   Smoking status: Every Day    Years: 30.00    Types: Cigarettes   Smokeless tobacco: Never   Tobacco comments:    Up to 1 pk  daily  Vaping Use   Vaping Use: Never used  Substance and Sexual Activity   Alcohol use: Yes    Comment: socially    Drug use: No   Sexual activity: Not on file  Other Topics Concern   Not on file  Social History Narrative   ** Merged History Encounter **       Social Determinants  of Health   Financial Resource Strain: Not on file  Food Insecurity: Not on file  Transportation Needs: Not on file  Physical Activity: Not on file  Stress: Not on file  Social Connections: Not on file  Intimate Partner Violence: Not on file   No family history on file.  Current Outpatient Medications  Medication Sig Dispense Refill   atorvastatin (LIPITOR) 10 MG tablet Take 10 mg by mouth daily.     amLODipine (NORVASC) 10 MG tablet Take 10 mg by mouth daily.  2   Blood Glucose Monitoring Suppl (ONE TOUCH ULTRA 2) w/Device KIT  (Patient not taking: Reported on 08/13/2021)     ciprofloxacin (CILOXAN) 0.3 % ophthalmic solution 4 DROPS IN EAR TWICE A DAY FOR 7 DAYS AS NEEDED FOR DRAINING (Patient not taking: Reported on 08/13/2021)     cloNIDine (CATAPRES) 0.1 MG tablet TAKE 1 TABLET BY MOUTH EVERY MORNING AND AT BEDTIME     Lancets (ONETOUCH DELICA PLUS [id number]) MISC  (Patient not taking: Reported on 08/13/2021)     metFORMIN (GLUCOPHAGE) 500 MG tablet Take 500 mg by mouth daily. (Patient not taking:  Reported on 08/13/2021)     Multiple Vitamins-Minerals (CENTRUM ADULTS) TABS Take 1 tablet by mouth daily.     nicotine (NICODERM CQ - DOSED IN MG/24 HOURS) 21 mg/24hr patch 21 mg daily.     ONETOUCH ULTRA test strip      prednisoLONE acetate (PRED FORTE) 1 % ophthalmic suspension SMARTSIG:4 Drop(s) Right Ear Twice Daily     simvastatin (ZOCOR) 10 MG tablet TAKE 1 TABLET BY MOUTH EVERYDAY AT BEDTIME (Patient not taking: Reported on 08/13/2021) 90 tablet 4   Sofosbuvir-Velpatasvir 400-100 MG TABS Take 1 tablet by mouth daily.     No current facility-administered medications for this visit.    No Known Allergies   REVIEW OF SYSTEMS:   '[X]'  denotes positive finding, '[ ]'  denotes negative finding Cardiac  Comments:  Chest pain or chest pressure:    Shortness of breath upon exertion:    Short of breath when lying flat:    Irregular heart rhythm:        Vascular    Pain in calf,  thigh, or hip brought on by ambulation:    Pain in feet at night that wakes you up from your sleep:     Blood clot in your veins:    Leg swelling:         Pulmonary    Oxygen at home:    Productive cough:     Wheezing:         Neurologic    Sudden weakness in arms or legs:     Sudden numbness in arms or legs:     Sudden onset of difficulty speaking or slurred speech:    Temporary loss of vision in one eye:     Problems with dizziness:         Gastrointestinal    Blood in stool:     Vomited blood:         Genitourinary    Burning when urinating:     Blood in urine:        Psychiatric    Major depression:         Hematologic    Bleeding problems:    Problems with blood clotting too easily:        Skin    Rashes or ulcers:        Constitutional    Fever or chills:      PHYSICAL EXAMINATION:  Vitals:   08/13/21 0934  BP: 127/82  Pulse: 63  Resp: 16  Temp: (!) 97.2 F (36.2 C)  TempSrc: Temporal  SpO2: 98%  Weight: 272 lb 6.4 oz (123.6 kg)  Height: '6\' 9"'  (2.057 m)    General:  WDWN in NAD; vital signs documented above Gait: Normal HENT: WNL, normocephalic Pulmonary: normal non-labored breathing , without wheezing Cardiac: regular HR, without  Murmurs without carotid bruit Abdomen: soft, NT, no masses Vascular Exam/Pulses:  Right Left  Radial 2+ (normal) 2+ (normal)  Femoral 2+ (normal) 2+ (normal)  Popliteal Not palpable Not palpable  DP absent absent  PT 2+ (normal) 1+ (weak)   Extremities: without ischemic changes, without Gangrene , without cellulitis; without open wounds;  Musculoskeletal: no muscle wasting or atrophy  Neurologic: A&O X 3;  No focal weakness or paresthesias are detected Psychiatric:  The pt has Normal affect.   Non-Invasive Vascular Imaging:   +-------+-----------+-----------+------------+------------+  ABI/TBIToday's ABIToday's TBIPrevious ABIPrevious TBI  +-------+-----------+-----------+------------+------------+   Right  0.86       0.72  1.00        0.74          +-------+-----------+-----------+------------+------------+  Left   0.55       0.46       0.64        0.48          +-------+-----------+-----------+------------+------------+    ASSESSMENT/PLAN:: 75 y.o. male here for follow up for PAD. He has known left SFA occlusion. He does not have clean claudication symptoms. He does not have rest pain or non healing wounds.  He does have hip pain but does not sound like hip claudication. He has palpable femoral pulses. ABIs today are slightly down but essentially unchanged from prior study. I have encouraged him to ambulate regularly. Encouraged smoking cessation. He will continue Aspirin and Statin. He will follow up earlier if he has new or worsening symptoms. He will follow up in 1 year with repeat ABIs   Karoline Caldwell, PA-C [facility] [phone]  Clinic MD:   [facility]

## 2021-08-18 DIAGNOSIS — E785 Hyperlipidemia, unspecified: Secondary | ICD-10-CM | POA: Diagnosis not present

## 2021-08-18 DIAGNOSIS — N182 Chronic kidney disease, stage 2 (mild): Secondary | ICD-10-CM | POA: Diagnosis not present

## 2021-08-18 DIAGNOSIS — I129 Hypertensive chronic kidney disease with stage 1 through stage 4 chronic kidney disease, or unspecified chronic kidney disease: Secondary | ICD-10-CM | POA: Diagnosis not present

## 2021-08-18 DIAGNOSIS — E1122 Type 2 diabetes mellitus with diabetic chronic kidney disease: Secondary | ICD-10-CM | POA: Diagnosis not present

## 2021-08-19 DIAGNOSIS — Q825 Congenital non-neoplastic nevus: Secondary | ICD-10-CM | POA: Diagnosis not present

## 2021-08-19 DIAGNOSIS — L918 Other hypertrophic disorders of the skin: Secondary | ICD-10-CM | POA: Diagnosis not present

## 2021-09-17 ENCOUNTER — Other Ambulatory Visit: Payer: Self-pay | Admitting: Nurse Practitioner

## 2021-09-17 ENCOUNTER — Other Ambulatory Visit (HOSPITAL_COMMUNITY): Payer: Self-pay | Admitting: Nurse Practitioner

## 2021-09-17 DIAGNOSIS — K760 Fatty (change of) liver, not elsewhere classified: Secondary | ICD-10-CM | POA: Diagnosis not present

## 2021-09-17 DIAGNOSIS — E1122 Type 2 diabetes mellitus with diabetic chronic kidney disease: Secondary | ICD-10-CM | POA: Diagnosis not present

## 2021-09-17 DIAGNOSIS — Z860100 Personal history of colon polyps, unspecified: Secondary | ICD-10-CM | POA: Insufficient documentation

## 2021-09-17 DIAGNOSIS — R7989 Other specified abnormal findings of blood chemistry: Secondary | ICD-10-CM | POA: Insufficient documentation

## 2021-09-17 DIAGNOSIS — E785 Hyperlipidemia, unspecified: Secondary | ICD-10-CM | POA: Insufficient documentation

## 2021-09-17 DIAGNOSIS — I129 Hypertensive chronic kidney disease with stage 1 through stage 4 chronic kidney disease, or unspecified chronic kidney disease: Secondary | ICD-10-CM | POA: Diagnosis not present

## 2021-09-17 DIAGNOSIS — K7402 Hepatic fibrosis, advanced fibrosis: Secondary | ICD-10-CM | POA: Diagnosis not present

## 2021-09-17 DIAGNOSIS — N182 Chronic kidney disease, stage 2 (mild): Secondary | ICD-10-CM | POA: Diagnosis not present

## 2021-09-19 DIAGNOSIS — K7402 Hepatic fibrosis, advanced fibrosis: Secondary | ICD-10-CM | POA: Diagnosis not present

## 2021-09-24 ENCOUNTER — Ambulatory Visit (HOSPITAL_COMMUNITY)
Admission: RE | Admit: 2021-09-24 | Discharge: 2021-09-24 | Disposition: A | Discharge status: Home / Self Care | Payer: Medicare Other | Source: Ambulatory Visit | Attending: Nurse Practitioner | Admitting: Nurse Practitioner

## 2021-09-24 ENCOUNTER — Other Ambulatory Visit: Payer: Self-pay

## 2021-09-24 DIAGNOSIS — K760 Fatty (change of) liver, not elsewhere classified: Secondary | ICD-10-CM | POA: Insufficient documentation

## 2021-09-24 DIAGNOSIS — K7402 Hepatic fibrosis, advanced fibrosis: Secondary | ICD-10-CM | POA: Insufficient documentation

## 2021-09-24 DIAGNOSIS — K7689 Other specified diseases of liver: Secondary | ICD-10-CM | POA: Diagnosis not present

## 2021-09-26 ENCOUNTER — Telehealth: Payer: Self-pay

## 2021-09-26 NOTE — Telephone Encounter (Signed)
Patient calls today to c/o sciatic hip pain. He denies any foot pain/wounds. He has had ongoing hip pain. Advised patient to contact PCP for further management. Instructed to call back if he developed pain in his feet/wounds.

## 2021-11-04 DIAGNOSIS — I10 Essential (primary) hypertension: Secondary | ICD-10-CM | POA: Diagnosis not present

## 2021-11-04 DIAGNOSIS — I70213 Atherosclerosis of native arteries of extremities with intermittent claudication, bilateral legs: Secondary | ICD-10-CM | POA: Diagnosis not present

## 2021-11-04 DIAGNOSIS — E781 Pure hyperglyceridemia: Secondary | ICD-10-CM | POA: Diagnosis not present

## 2021-11-04 DIAGNOSIS — E1169 Type 2 diabetes mellitus with other specified complication: Secondary | ICD-10-CM | POA: Diagnosis not present

## 2021-11-07 ENCOUNTER — Ambulatory Visit: Payer: Medicare Other | Admitting: Podiatry

## 2021-11-07 ENCOUNTER — Other Ambulatory Visit: Payer: Self-pay

## 2021-11-07 ENCOUNTER — Encounter: Payer: Self-pay | Admitting: Podiatry

## 2021-11-07 DIAGNOSIS — M2041 Other hammer toe(s) (acquired), right foot: Secondary | ICD-10-CM | POA: Diagnosis not present

## 2021-11-07 DIAGNOSIS — E1151 Type 2 diabetes mellitus with diabetic peripheral angiopathy without gangrene: Secondary | ICD-10-CM

## 2021-11-07 DIAGNOSIS — L600 Ingrowing nail: Secondary | ICD-10-CM

## 2021-11-07 DIAGNOSIS — E119 Type 2 diabetes mellitus without complications: Secondary | ICD-10-CM

## 2021-11-07 DIAGNOSIS — B351 Tinea unguium: Secondary | ICD-10-CM

## 2021-11-07 DIAGNOSIS — M79676 Pain in unspecified toe(s): Secondary | ICD-10-CM | POA: Diagnosis not present

## 2021-11-07 DIAGNOSIS — K5900 Constipation, unspecified: Secondary | ICD-10-CM | POA: Insufficient documentation

## 2021-11-07 DIAGNOSIS — M2042 Other hammer toe(s) (acquired), left foot: Secondary | ICD-10-CM

## 2021-11-07 DIAGNOSIS — Z1211 Encounter for screening for malignant neoplasm of colon: Secondary | ICD-10-CM | POA: Insufficient documentation

## 2021-11-07 NOTE — Patient Instructions (Addendum)
EPSOM SALT FOOT SOAK INSTRUCTIONS  Shopping List:  A. Plain epsom salt (not scented) B. Neosporin Cream/Ointment or Bacitracin Cream/Ointment   Place 1/4 cup of epsom salts in 2 quarts of warm tap water. IF YOU ARE DIABETIC, OR HAVE NEUROPATHY, CHECK THE TEMPERATURE OF THE WATER WITH YOUR ELBOW.   Submerge your foot/feet in the solution and soak for 10-15 minutes.      3.  Next, remove your foot/feet from solution, blot dry the affected area.    4.  Apply light amount of antibiotic cream/ointment and cover with fabric band-aid .  5.  This soak should be done once a day for 7 days.   6.  Monitor for any signs/symptoms of infection such as redness, swelling, odor, drainage, increased pain, or non-healing of digit.   7.  Please do not hesitate to call the office and speak to a Nurse or Doctor if you have questions.   8.  If you experience fever, chills, nightsweats, nausea or vomiting with worsening of digit/foot, please go to the emergency room.    Diabetes Mellitus and Foot Care Foot care is an important part of your health, especially when you have diabetes. Diabetes may cause you to have problems because of poor blood flow (circulation) to your feet and legs, which can cause your skin to: Become thinner and drier. Break more easily. Heal more slowly. Peel and crack. You may also have nerve damage (neuropathy) in your legs and feet, causing decreased feeling in them. This means that you may not notice minor injuries to your feet that could lead to more serious problems. Noticing and addressing any potential problems early is the best way to prevent future foot problems. How to care for your feet Foot hygiene  Wash your feet daily with warm water and mild soap. Do not use hot water. Then, pat your feet and the areas between your toes until they are completely dry. Do not soak your feet as this can dry your skin. Trim your toenails straight across. Do not dig under them or around the  cuticle. File the edges of your nails with an emery board or nail file. Apply a moisturizing lotion or petroleum jelly to the skin on your feet and to dry, brittle toenails. Use lotion that does not contain alcohol and is unscented. Do not apply lotion between your toes. Shoes and socks Wear clean socks or stockings every day. Make sure they are not too tight. Do not wear knee-high stockings since they may decrease blood flow to your legs. Wear shoes that fit properly and have enough cushioning. Always look in your shoes before you put them on to be sure there are no objects inside. To break in new shoes, wear them for just a few hours a day. This prevents injuries on your feet. Wounds, scrapes, corns, and calluses  Check your feet daily for blisters, cuts, bruises, sores, and redness. If you cannot see the bottom of your feet, use a mirror or ask someone for help. Do not cut corns or calluses or try to remove them with medicine. If you find a minor scrape, cut, or break in the skin on your feet, keep it and the skin around it clean and dry. You may clean these areas with mild soap and water. Do not clean the area with peroxide, alcohol, or iodine. If you have a wound, scrape, corn, or callus on your foot, look at it several times a day to make sure it is  healing and not infected. Check for: Redness, swelling, or pain. Fluid or blood. Warmth. Pus or a bad smell. General tips Do not cross your legs. This may decrease blood flow to your feet. Do not use heating pads or hot water bottles on your feet. They may burn your skin. If you have lost feeling in your feet or legs, you may not know this is happening until it is too late. Protect your feet from hot and cold by wearing shoes, such as at the beach or on hot pavement. Schedule a complete foot exam at least once a year (annually) or more often if you have foot problems. Report any cuts, sores, or bruises to your health care provider  immediately. Where to find more information American Diabetes Association: www.diabetes.org Association of Diabetes Care & Education Specialists: www.diabeteseducator.org Contact a health care provider if: You have a medical condition that increases your risk of infection and you have any cuts, sores, or bruises on your feet. You have an injury that is not healing. You have redness on your legs or feet. You feel burning or tingling in your legs or feet. You have pain or cramps in your legs and feet. Your legs or feet are numb. Your feet always feel cold. You have pain around any toenails. Get help right away if: You have a wound, scrape, corn, or callus on your foot and: You have pain, swelling, or redness that gets worse. You have fluid or blood coming from the wound, scrape, corn, or callus. Your wound, scrape, corn, or callus feels warm to the touch. You have pus or a bad smell coming from the wound, scrape, corn, or callus. You have a fever. You have a red line going up your leg. Summary Check your feet every day for blisters, cuts, bruises, sores, and redness. Apply a moisturizing lotion or petroleum jelly to the skin on your feet and to dry, brittle toenails. Wear shoes that fit properly and have enough cushioning. If you have foot problems, report any cuts, sores, or bruises to your health care provider immediately. Schedule a complete foot exam at least once a year (annually) or more often if you have foot problems. This information is not intended to replace advice given to you by your health care provider. Make sure you discuss any questions you have with your health care provider. Document Revised: 04/25/2020 Document Reviewed: 04/25/2020 Elsevier Patient Education  [place].

## 2021-11-07 NOTE — Progress Notes (Signed)
ANNUAL DIABETIC FOOT EXAM  Subjective: Jorge Jefferson presents today for for annual diabetic foot examination.  Patient denies dx of diabetes, but PCP, Dr. Iona Beard, records diabetes with CKD. Patient also has diagnosed PAD. He is seen by Vascular Surgery and last visit was with Karoline Caldwell, PA-C, on 08/13/2021  Patient denies any h/o foot wounds.  Patient denies any numbness, tingling, burning, or pins/needle sensation in feet.  Patient does not monitor blood glucose daily.  Risk factors:  current smoker on nicotine patches, diabetes, PAD, hyperlipidemia.  Iona Beard, MD is patient's PCP. Last visit was 058/31/2022.  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 No pertinent surgical history. Current Outpatient Medications on File Prior to Visit  Medication Sig Dispense Refill   amLODipine (NORVASC) 10 MG tablet Take 10 mg by mouth daily.  2   atorvastatin (LIPITOR) 10 MG tablet Take 10 mg by mouth daily.     Blood Glucose Monitoring Suppl (ONE TOUCH ULTRA 2) w/Device KIT  (Patient not taking: Reported on 08/13/2021)     ciprofloxacin (CILOXAN) 0.3 % ophthalmic solution 4 DROPS IN EAR TWICE A DAY FOR 7 DAYS AS NEEDED FOR DRAINING (Patient not taking: Reported on 08/13/2021)     cloNIDine (CATAPRES) 0.1 MG tablet TAKE 1 TABLET BY MOUTH EVERY MORNING AND AT BEDTIME     Lancets (ONETOUCH  DELICA PLUS [id number]) MISC  (Patient not taking: Reported on 08/13/2021)     metFORMIN (GLUCOPHAGE) 500 MG tablet Take 500 mg by mouth daily. (Patient not taking: Reported on 08/13/2021)     Multiple Vitamins-Minerals (CENTRUM ADULTS) TABS Take 1 tablet by mouth daily.     nicotine (NICODERM CQ - DOSED IN MG/24 HOURS) 21 mg/24hr patch 21 mg daily.     ONETOUCH ULTRA test strip      prednisoLONE acetate (PRED FORTE) 1 % ophthalmic suspension SMARTSIG:4 Drop(s) Right Ear Twice Daily     simvastatin (ZOCOR) 10 MG tablet TAKE 1 TABLET BY MOUTH EVERYDAY AT BEDTIME (Patient not taking: Reported on 08/13/2021) 90 tablet 4   Sofosbuvir-Velpatasvir 400-100 MG TABS Take 1 tablet by mouth daily.     No current facility-administered medications on file prior to visit.    No Known Allergies Social History   Occupational History   Not on file  Tobacco Use   Smoking status: Every Day    Years: 30.00    Types: Cigarettes   Smokeless tobacco: Never   Tobacco comments:    Up to 1 pk  daily  Vaping Use   Vaping Use: Never used  Substance and Sexual Activity   Alcohol use: Yes    Comment: socially    Drug use: No   Sexual activity: Not on file   History reviewed. No pertinent family history. Immunization History  Administered Date(s) Administered   Fluad Quad(high Dose 65+) 06/27/2019   Influenza, High Dose Seasonal PF 07/20/2018  Review of Systems: Negative except as noted in the HPI.   Objective: There were no vitals filed for this visit.  Jorge Jefferson is a pleasant 76 y.o. male in NAD. AAO X 3.  Vascular Examination: CFT <3 seconds b/l LE. Palpable PT pulse(s) right lower extremity Faintly palpable PT pulse(s) left lower extremity. Diminished DP pulse(s) b/l LE. Pedal hair absent. No pain with calf compression b/l. No edema noted b/l LE. No ischemia or gangrene noted b/l LE. No cyanosis or clubbing noted b/l LE.  Dermatological Examination: Pedal integument with normal  turgor, texture and tone BLE. No open wounds b/l LE. No interdigital macerations noted b/l LE. Toenails 2-5 bilaterally and L hallux elongated, discolored, dystrophic, thickened, and crumbly with subungual debris and tenderness to dorsal palpation. Incurvated nailplate medial border(s) R hallux.  Nail border hypertrophy minimal. There is tenderness to palpation. Sign(s) of infection: no clinical signs of infection noted on examination today.. No hyperkeratotic nor porokeratotic lesions present on today's visit.  Musculoskeletal Examination: Muscle strength 5/5 to all lower extremity muscle groups bilaterally. HAV with bunion deformity noted b/l LE. Hammertoe(s) noted to the bilateral 2nd toes.  Footwear Assessment: Does the patient wear appropriate shoes? Yes. Does the patient need inserts/orthotics? No.  Neurological Examination: Protective sensation intact 5/5 intact bilaterally with 10g monofilament b/l. Vibratory sensation intact b/l.  Non-Invasive Vascular Imaging:  on August 13, 2021 +-------+-----------+-----------+------------+------------+   ABI/TBI Today's ABI Today's TBI Previous ABI Previous TBI   +-------+-----------+-----------+------------+------------+   Right   0.86        0.72        1.00         0.74           +-------+-----------+-----------+------------+------------+   Left    0.55        0.46        0.64         0.48           +-------+-----------+-----------+------------+------------+     Assessment: 1. Pain due to onychomycosis of toenail   2. Ingrown toenail without infection   3. Acquired hammertoes of both feet   4. Type II diabetes mellitus with peripheral circulatory disorder (HCC)   5. Encounter for diabetic foot exam ([place])     ADA Risk Categorization: High Risk  Patient has one or more of the following: Loss of protective sensation Absent pedal pulses Severe Foot deformity History of foot ulcer  Plan: --Diabetic foot examination performed  today. -Discussion with patient to take heed to diabetes diagnosis. Encouraged him with his decision to quit smoking. Dispensed printed information on diabetes and foot care. He is under care of Vascular Surgery for PAD. No tissue loss/infection noted on today. -Continue foot and shoe inspections daily. Monitor blood glucose per PCP/Endocrinologist's recommendations. -Mycotic toenails 2-5 bilaterally and L hallux were debrided in length and girth with sterile nail nippers and dremel without iatrogenic bleeding. -No invasive procedure performed on today's visit. Offending nail border debrided and curretaged R hallux utilizing sterile nail nipper and currette. Border(s) cleansed with alcohol and triple antibiotic ointment/band-aid applied. Dispensed written instructions for once daily epsom salt soaks for 7 days. Call office if he has any problems. -Patient/POA to call should there be question/concern in the interim.  Return in about 3 months (around 02/05/2022).  Marzetta Board, DPM

## 2021-12-22 ENCOUNTER — Ambulatory Visit: Payer: Medicare Other | Admitting: Podiatry

## 2021-12-22 ENCOUNTER — Other Ambulatory Visit: Payer: Self-pay

## 2021-12-22 DIAGNOSIS — L84 Corns and callosities: Secondary | ICD-10-CM | POA: Diagnosis not present

## 2021-12-22 DIAGNOSIS — E1151 Type 2 diabetes mellitus with diabetic peripheral angiopathy without gangrene: Secondary | ICD-10-CM | POA: Diagnosis not present

## 2021-12-24 DIAGNOSIS — H7203 Central perforation of tympanic membrane, bilateral: Secondary | ICD-10-CM | POA: Diagnosis not present

## 2021-12-24 DIAGNOSIS — H903 Sensorineural hearing loss, bilateral: Secondary | ICD-10-CM | POA: Diagnosis not present

## 2021-12-28 NOTE — Progress Notes (Signed)
?  Subjective:  ?Patient ID: Jorge Jefferson, male    DOB: September 29, 1946,  MRN: [id number] ? ?Jorge Jefferson presents to clinic today for  cc of painful right great toe. Patient states pain is located at the tip of the digit. He notes constant throbbing pain and it is aggravated when wearning enclosed shoe gear. ? ?PCP is Iona Beard, MD , and last visit was September 17, 2021. ? ?No Known Allergies ? ?Review of Systems: Negative except as noted in the HPI. ? ?Objective: ?[name] is a pleasant 76 y.o. male in NAD. AAO X 3. ? ?Vascular Examination: ?CFT <3 seconds b/l LE. Palpable PT pulse(s) right lower extremity Faintly palpable PT pulse(s) left lower extremity. Diminished DP pulse(s) b/l LE. Pedal hair absent. No pain with calf compression b/l. No edema noted b/l LE. No ischemia or gangrene noted b/l LE. No cyanosis or clubbing noted b/l LE. ? ?Dermatological Examination: ?Pedal integument with normal turgor, texture and tone BLE. No open wounds b/l LE. No interdigital macerations noted b/l LE. Toenails 2-5 bilaterally and L hallux recently debrided. Subungual hyperkeratosis medial border right hallux with tenderness to palpation. Sign(s) of infection: no clinical signs of infection noted on examination today. ? ?Musculoskeletal Examination: ?Muscle strength 5/5 to all lower extremity muscle groups bilaterally. HAV with bunion deformity noted b/l LE. Hammertoe(s) noted to the bilateral 2nd toes. ? ?Neurological Examination: ?Protective sensation intact 5/5 intact bilaterally with 10g monofilament b/l. Vibratory sensation intact b/l. ? ?Assessment/Plan: ?1. Callus   ?2. Type II diabetes mellitus with peripheral circulatory disorder (HCC)   ?-Patient to continue soft, supportive shoe gear daily. ?-Callus(es) R hallux pared utilizing sterile scalpel blade without complication or incident. Total number debrided =1. ?-Patient/POA to call should there be question/concern in the interim.  ? ?Return in about 7  weeks (around 02/09/2022). ? ?Marzetta Board, DPM  ?

## 2021-12-29 DIAGNOSIS — I739 Peripheral vascular disease, unspecified: Secondary | ICD-10-CM | POA: Diagnosis not present

## 2022-01-18 ENCOUNTER — Other Ambulatory Visit: Payer: Self-pay | Admitting: Vascular Surgery

## 2022-02-09 ENCOUNTER — Ambulatory Visit: Payer: Medicare Other | Admitting: Podiatry

## 2022-02-09 ENCOUNTER — Encounter: Payer: Self-pay | Admitting: Podiatry

## 2022-02-09 DIAGNOSIS — B351 Tinea unguium: Secondary | ICD-10-CM | POA: Diagnosis not present

## 2022-02-09 DIAGNOSIS — M79675 Pain in left toe(s): Secondary | ICD-10-CM

## 2022-02-09 DIAGNOSIS — N182 Chronic kidney disease, stage 2 (mild): Secondary | ICD-10-CM | POA: Diagnosis not present

## 2022-02-09 DIAGNOSIS — I739 Peripheral vascular disease, unspecified: Secondary | ICD-10-CM | POA: Diagnosis not present

## 2022-02-09 DIAGNOSIS — M79674 Pain in right toe(s): Secondary | ICD-10-CM

## 2022-02-09 DIAGNOSIS — M79676 Pain in unspecified toe(s): Secondary | ICD-10-CM

## 2022-02-09 DIAGNOSIS — E0822 Diabetes mellitus due to underlying condition with diabetic chronic kidney disease: Secondary | ICD-10-CM | POA: Diagnosis not present

## 2022-02-09 DIAGNOSIS — L84 Corns and callosities: Secondary | ICD-10-CM

## 2022-02-09 NOTE — Progress Notes (Signed)
?  Subjective:  ?Patient ID: Jorge Jefferson, male    DOB: 11-19-1945,  MRN: [id number] ? ?Jorge Jefferson presents to clinic today for at risk foot care. Pt has h/o NIDDM with chronic kidney disease and painful elongated mycotic toenails 1-5 bilaterally which are tender when wearing enclosed shoe gear. Pain is relieved with periodic professional debridement. ? ?He is known to have nail groove callus medial border of right great toe. ? ?New problem(s): None.  ? ?PCP is Iona Beard, MD , and last visit was September 17, 2021. ? ?No Known Allergies ? ?Review of Systems: Negative except as noted in the HPI. ? ?Objective:  ?Jorge Jefferson is a pleasant 76 y.o. male in NAD. AAO X 3. ? ?Vascular Examination: ?CFT <3 seconds b/l LE. Palpable PT pulse(s) right lower extremity. Faintly palpable PT pulse(s) left lower extremity. Diminished DP pulse(s) b/l LE. Pedal hair absent. No pain with calf compression b/l. No edema noted b/l LE. No ischemia or gangrene noted b/l LE. No cyanosis or clubbing noted b/l LE. ? ?Dermatological Examination: ?Pedal integument with normal turgor, texture and tone BLE. No open wounds b/l LE. No interdigital macerations noted b/l LE. Toenails 2-5 bilaterally and L hallux elongated, discolored, dystrophic, thickened, and crumbly with subungual debris and tenderness to dorsal palpation. Hyperkeratotic lesion(s) medial border right hallux.  No erythema, no edema, no drainage, no fluctuance.  ? ?Musculoskeletal Examination: ?Muscle strength 5/5 to all lower extremity muscle groups bilaterally. HAV with bunion deformity noted b/l LE. Hammertoe(s) noted to the bilateral 2nd toes. ? ?Neurological Examination: ?Protective sensation intact 5/5 intact bilaterally with 10g monofilament b/l. Vibratory sensation intact b/l. ? ?Assessment/Plan: ?1. Pain due to onychomycosis of toenail   ?2. Callus   ?3. PAD (peripheral artery disease) ([place])   ?4. Diabetes mellitus due to underlying condition with stage 2  chronic kidney disease, without long-term current use of insulin ([place])   ?  ?-Patient was evaluated and treated. All patient's and/or POA's questions/concerns answered on today's visit. ?-Patient to continue soft, supportive shoe gear daily. ?-Mycotic toenails 1-5 bilaterally were debrided in length and girth with sterile nail nippers and dremel without incident. ?-Callus(es) medial border right hallux pared utilizing sterile scalpel blade without complication or incident. Total number debrided =1. ?-Patient/POA to call should there be question/concern in the interim.  ? ?Return in about 3 months (around 05/11/2022). ? ?Marzetta Board, DPM  ?

## 2022-03-03 DIAGNOSIS — E1169 Type 2 diabetes mellitus with other specified complication: Secondary | ICD-10-CM | POA: Diagnosis not present

## 2022-03-03 DIAGNOSIS — Z720 Tobacco use: Secondary | ICD-10-CM | POA: Diagnosis not present

## 2022-03-03 DIAGNOSIS — I739 Peripheral vascular disease, unspecified: Secondary | ICD-10-CM | POA: Diagnosis not present

## 2022-03-03 DIAGNOSIS — I10 Essential (primary) hypertension: Secondary | ICD-10-CM | POA: Diagnosis not present

## 2022-03-03 DIAGNOSIS — E781 Pure hyperglyceridemia: Secondary | ICD-10-CM | POA: Diagnosis not present

## 2022-03-18 ENCOUNTER — Other Ambulatory Visit: Payer: Self-pay | Admitting: Nurse Practitioner

## 2022-03-18 DIAGNOSIS — K7402 Hepatic fibrosis, advanced fibrosis: Secondary | ICD-10-CM

## 2022-03-18 DIAGNOSIS — K760 Fatty (change of) liver, not elsewhere classified: Secondary | ICD-10-CM | POA: Diagnosis not present

## 2022-03-25 ENCOUNTER — Ambulatory Visit
Admission: RE | Admit: 2022-03-25 | Discharge: 2022-03-25 | Disposition: A | Discharge status: Home / Self Care | Payer: Medicare Other | Source: Ambulatory Visit | Attending: Nurse Practitioner | Admitting: Nurse Practitioner

## 2022-03-25 DIAGNOSIS — K7402 Hepatic fibrosis, advanced fibrosis: Secondary | ICD-10-CM

## 2022-03-25 DIAGNOSIS — K760 Fatty (change of) liver, not elsewhere classified: Secondary | ICD-10-CM | POA: Diagnosis not present

## 2022-04-29 ENCOUNTER — Telehealth: Payer: Self-pay

## 2022-04-29 NOTE — Telephone Encounter (Signed)
Pt called stating that he was having sciatic nerve pain.  Reviewed pt's chart, returned pt's call for clarification, two identifiers used. Pt has had this chronic issue for a while now. He denies any foot/leg pain, swelling, discoloration, or wounds. Pt was referred back to his PCP for assistance. Confirmed understanding.

## 2022-05-13 ENCOUNTER — Encounter: Payer: Self-pay | Admitting: Podiatry

## 2022-05-13 ENCOUNTER — Ambulatory Visit: Payer: Medicare Other | Admitting: Podiatry

## 2022-05-13 DIAGNOSIS — L84 Corns and callosities: Secondary | ICD-10-CM | POA: Diagnosis not present

## 2022-05-13 DIAGNOSIS — E1151 Type 2 diabetes mellitus with diabetic peripheral angiopathy without gangrene: Secondary | ICD-10-CM

## 2022-05-13 DIAGNOSIS — B351 Tinea unguium: Secondary | ICD-10-CM

## 2022-05-13 DIAGNOSIS — M79676 Pain in unspecified toe(s): Secondary | ICD-10-CM

## 2022-05-17 NOTE — Progress Notes (Signed)
  Subjective:  Patient ID: Jorge Jefferson, male    DOB: 1946/06/29,  MRN: [id number]  CHER EGNOR presents to clinic today for at risk foot care. Pt has h/o NIDDM with chronic kidney disease and callus(es) medial border right hallux and painful thick toenails that are difficult to trim. Painful toenails interfere with ambulation. Aggravating factors include wearing enclosed shoe gear. Pain is relieved with periodic professional debridement. Painful calluses are aggravated when weightbearing with and without shoegear. Pain is relieved with periodic professional debridement.  Patient does not monitor blood glucose daily.  Patient is not required to monitor blood glucose daily.  New problem(s): None.   PCP is Iona Beard, MD , and last visit was  December 29, 2021  No Known Allergies  Review of Systems: Negative except as noted in the HPI.  Objective: No changes noted in today's physical examination.  Jorge Jefferson is a pleasant 76 y.o. male in NAD. AAO X 3.  Vascular Examination: CFT <3 seconds b/l LE. Palpable PT pulse(s) right lower extremity. Faintly palpable PT pulse(s) left lower extremity. Diminished DP pulse(s) b/l LE. Pedal hair absent. No pain with calf compression b/l. No edema noted b/l LE. No ischemia or gangrene noted b/l LE. No cyanosis or clubbing noted b/l LE.  Dermatological Examination: Pedal integument with normal turgor, texture and tone BLE. No open wounds b/l LE. No interdigital macerations noted b/l LE. Toenails 2-5 bilaterally and L hallux elongated, discolored, dystrophic, thickened, and crumbly with subungual debris and tenderness to dorsal palpation. Hyperkeratotic lesion(s) medial border right hallux.  No erythema, no edema, no drainage, no fluctuance.   Musculoskeletal Examination: Muscle strength 5/5 to all lower extremity muscle groups bilaterally. HAV with bunion deformity noted b/l LE. Hammertoe(s) noted to the bilateral 2nd toes.  Neurological  Examination: Protective sensation intact 5/5 intact bilaterally with 10g monofilament b/l. Vibratory sensation intact b/l.  Assessment/Plan: 1. Pain due to onychomycosis of toenail   2. Callus   3. Type II diabetes mellitus with peripheral circulatory disorder (HCC)      -Examined patient. -No new findings. No new orders. -Mycotic toenails 1-5 bilaterally were debrided in length and girth with sterile nail nippers and dremel without incident. -Callus(es) medial border right hallux pared utilizing sterile scalpel blade without complication or incident. Total number debrided =1. -Patient/POA to call should there be question/concern in the interim.   Return in about 3 months (around 08/13/2022).  Marzetta Board, DPM

## 2022-07-06 DIAGNOSIS — I10 Essential (primary) hypertension: Secondary | ICD-10-CM | POA: Diagnosis not present

## 2022-07-06 DIAGNOSIS — I739 Peripheral vascular disease, unspecified: Secondary | ICD-10-CM | POA: Diagnosis not present

## 2022-07-06 DIAGNOSIS — Z720 Tobacco use: Secondary | ICD-10-CM | POA: Diagnosis not present

## 2022-07-06 DIAGNOSIS — E1169 Type 2 diabetes mellitus with other specified complication: Secondary | ICD-10-CM | POA: Diagnosis not present

## 2022-07-06 DIAGNOSIS — E781 Pure hyperglyceridemia: Secondary | ICD-10-CM | POA: Diagnosis not present

## 2022-07-07 DIAGNOSIS — E1169 Type 2 diabetes mellitus with other specified complication: Secondary | ICD-10-CM | POA: Diagnosis not present

## 2022-07-18 DIAGNOSIS — I129 Hypertensive chronic kidney disease with stage 1 through stage 4 chronic kidney disease, or unspecified chronic kidney disease: Secondary | ICD-10-CM | POA: Diagnosis not present

## 2022-07-18 DIAGNOSIS — N182 Chronic kidney disease, stage 2 (mild): Secondary | ICD-10-CM | POA: Diagnosis not present

## 2022-07-18 DIAGNOSIS — E1122 Type 2 diabetes mellitus with diabetic chronic kidney disease: Secondary | ICD-10-CM | POA: Diagnosis not present

## 2022-08-13 ENCOUNTER — Other Ambulatory Visit (HOSPITAL_COMMUNITY): Payer: Self-pay | Admitting: Family Medicine

## 2022-08-13 DIAGNOSIS — M25552 Pain in left hip: Secondary | ICD-10-CM | POA: Diagnosis not present

## 2022-08-13 DIAGNOSIS — M25551 Pain in right hip: Secondary | ICD-10-CM | POA: Diagnosis not present

## 2022-08-13 DIAGNOSIS — M545 Low back pain, unspecified: Secondary | ICD-10-CM | POA: Diagnosis not present

## 2022-08-14 ENCOUNTER — Other Ambulatory Visit (HOSPITAL_COMMUNITY): Payer: Self-pay | Admitting: Family Medicine

## 2022-08-14 ENCOUNTER — Ambulatory Visit (HOSPITAL_COMMUNITY)
Admission: RE | Admit: 2022-08-14 | Discharge: 2022-08-14 | Disposition: A | Discharge status: Home / Self Care | Payer: Medicare Other | Source: Ambulatory Visit | Attending: Family Medicine | Admitting: Family Medicine

## 2022-08-14 DIAGNOSIS — M25552 Pain in left hip: Secondary | ICD-10-CM | POA: Insufficient documentation

## 2022-08-14 DIAGNOSIS — M545 Low back pain, unspecified: Secondary | ICD-10-CM

## 2022-08-14 DIAGNOSIS — M25551 Pain in right hip: Secondary | ICD-10-CM

## 2022-08-26 ENCOUNTER — Encounter: Payer: Self-pay | Admitting: Podiatry

## 2022-08-26 ENCOUNTER — Ambulatory Visit: Payer: Medicare Other | Admitting: Podiatry

## 2022-08-26 DIAGNOSIS — M79676 Pain in unspecified toe(s): Secondary | ICD-10-CM

## 2022-08-26 DIAGNOSIS — E1151 Type 2 diabetes mellitus with diabetic peripheral angiopathy without gangrene: Secondary | ICD-10-CM

## 2022-08-26 DIAGNOSIS — B351 Tinea unguium: Secondary | ICD-10-CM

## 2022-08-26 DIAGNOSIS — L84 Corns and callosities: Secondary | ICD-10-CM

## 2022-08-26 NOTE — Progress Notes (Signed)
  Subjective:  Patient ID: Jorge Jefferson, male    DOB: Apr 23, 1946,  MRN: [id number]  Jorge Jefferson presents to clinic today for at risk foot care. Pt has h/o NIDDM with PAD and painful elongated mycotic toenails 1-5 bilaterally which are tender when wearing enclosed shoe gear. Pain is relieved with periodic professional debridement.  Chief Complaint  Patient presents with   Nail Problem    Nail Trim Not Diabetic  PCP - Dr Berdine Addison    New problem(s): None.   PCP is Iona Beard, MD , and last visit was Mar 03, 2022.  No Known Allergies  Review of Systems: Negative except as noted in the HPI.  Objective: No changes noted in today's physical examination.  [name] is a pleasant 76 y.o. male in NAD. AAO x 3.  Vascular Examination: CFT <3 seconds b/l LE. Palpable PT pulse(s) right lower extremity. Faintly palpable PT pulse(s) left lower extremity. Diminished DP pulse(s) b/l LE. Pedal hair absent. No pain with calf compression b/l. No edema noted b/l LE. No ischemia or gangrene noted b/l LE. No cyanosis or clubbing noted b/l LE.  Dermatological Examination: Pedal integument with normal turgor, texture and tone BLE. No open wounds b/l LE. No interdigital macerations noted b/l LE. Toenails 2-5 bilaterally and L hallux elongated, discolored, dystrophic, thickened, and crumbly with subungual debris and tenderness to dorsal palpation. Hyperkeratotic lesion(s) medial border right hallux.  No erythema, no edema, no drainage, no fluctuance.   Musculoskeletal Examination: Muscle strength 5/5 to all lower extremity muscle groups bilaterally. HAV with bunion deformity noted b/l LE. Hammertoe(s) noted to the bilateral 2nd toes.  Neurological Examination: Protective sensation intact 5/5 intact bilaterally with 10g monofilament b/l. Vibratory sensation intact b/l.  Assessment/Plan: 1. Pain due to onychomycosis of toenail   2. Callus   3. Type II diabetes mellitus with peripheral  circulatory disorder (HCC)     No orders of the defined types were placed in this encounter.   -Patient was evaluated and treated. All patient's and/or POA's questions/concerns answered on today's visit. -Continue foot and shoe inspections daily. Monitor blood glucose per PCP/Endocrinologist's recommendations. -Toenails 1-5 b/l were debrided in length and girth with sterile nail nippers and dremel without iatrogenic bleeding.  -Callus(es) R hallux pared utilizing sterile scalpel blade without complication or incident. Total number debrided =1. -Patient/POA to call should there be question/concern in the interim.   Return in about 3 months (around 11/26/2022).  Marzetta Board, DPM

## 2022-09-17 ENCOUNTER — Other Ambulatory Visit: Payer: Self-pay | Admitting: Nurse Practitioner

## 2022-09-17 DIAGNOSIS — K7402 Hepatic fibrosis, advanced fibrosis: Secondary | ICD-10-CM | POA: Diagnosis not present

## 2022-09-17 DIAGNOSIS — K760 Fatty (change of) liver, not elsewhere classified: Secondary | ICD-10-CM | POA: Diagnosis not present

## 2022-09-30 ENCOUNTER — Ambulatory Visit
Admission: RE | Admit: 2022-09-30 | Discharge: 2022-09-30 | Disposition: A | Discharge status: Home / Self Care | Payer: Medicare Other | Source: Ambulatory Visit | Attending: Nurse Practitioner | Admitting: Nurse Practitioner

## 2022-09-30 DIAGNOSIS — K7402 Hepatic fibrosis, advanced fibrosis: Secondary | ICD-10-CM

## 2022-09-30 DIAGNOSIS — K760 Fatty (change of) liver, not elsewhere classified: Secondary | ICD-10-CM

## 2022-11-09 DIAGNOSIS — M25551 Pain in right hip: Secondary | ICD-10-CM | POA: Diagnosis not present

## 2022-11-09 DIAGNOSIS — E1122 Type 2 diabetes mellitus with diabetic chronic kidney disease: Secondary | ICD-10-CM | POA: Diagnosis not present

## 2022-11-09 DIAGNOSIS — E785 Hyperlipidemia, unspecified: Secondary | ICD-10-CM | POA: Diagnosis not present

## 2022-11-09 DIAGNOSIS — N183 Chronic kidney disease, stage 3 unspecified: Secondary | ICD-10-CM | POA: Diagnosis not present

## 2022-11-09 DIAGNOSIS — M25552 Pain in left hip: Secondary | ICD-10-CM | POA: Diagnosis not present

## 2022-11-09 DIAGNOSIS — I129 Hypertensive chronic kidney disease with stage 1 through stage 4 chronic kidney disease, or unspecified chronic kidney disease: Secondary | ICD-10-CM | POA: Diagnosis not present

## 2022-11-09 DIAGNOSIS — N1831 Chronic kidney disease, stage 3a: Secondary | ICD-10-CM | POA: Diagnosis not present

## 2022-11-09 DIAGNOSIS — I10 Essential (primary) hypertension: Secondary | ICD-10-CM | POA: Diagnosis not present

## 2022-12-01 ENCOUNTER — Other Ambulatory Visit: Payer: Self-pay

## 2022-12-01 DIAGNOSIS — I739 Peripheral vascular disease, unspecified: Secondary | ICD-10-CM

## 2022-12-02 ENCOUNTER — Ambulatory Visit: Payer: Medicare Other | Admitting: Vascular Surgery

## 2022-12-02 ENCOUNTER — Ambulatory Visit (INDEPENDENT_AMBULATORY_CARE_PROVIDER_SITE_OTHER): Payer: Medicare Other

## 2022-12-02 ENCOUNTER — Encounter: Payer: Self-pay | Admitting: Vascular Surgery

## 2022-12-02 VITALS — BP 156/81 | HR 68 | Temp 99.0°F | Ht >= 80 in | Wt 264.8 lb

## 2022-12-02 DIAGNOSIS — I739 Peripheral vascular disease, unspecified: Secondary | ICD-10-CM

## 2022-12-02 NOTE — Progress Notes (Signed)
[facility]  Patient name: Jorge Jefferson MRN: [id number] DOB: 1946/02/10 Sex: male  REASON FOR VISIT: Follow-up lower extremity arterial insufficiency  HPI: Jorge Jefferson is a 77 y.o. male here today for follow-up.  He has known history of superficial femoral artery occlusive disease.  His main complaint is of limiting claudication in both hips.  He remains quite active and is significantly limited by this.  He does report some left calf claudication but this is less limiting to him than his hip discomfort.  This is reliably brought on by walking and is relieved with rest.  He has no history of tissue loss.  He does report a sensation of swelling in the arch of his left foot which appears to be more related to neuropathy.  He has had imaging of his hips which have been reported as normal and also reported as mild bilateral arthritis.  He does have a history of back injury.  He has not had back surgery.  Past Medical History:  Diagnosis Date   Hearing loss    Hyperlipidemia    Hypertension    Renal disorder     History reviewed. No pertinent family history.  SOCIAL HISTORY: Social History   Tobacco Use   Smoking status: Every Day    Packs/day: 0.25    Years: 30.00    Total pack years: 7.50    Types: Cigarettes   Smokeless tobacco: Never   Tobacco comments:    Up to 1 pk  daily  Substance Use Topics   Alcohol use: Yes    Comment: socially     No Known Allergies  Current Outpatient Medications  Medication Sig Dispense Refill   amLODipine (NORVASC) 10 MG tablet Take 10 mg by mouth daily.  2   atorvastatin (LIPITOR) 10 MG tablet Take by mouth.     Blood Glucose Monitoring Suppl (ONE TOUCH ULTRA 2) w/Device KIT      cholecalciferol (VITAMIN D) 25 MCG (1000 UNIT) tablet Take by mouth.     ciprofloxacin (CILOXAN) 0.3 % ophthalmic solution      cloNIDine (CATAPRES) 0.1 MG tablet Take by mouth.      Lancets (ONETOUCH DELICA PLUS 123XX123) MISC      metFORMIN (GLUCOPHAGE) 500 MG tablet Take 500 mg by mouth daily.     Multiple Vitamins-Minerals (CENTRUM ADULTS) TABS Take 1 tablet by mouth daily.     nicotine (NICODERM CQ - DOSED IN MG/24 HOURS) 21 mg/24hr patch 21 mg daily.     Omega-3 Fatty Acids (OMEGA III EPA+DHA) 1000 MG CAPS Fish oil     ONETOUCH ULTRA test strip      prednisoLONE acetate (PRED FORTE) 1 % ophthalmic suspension SMARTSIG:4 Drop(s) Right Ear Twice Daily     Sofosbuvir-Velpatasvir 400-100 MG TABS Take 1 tablet by mouth daily.     No current facility-administered medications for this visit.    REVIEW OF SYSTEMS:  [X]$  denotes positive finding, [ ]$  denotes negative finding Cardiac  Comments:  Chest pain or chest pressure:    Shortness of breath upon exertion:    Short of breath when lying flat:    Irregular heart rhythm:        Vascular    Pain in calf, thigh, or hip brought on by ambulation: x   Pain in feet at night that wakes you up from your sleep:     Blood clot in your veins:    Leg swelling:  PHYSICAL EXAM: Vitals:   12/02/22 1018  BP: (!) 156/81  Pulse: 68  Temp: 99 F (37.2 C)  SpO2: 95%  Weight: 264 lb 12.8 oz (120.1 kg)  Height: 6' 9"$  (2.057 m)    GENERAL: The patient is a well-nourished male, in no acute distress. The vital signs are documented above. CARDIOVASCULAR: Plus radial pulses bilaterally.  He appears to have diminished femoral pulses.  He has absent popliteal and distal pulses PULMONARY: There is good air exchange  MUSCULOSKELETAL: There are no major deformities or cyanosis. NEUROLOGIC: No focal weakness or paresthesias are detected. SKIN: There are no ulcers or rashes noted. PSYCHIATRIC: The patient has a normal affect.  DATA:  Noninvasive studies today were reviewed with the patient.  This reveals ankle arm index on the right of 0.64 with monophasic signal.  On the left 0.49 with dampened monophasic signal.  This is  worse bilaterally than his last study in October 2022  MEDICAL ISSUES: Patient is very uncomfortable and is unable to do his urinary walking routine.  Dr. 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  This is his most limiting symptom.  I have recommended CT angiogram of aorta pelvis and runoff for further evaluation.  His most recent creatinine in the epic system was from 2018 with a creatinine of 1.26.  We will confirm no worsening renal function and proceed with CT angiogram.  I will see him for discussion of this following the study    Rosetta Posner, MD FACS [facility] [phone]  Note: Portions of this report may have been transcribed using voice recognition software.  Every effort has been made to ensure accuracy; however, inadvertent computerized transcription errors may still be present.

## 2022-12-03 LAB — VAS US ABI WITH/WO TBI
Left ABI: 0.49
Right ABI: 0.64

## 2022-12-03 IMAGING — US US ABDOMEN LIMITED
1 series · 14 of 25 positions shown · non-contrast
Comparison: 02/06/2021

CLINICAL DATA: Advanced hepatic fibrosis, steatosis

EXAM:
ULTRASOUND ABDOMEN LIMITED RIGHT UPPER QUADRANT

[Series 1: us abdomen limited · 0.18mm/px · 14 of 66 slices shown]
[im 1/66]
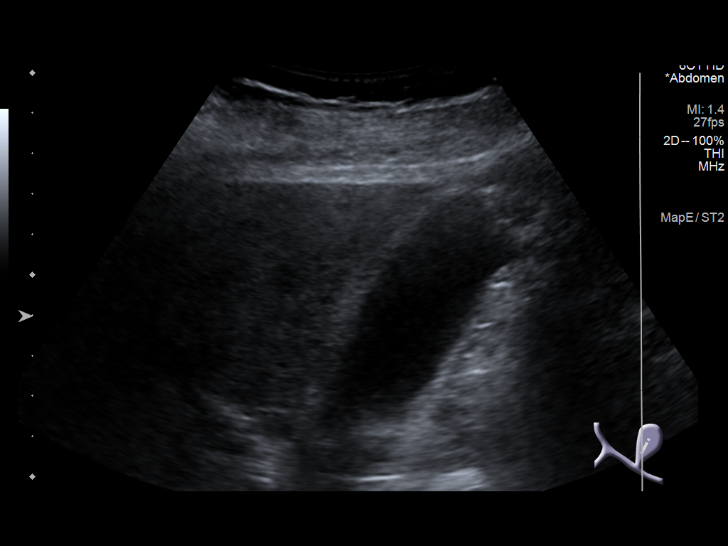
[im 6/66]
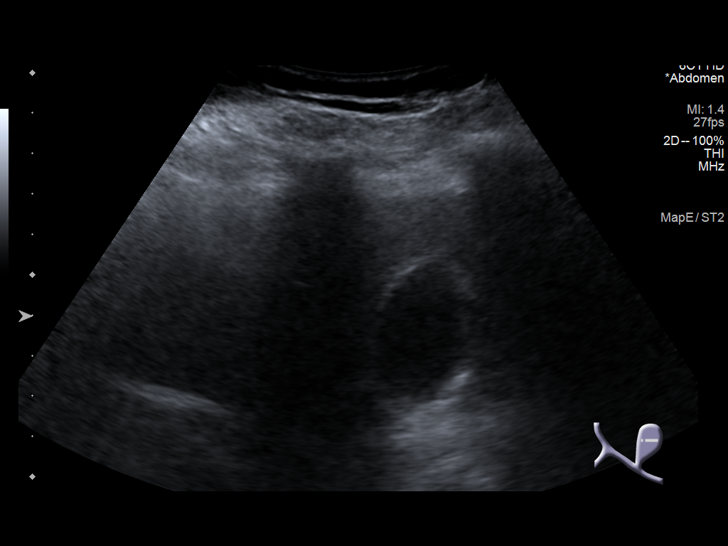
[im 11/66]
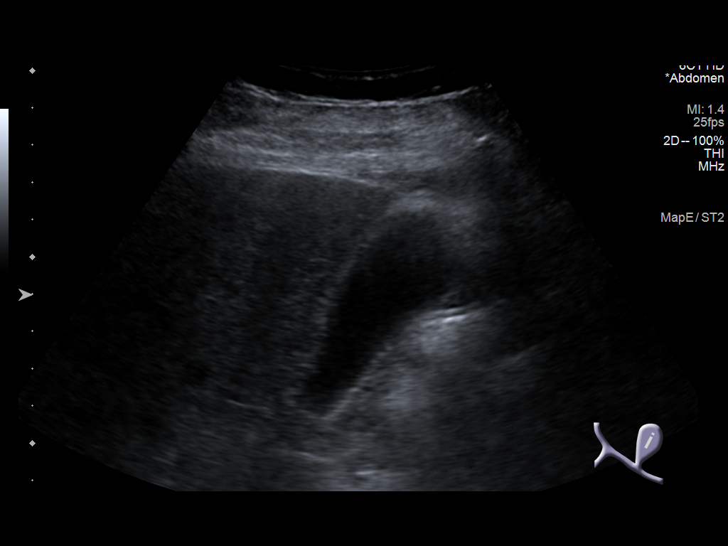
[im 17/66]
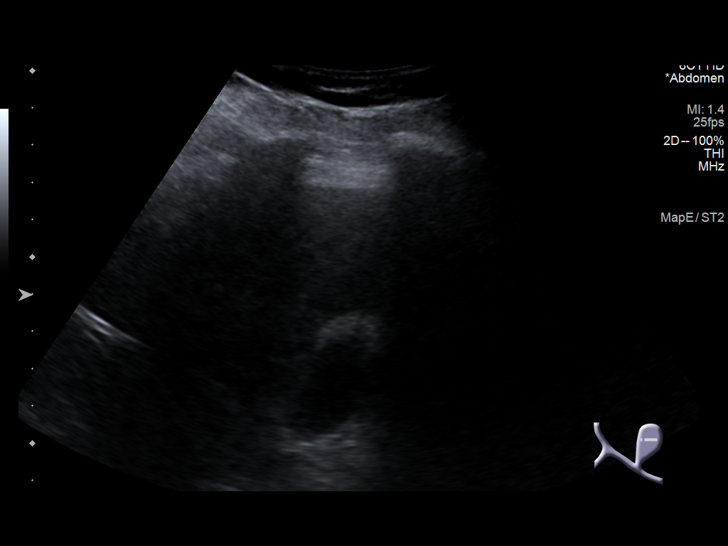
[im 22/66]
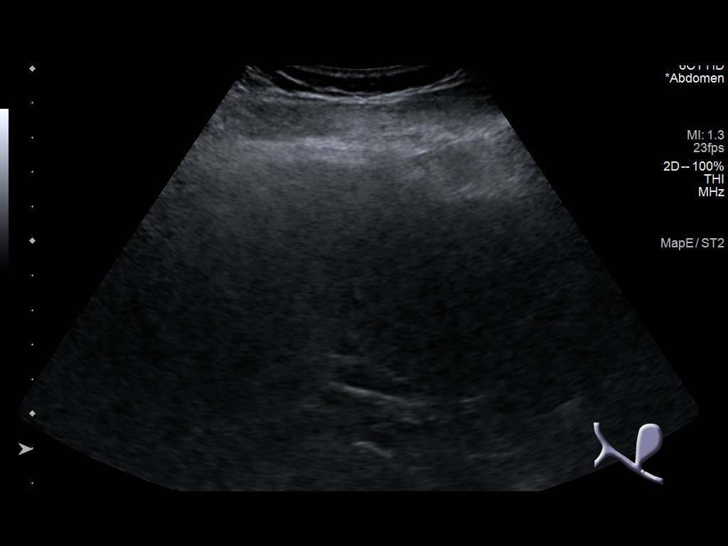
[im 25/66]
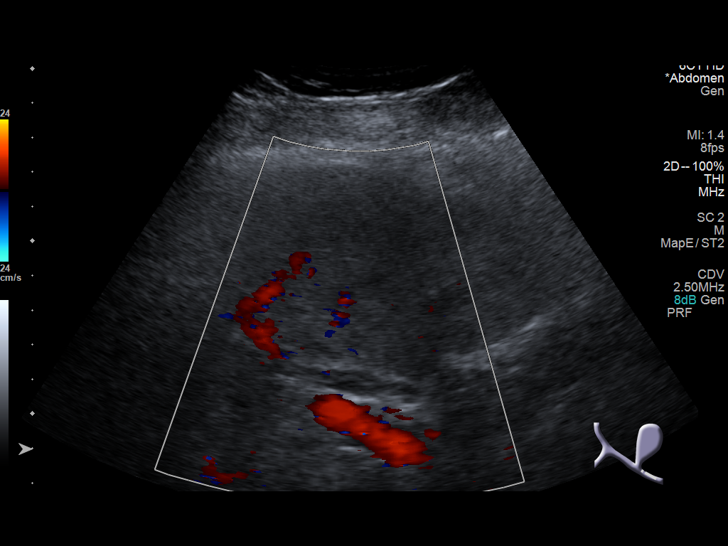
[im 30/66]
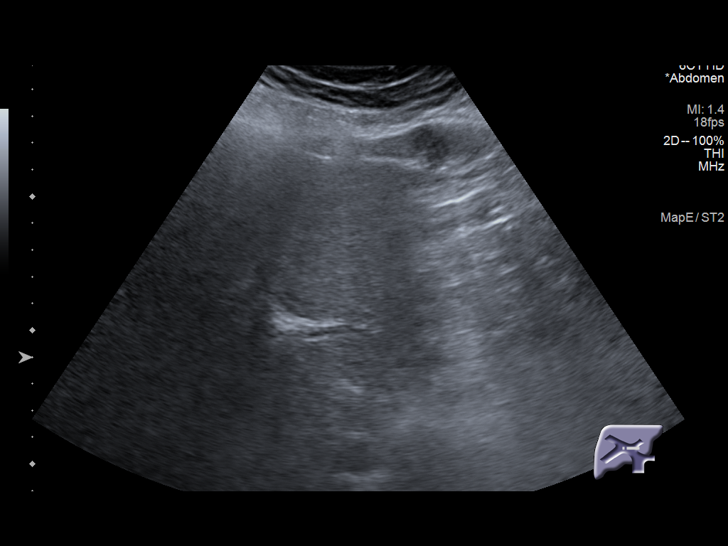
[im 36/66]
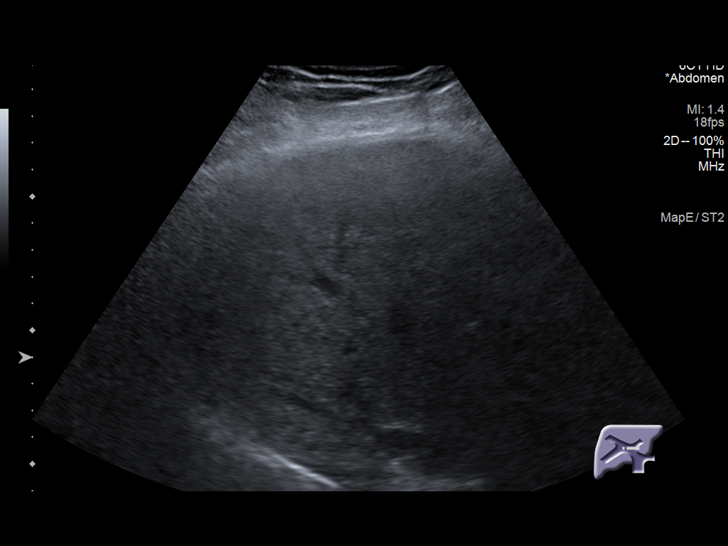
[im 41/66]
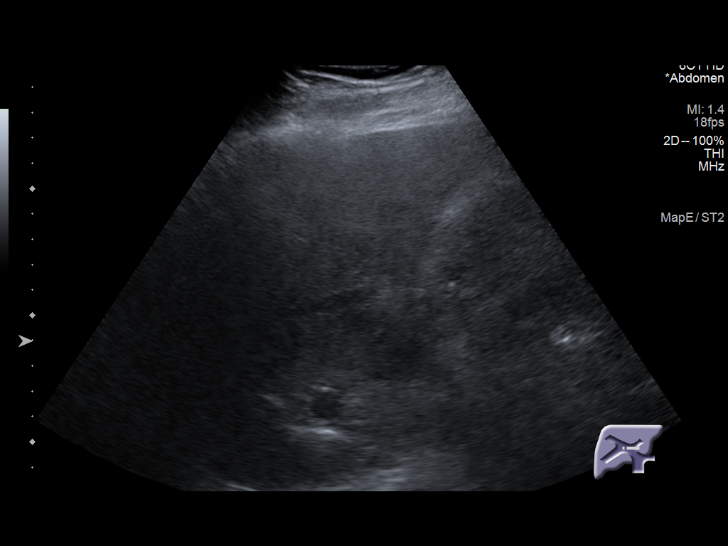
[im 44/66]
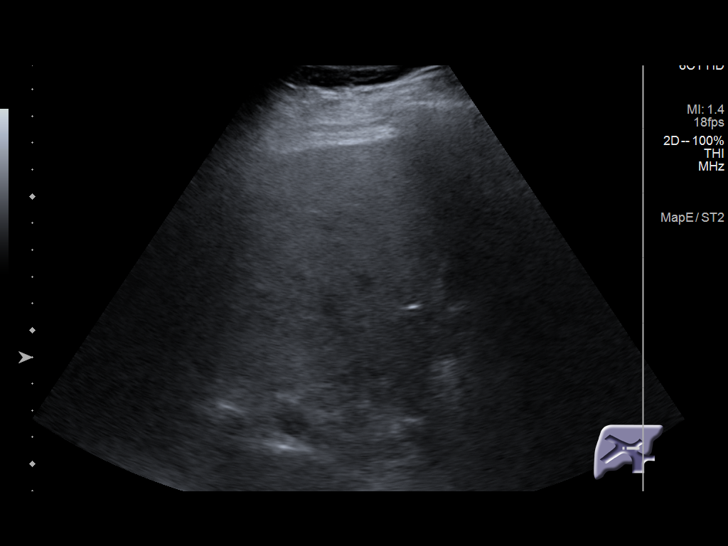
[im 49/66]
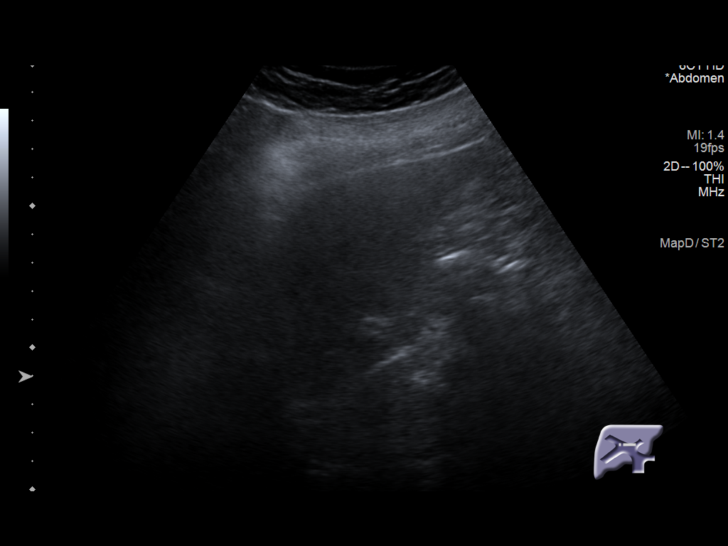
[im 55/66]
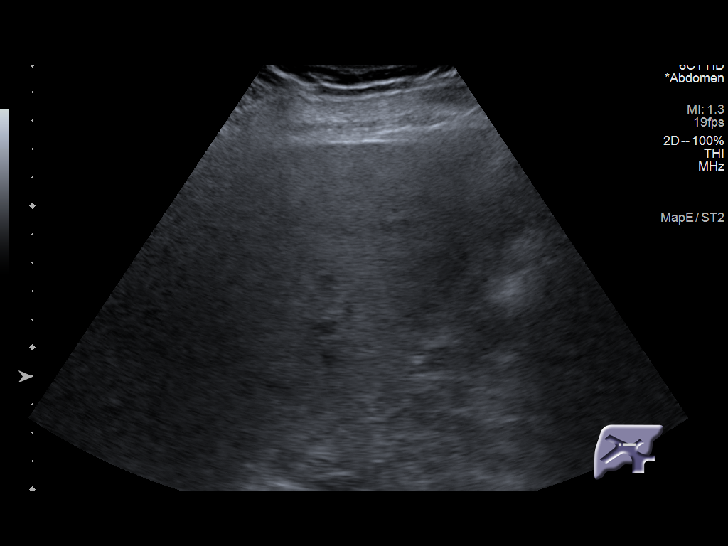
[im 60/66]
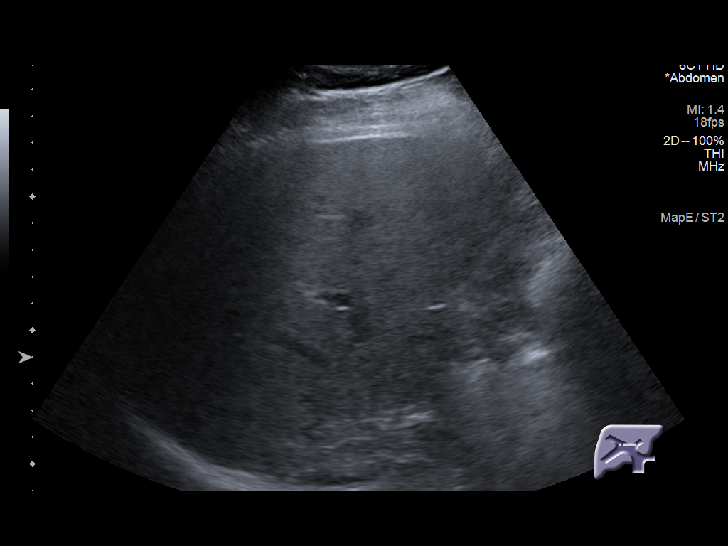
[im 66/66]
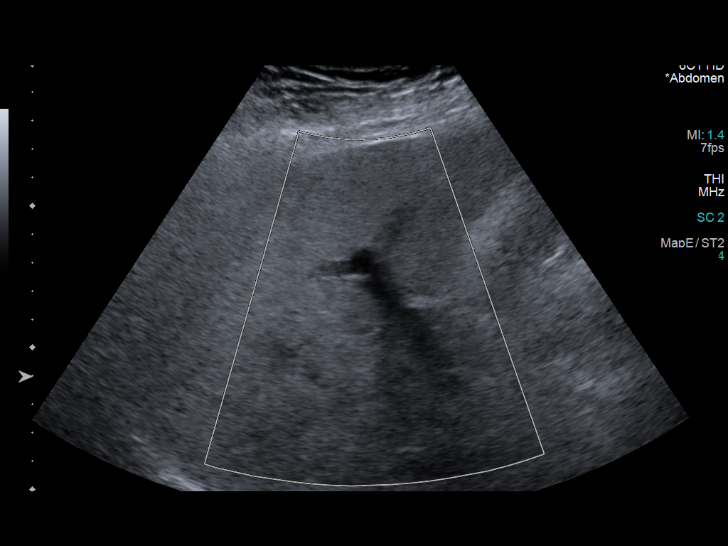

[14 of 25 positions shown; findings below may reference images not displayed]

FINDINGS: Gallbladder:

No gallstones or wall thickening visualized. No sonographic Murphy
sign noted by sonographer.

Common bile duct:

Diameter: 6 mm

Liver:

Coarsened increased liver echotexture unchanged since prior study.
No intrahepatic duct dilation. No focal liver parenchymal
abnormalities. Portal vein is patent on color Doppler imaging with
normal direction of blood flow towards the liver.

Other: None.
IMPRESSION: 1. Stable coarsened increased liver echotexture compatible with
given history of hepatic fibrosis. This could also reflect
underlying cirrhosis or steatosis.

## 2022-12-14 DIAGNOSIS — N390 Urinary tract infection, site not specified: Secondary | ICD-10-CM | POA: Diagnosis not present

## 2022-12-16 ENCOUNTER — Ambulatory Visit: Payer: Medicare Other | Admitting: Podiatry

## 2022-12-16 ENCOUNTER — Encounter: Payer: Self-pay | Admitting: Podiatry

## 2022-12-16 VITALS — BP 135/73

## 2022-12-16 DIAGNOSIS — E119 Type 2 diabetes mellitus without complications: Secondary | ICD-10-CM | POA: Diagnosis not present

## 2022-12-16 DIAGNOSIS — M2041 Other hammer toe(s) (acquired), right foot: Secondary | ICD-10-CM

## 2022-12-16 DIAGNOSIS — M79676 Pain in unspecified toe(s): Secondary | ICD-10-CM

## 2022-12-16 DIAGNOSIS — E1151 Type 2 diabetes mellitus with diabetic peripheral angiopathy without gangrene: Secondary | ICD-10-CM

## 2022-12-16 DIAGNOSIS — L84 Corns and callosities: Secondary | ICD-10-CM | POA: Diagnosis not present

## 2022-12-16 DIAGNOSIS — M2042 Other hammer toe(s) (acquired), left foot: Secondary | ICD-10-CM | POA: Diagnosis not present

## 2022-12-16 DIAGNOSIS — B351 Tinea unguium: Secondary | ICD-10-CM

## 2022-12-16 NOTE — Progress Notes (Unsigned)
ANNUAL DIABETIC FOOT EXAM  Subjective: URI ARCHILLA presents today {jgcomplaint:23593}.  Chief Complaint  Patient presents with   Nail Problem    [facility] PCP-[facility] PCP VST-12/14/2022   Patient confirms h/o diabetes.  Patient relates {Numbers; 0-100:15068} year h/o diabetes.  Patient denies any h/o foot wounds.  Patient has h/o foot ulcer of {jgPodToeLocator:23637}, which healed via help of ***.  Patient has h/o amputation(s): {jgamp:23617}.  Patient endorses symptoms of foot numbness.   Patient endorses symptoms of foot tingling.  Patient endorses symptoms of burning in feet.  Patient endorses symptoms of pins/needles sensation in feet.  Patient denies any numbness, tingling, burning, or pins/needle sensation in feet.  Patient has been diagnosed with neuropathy and it is managed with {JGNEUROPATHYMEDS:27053}.  Risk factors: {jgriskfactors:24044}.  Iona Beard, MD is patient's PCP. Last visit was {Time; dates multiple:15870}***.  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 Current Outpatient Medications on File Prior to Visit  Medication Sig Dispense Refill   amLODipine (NORVASC) 10 MG tablet Take 10 mg by mouth daily.  2    atorvastatin (LIPITOR) 10 MG tablet Take by mouth.     Blood Glucose Monitoring Suppl (ONE TOUCH ULTRA 2) w/Device KIT      cholecalciferol (VITAMIN D) 25 MCG (1000 UNIT) tablet Take by mouth.     ciprofloxacin (CILOXAN) 0.3 % ophthalmic solution      cloNIDine (CATAPRES) 0.1 MG tablet Take by mouth.     Lancets (ONETOUCH DELICA PLUS 123XX123) MISC      metFORMIN (GLUCOPHAGE) 500 MG tablet Take 500 mg by mouth daily.     Multiple Vitamins-Minerals (CENTRUM ADULTS) TABS Take 1 tablet by mouth daily.     nicotine (NICODERM CQ - DOSED IN MG/24 HOURS) 21 mg/24hr patch 21 mg daily.     Omega-3 Fatty Acids (OMEGA III EPA+DHA) 1000 MG CAPS Fish oil     ONETOUCH ULTRA test strip      prednisoLONE acetate (PRED FORTE) 1 % ophthalmic suspension SMARTSIG:4 Drop(s) Right Ear Twice Daily     Sofosbuvir-Velpatasvir 400-100 MG TABS Take 1 tablet by mouth daily.     No current facility-administered medications on file prior to visit.    No Known Allergies Social History   Occupational History   Not on file  Tobacco Use   Smoking status: Every Day    Packs/day: 0.25    Years: 30.00    Total pack years: 7.50    Types: Cigarettes   Smokeless tobacco: Never   Tobacco comments:    Up to 1 pk  daily  Vaping Use   Vaping Use: Never used  Substance and Sexual Activity   Alcohol use: Yes    Comment: socially    Drug use: No  Sexual activity: Not on file   No family history on file. Immunization History  Administered Date(s) Administered   Fluad Quad(high Dose 65+) 06/27/2019   Influenza, High Dose Seasonal PF 07/20/2018     Review of Systems: Negative except as noted in the HPI.   Objective: Vitals:   12/16/22 0910  BP: 135/73    IRAN WALKENHORST is a pleasant 77 y.o. male in NAD. AAO X 3.  Vascular Examination: {jgvascular:23595}  Dermatological Examination: {jgderm:23598}  Neurological Examination: {jgneuro:23601::"Protective sensation intact 5/5 intact bilaterally with 10g  monofilament b/l.","Vibratory sensation intact b/l.","Proprioception intact bilaterally."}  Musculoskeletal Examination: {jgmsk:23600}  Footwear Assessment: Does the patient wear appropriate shoes? {Yes,No}. Does the patient need inserts/orthotics? {Yes,No}.  No results found for: "HGBA1C" VAS [place] ABI WITH/WO TBI  Result Date: 12/03/2022  LOWER EXTREMITY DOPPLER STUDY Patient Name:  SHAYNE HOULT  Date of Exam:   12/02/2022 Medical Rec #: [id number]          Accession #:    [id number] Date of Birth: 07/13/1946         Patient Gender: M Patient Age:   45 years Exam Location:  [facility] Procedure:      VAS [place] ABI WITH/WO TBI Referring Phys: TODD EARLY --------------------------------------------------------------------------------  Indications: Peripheral artery disease. High Risk Factors: Hypertension, hyperlipidemia, Diabetes, current smoker.  Performing Technologist: Ralene Cork RVT  Examination Guidelines: A complete evaluation includes at minimum, Doppler waveform signals and systolic blood pressure reading at the level of bilateral brachial, anterior tibial, and posterior tibial arteries, when vessel segments are accessible. Bilateral testing is considered an integral part of a complete examination. Photoelectric Plethysmograph (PPG) waveforms and toe systolic pressure readings are included as required and additional duplex testing as needed. Limited examinations for reoccurring indications may be performed as noted.  ABI Findings: +---------+------------------+-----+----------+--------+ Right    Rt Pressure (mmHg)IndexWaveform  Comment  +---------+------------------+-----+----------+--------+ Brachial 169                                       +---------+------------------+-----+----------+--------+ PTA      97                0.57 monophasic         +---------+------------------+-----+----------+--------+ DP       108               0.64 monophasic          +---------+------------------+-----+----------+--------+ Great Toe67                0.40                    +---------+------------------+-----+----------+--------+ +---------+------------------+-----+-------------------+-------+ Left     Lt Pressure (mmHg)IndexWaveform           Comment +---------+------------------+-----+-------------------+-------+ Brachial 164                                               +---------+------------------+-----+-------------------+-------+ PTA      82                0.49 dampened monophasic        +---------+------------------+-----+-------------------+-------+ DP       78                0.46 dampened  monophasic        +---------+------------------+-----+-------------------+-------+ Great Toe49                0.29                            +---------+------------------+-----+-------------------+-------+ +-------+-----------+-----------+------------+------------+ ABI/TBIToday's ABIToday's TBIPrevious ABIPrevious TBI +-------+-----------+-----------+------------+------------+ Right  0.64       0.4        0.86        0.72         +-------+-----------+-----------+------------+------------+ Left   0.49       0.29       0.55        0.46         +-------+-----------+-----------+------------+------------+  Previous ABI on 08/13/21.  Summary: Right: Resting right ankle-brachial index indicates moderate right lower extremity arterial disease. The right toe-brachial index is abnormal. Left: Resting left ankle-brachial index indicates severe left lower extremity arterial disease. The left toe-brachial index is abnormal. *See table(s) above for measurements and observations.  Electronically signed by Deitra Mayo MD on 12/03/2022 at 8:30:58 AM.    Final    ADA Risk Categorization: Low Risk :  Patient has all of the following: Intact protective sensation No prior foot ulcer  No severe deformity Pedal pulses present  High  Risk  Patient has one or more of the following: Loss of protective sensation Absent pedal pulses Severe Foot deformity History of foot ulcer  Assessment: 1. Pain due to onychomycosis of toenail   2. Callus   3. Acquired hammertoes of both feet   4. Type II diabetes mellitus with peripheral circulatory disorder (HCC)   5. Encounter for diabetic foot exam ([place])     Plan: No orders of the defined types were placed in this encounter. {jgplan:23602::"-Patient/POA to call should there be question/concern in the interim."} Return in about 3 months (around 03/16/2023).  Marzetta Board, DPM

## 2022-12-20 ENCOUNTER — Other Ambulatory Visit: Payer: Self-pay

## 2022-12-20 ENCOUNTER — Emergency Department (HOSPITAL_COMMUNITY)
Admission: EM | Admit: 2022-12-20 | Discharge: 2022-12-20 | Disposition: A | Discharge status: Home / Self Care | Payer: Medicare Other | Attending: Emergency Medicine | Admitting: Emergency Medicine

## 2022-12-20 ENCOUNTER — Encounter (HOSPITAL_COMMUNITY): Payer: Self-pay

## 2022-12-20 DIAGNOSIS — Z7984 Long term (current) use of oral hypoglycemic drugs: Secondary | ICD-10-CM | POA: Insufficient documentation

## 2022-12-20 DIAGNOSIS — H6121 Impacted cerumen, right ear: Secondary | ICD-10-CM | POA: Insufficient documentation

## 2022-12-20 DIAGNOSIS — Z79899 Other long term (current) drug therapy: Secondary | ICD-10-CM | POA: Insufficient documentation

## 2022-12-20 DIAGNOSIS — H9191 Unspecified hearing loss, right ear: Secondary | ICD-10-CM | POA: Diagnosis not present

## 2022-12-20 MED ORDER — HYDROGEN PEROXIDE 3 % EX SOLN
CUTANEOUS | Status: AC
  Filled 2022-12-20: qty 473

## 2022-12-20 MED ORDER — CARBAMIDE PEROXIDE 6.5 % OT SOLN
5.0000 [drp] | Freq: Once | OTIC | Status: AC
  Administered 2022-12-20: 5 [drp] via OTIC
  Filled 2022-12-20: qty 15

## 2022-12-20 NOTE — Discharge Instructions (Signed)
Wax buildup in your right ear canal has been removed.  You may want to leave your hearing aid out until your ear canal dries.  Follow-up with your primary care provider for recheck

## 2022-12-20 NOTE — ED Provider Notes (Signed)
[facility] Provider Note   CSN: [id number] Arrival date & time: 12/20/22  1019     History  Chief Complaint  Patient presents with   Ear Fullness    Jorge Jefferson is a 77 y.o. male.   Ear Fullness Pertinent negatives include no chest pain, no headaches and no shortness of breath.       Jorge Jefferson is a 77 y.o. 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  Symptoms present for several days.  He was seen at his audiologist on Friday and told his hearing aids were working correctly.  He was also told that he had "white stuff" in his right ear canal.  Does endorse having some nasal congestion recently.  No fever or chills, dizziness, headache, or neck pain.    Home Medications Prior to Admission medications   Medication Sig Start Date End Date Taking? Authorizing Provider  amLODipine (NORVASC) 10 MG tablet Take 10 mg by mouth daily. 04/03/15   [provider]  atorvastatin (LIPITOR) 10 MG tablet Take by mouth. 08/08/21   [provider]  Blood Glucose Monitoring Suppl (ONE TOUCH ULTRA 2) w/Device KIT  11/28/19   [provider]  cholecalciferol (VITAMIN D) 25 MCG (1000 UNIT) tablet Take by mouth.    [provider]  ciprofloxacin (CILOXAN) 0.3 % ophthalmic solution  07/21/19   [provider]  cloNIDine (CATAPRES) 0.1 MG tablet Take by mouth. 09/03/21   [provider]  Lancets (ONETOUCH DELICA PLUS 123XX123) [place]  11/28/19   [provider]  metFORMIN (GLUCOPHAGE) 500 MG tablet Take 500 mg by mouth daily. 10/31/19   [provider]  Multiple Vitamins-Minerals (CENTRUM ADULTS) TABS Take 1 tablet by mouth daily.    [provider]  nicotine (NICODERM CQ - DOSED IN MG/24 HOURS) 21 mg/24hr patch 21 mg daily. 07/29/20   [provider]  Omega-3 Fatty Acids (OMEGA III EPA+DHA) 1000 MG CAPS Fish oil    [provider]  ONETOUCH ULTRA test strip  11/28/19   [provider]  prednisoLONE acetate (PRED FORTE) 1 % ophthalmic suspension SMARTSIG:4 Drop(s) Right Ear Twice Daily 09/13/19   [provider]  Sofosbuvir-Velpatasvir 400-100 MG TABS Take 1 tablet by mouth daily. 08/07/20   [provider]      Allergies    Patient has no known allergies.    Review of Systems   Review of Systems  Constitutional:  Negative for chills and fever.  HENT:  Positive for congestion and ear pain. Negative for facial swelling.   Respiratory:  Negative for shortness of breath.   Cardiovascular:  Negative for chest pain.  Gastrointestinal:  Negative for nausea and vomiting.  Neurological:  Negative for dizziness, weakness, light-headedness, numbness and headaches.    Physical Exam Updated Vital Signs BP (!) 167/69 (BP Location: Right Arm)   Pulse 74   Temp 98 F (36.7 C) (Oral)   Resp 17   Ht '6\' 9"'$  (2.057 m)   Wt 118.4 kg   SpO2 99%   BMI 27.97 kg/m  Physical Exam Vitals and nursing note reviewed.  Constitutional:      General: He is not in acute distress.    Appearance: Normal appearance. He is not toxic-appearing.  HENT:     Head: Atraumatic.     Right Ear: Tympanic membrane  normal. There is impacted cerumen.     Left Ear: Tympanic membrane and ear canal normal.     Ears:     Comments: Significant amount of cerumen within the right ear canal.  TM poorly visualized.  No edema.  Patient does wear hearing aids    Mouth/Throat:     Mouth: Mucous membranes are moist.     Pharynx: Oropharynx is clear.  Cardiovascular:     Rate and Rhythm: Normal rate and regular rhythm.  Pulmonary:     Effort: Pulmonary effort is normal.  Musculoskeletal:        General: Normal range of motion.     Cervical back: Normal range of motion. No rigidity or tenderness.  Lymphadenopathy:      Cervical: No cervical adenopathy.  Skin:    General: Skin is warm.  Neurological:     General: No focal deficit present.     Mental Status: He is alert.     Motor: No weakness.     ED Results / Procedures / Treatments   Labs (all labs ordered are listed, but only abnormal results are displayed) Labs Reviewed - No data to display  EKG None  Radiology No results found.  Procedures Procedures    Medications Ordered in ED Medications  carbamide peroxide (DEBROX) 6.5 % OTIC (EAR) solution 5 drop (5 drops Right EAR Given 12/20/22 1135)  hydrogen peroxide 3 % external solution (  Given 12/20/22 1140)    ED Course/ Medical Decision Making/ A&P                             Medical Decision Making Patient here for evaluation of worsening hearing loss of the right ear.  He wears hearing aids bilaterally.  Hearing aid was checked by audiologist Friday and deemed to be functioning correctly.  Patient was told he had "white stuff" in his ear canal.  He denies headache, dizziness, neck pain or facial pain.  Does endorse some recent congestion  On exam, there is cerumen within the right ear canal.  TM poorly visualized.  No edema  Amount and/or Complexity of Data Reviewed Discussion of management or test interpretation with external provider(s): Right ear canal was irrigated by nursing staff, patient reports improvement of his hearing.  On my repeat exam after the irrigation, the TM is now well-visualized without bulging or erythema.  There is no perforation.  Canal is now cleared of cerumen.  Patient appropriate for discharge home.  Risk OTC drugs.           Final Clinical Impression(s) / ED Diagnoses Final diagnoses:  Hearing loss of right ear due to cerumen impaction    Rx / DC Orders ED Discharge Orders     None         Kem Parkinson, PA-C 12/20/22 1226    Fredia Sorrow, MD 12/21/22 1849

## 2022-12-20 NOTE — ED Triage Notes (Signed)
States he is having trouble hearing as well in right ear, went to have hearing aids checked Friday & was told they were working properly.  At the clinic they told him he had "white stuff coming out of his ear."  Pt states he has been congested recently.

## 2022-12-23 ENCOUNTER — Ambulatory Visit: Payer: Medicare Other | Admitting: Vascular Surgery

## 2022-12-23 ENCOUNTER — Other Ambulatory Visit: Payer: Self-pay

## 2022-12-23 DIAGNOSIS — I739 Peripheral vascular disease, unspecified: Secondary | ICD-10-CM

## 2022-12-28 ENCOUNTER — Telehealth: Payer: Self-pay

## 2022-12-28 NOTE — Telephone Encounter (Signed)
     Patient  visit on 12/20/2022  at [facility] was for ear fullness, hearing loss.  Have you been able to follow up with your primary care physician? Patient plans to make an appointment this week.  The patient was or was not able to obtain any needed medicine or equipment. No medication prescribed.  Are there diet recommendations that you are having difficulty following? No  Patient expresses understanding of discharge instructions and education provided has no other needs at this time. Yes   [facility] Resource Care Guide   [email or web address]  ?? [phone]   Website: [email or web address]  [email or web address]

## 2023-01-07 ENCOUNTER — Encounter (HOSPITAL_COMMUNITY): Payer: Self-pay | Admitting: Pharmacy Technician

## 2023-01-07 ENCOUNTER — Emergency Department (HOSPITAL_COMMUNITY)
Admission: EM | Admit: 2023-01-07 | Discharge: 2023-01-07 | Disposition: A | Discharge status: Home / Self Care | Payer: Medicare Other | Attending: Emergency Medicine | Admitting: Emergency Medicine

## 2023-01-07 ENCOUNTER — Other Ambulatory Visit: Payer: Self-pay

## 2023-01-07 DIAGNOSIS — U071 COVID-19: Secondary | ICD-10-CM

## 2023-01-07 DIAGNOSIS — I10 Essential (primary) hypertension: Secondary | ICD-10-CM | POA: Diagnosis not present

## 2023-01-07 DIAGNOSIS — Z79899 Other long term (current) drug therapy: Secondary | ICD-10-CM | POA: Insufficient documentation

## 2023-01-07 DIAGNOSIS — R059 Cough, unspecified: Secondary | ICD-10-CM | POA: Diagnosis present

## 2023-01-07 LAB — BASIC METABOLIC PANEL
Anion gap: 9 (ref 5–15)
BUN: 18 mg/dL (ref 8–23)
CO2: 21 mmol/L — ABNORMAL LOW (ref 22–32)
Calcium: 8.9 mg/dL (ref 8.9–10.3)
Chloride: 104 mmol/L (ref 98–111)
Creatinine, Ser: 1.29 mg/dL — ABNORMAL HIGH (ref 0.61–1.24)
GFR, Estimated: 57 mL/min — ABNORMAL LOW (ref >60)
Glucose, Bld: 102 mg/dL — ABNORMAL HIGH (ref 70–99)
Potassium: 3.9 mmol/L (ref 3.5–5.1)
Sodium: 134 mmol/L — ABNORMAL LOW (ref 135–145)

## 2023-01-07 LAB — CBC WITH DIFFERENTIAL/PLATELET
Abs Immature Granulocytes: 0.01 10*3/uL (ref 0.00–0.07)
Basophils Absolute: 0 10*3/uL (ref 0.0–0.1)
Basophils Relative: 0 %
Eosinophils Absolute: 0 10*3/uL (ref 0.0–0.5)
Eosinophils Relative: 1 %
HCT: 41.1 % (ref 39.0–52.0)
Hemoglobin: 13.9 g/dL (ref 13.0–17.0)
Immature Granulocytes: 0 %
Lymphocytes Relative: 25 %
Lymphs Abs: 1.4 10*3/uL (ref 0.7–4.0)
MCH: 31 pg (ref 26.0–34.0)
MCHC: 33.8 g/dL (ref 30.0–36.0)
MCV: 91.5 fL (ref 80.0–100.0)
Monocytes Absolute: 1 10*3/uL (ref 0.1–1.0)
Monocytes Relative: 18 %
Neutro Abs: 3.1 10*3/uL (ref 1.7–7.7)
Neutrophils Relative %: 56 %
Platelets: 150 10*3/uL (ref 150–400)
RBC: 4.49 MIL/uL (ref 4.22–5.81)
RDW: 13.5 % (ref 11.5–15.5)
WBC: 5.5 10*3/uL (ref 4.0–10.5)
nRBC: 0 % (ref 0.0–0.2)

## 2023-01-07 LAB — RESP PANEL BY RT-PCR (RSV, FLU A&B, COVID)  RVPGX2
Influenza A by PCR: NEGATIVE
Influenza B by PCR: NEGATIVE
Resp Syncytial Virus by PCR: NEGATIVE
SARS Coronavirus 2 by RT PCR: POSITIVE — AB

## 2023-01-07 MED ORDER — PAXLOVID (150/100) 10 X 150 MG & 10 X 100MG PO TBPK: 2.0000 | ORAL_TABLET | Freq: Two times a day (BID) | ORAL | 0 refills | Status: AC

## 2023-01-07 NOTE — Discharge Instructions (Signed)
Take Tylenol or Motrin for fevers and aches.  Drink plenty of fluids.  Do not take your Lipitor also called atorvastatin  while you are taking the Paxlovid.

## 2023-01-07 NOTE — ED Triage Notes (Signed)
Pt here with reports of wanting a covid test. States wife tested positive for covid yesterday. Pt endorses having a slight headache and having chills.

## 2023-01-07 NOTE — ED Provider Notes (Signed)
[facility] Provider Note   CSN: [id number] Arrival date & time: 01/07/23  0710     History  Chief Complaint  Patient presents with   Covid Exposure    Jorge Jefferson is a 77 y.o. male.  Patient has a history of hypertension.  His wife is positive for COVID.  He is having some cough and congestion  The history is provided by the patient and medical records. No language interpreter was used.  Cough Cough characteristics:  Non-productive Sputum characteristics:  Nondescript Severity:  Moderate Onset quality:  Sudden Timing:  Constant Progression:  Worsening Smoker: no   Relieved by:  Nothing Worsened by:  Nothing Associated symptoms: no chest pain, no eye discharge, no headaches and no rash        Home Medications Prior to Admission medications   Medication Sig Start Date End Date Taking? Authorizing Provider  nirmatrelvir & ritonavir (PAXLOVID, 150/100,) 10 x 150 MG & 10 x 100MG  TBPK Take 2 tablets by mouth 2 (two) times daily for 5 days. 01/07/23 01/12/23 Yes Milton Ferguson, MD  amLODipine (NORVASC) 10 MG tablet Take 10 mg by mouth daily. 04/03/15   [provider]  atorvastatin (LIPITOR) 10 MG tablet Take by mouth. 08/08/21   [provider]  Blood Glucose Monitoring Suppl (ONE TOUCH ULTRA 2) w/Device KIT  11/28/19   [provider]  cholecalciferol (VITAMIN D) 25 MCG (1000 UNIT) tablet Take by mouth.    [provider]  ciprofloxacin (CILOXAN) 0.3 % ophthalmic solution  07/21/19   [provider]  cloNIDine (CATAPRES) 0.1 MG tablet Take by mouth. 09/03/21   [provider]  Lancets (ONETOUCH DELICA PLUS 123XX123) [place]  11/28/19   [provider]  metFORMIN (GLUCOPHAGE) 500 MG tablet Take 500 mg by mouth daily. 10/31/19   [provider]  Multiple Vitamins-Minerals (CENTRUM ADULTS) TABS Take 1 tablet by mouth daily.    [provider]  nicotine  (NICODERM CQ - DOSED IN MG/24 HOURS) 21 mg/24hr patch 21 mg daily. 07/29/20   [provider]  Omega-3 Fatty Acids (OMEGA III EPA+DHA) 1000 MG CAPS Fish oil    [provider]  ONETOUCH ULTRA test strip  11/28/19   [provider]  prednisoLONE acetate (PRED FORTE) 1 % ophthalmic suspension SMARTSIG:4 Drop(s) Right Ear Twice Daily 09/13/19   [provider]  Sofosbuvir-Velpatasvir 400-100 MG TABS Take 1 tablet by mouth daily. 08/07/20   [provider]      Allergies    Patient has no known allergies.    Review of Systems   Review of Systems  Constitutional:  Negative for appetite change and fatigue.  HENT:  Negative for congestion, ear discharge and sinus pressure.   Eyes:  Negative for discharge.  Respiratory:  Positive for cough.   Cardiovascular:  Negative for chest pain.  Gastrointestinal:  Negative for abdominal pain and diarrhea.  Genitourinary:  Negative for frequency and hematuria.  Musculoskeletal:  Negative for back pain.  Skin:  Negative for rash.  Neurological:  Negative for seizures and headaches.  Psychiatric/Behavioral:  Negative for hallucinations.     Physical Exam Updated Vital Signs BP (!) 166/74   Pulse 78   Temp 99.2 F (37.3 C) (Oral)   Resp 16   SpO2 98%  Physical Exam Vitals and nursing note reviewed.  Constitutional:      Appearance: He is well-developed.  HENT:     Head: Normocephalic.  Nose: Nose normal.  Eyes:     General: No scleral icterus.    Conjunctiva/sclera: Conjunctivae normal.  Neck:     Thyroid: No thyromegaly.  Cardiovascular:     Rate and Rhythm: Normal rate and regular rhythm.     Heart sounds: No murmur heard.    No friction rub. No gallop.  Pulmonary:     Breath sounds: No stridor. No wheezing or rales.  Chest:     Chest wall: No tenderness.  Abdominal:     General: There is no distension.     Tenderness: There is no abdominal tenderness. There is no rebound.   Musculoskeletal:        General: Normal range of motion.     Cervical back: Neck supple.  Lymphadenopathy:     Cervical: No cervical adenopathy.  Skin:    Findings: No erythema or rash.  Neurological:     Mental Status: He is alert and oriented to person, place, and time.     Motor: No abnormal muscle tone.     Coordination: Coordination normal.  Psychiatric:        Behavior: Behavior normal.     ED Results / Procedures / Treatments   Labs (all labs ordered are listed, but only abnormal results are displayed) Labs Reviewed  RESP PANEL BY RT-PCR (RSV, FLU A&B, COVID)  [id number] - Abnormal; Notable for the following components:      Result Value   SARS Coronavirus 2 by RT PCR POSITIVE (*)    All other components within normal limits  CBC WITH DIFFERENTIAL/PLATELET    EKG None  Radiology No results found.  Procedures Procedures    Medications Ordered in ED Medications - No data to display  ED Course/ Medical Decision Making/ A&P                             Medical Decision Making Amount and/or Complexity of Data Reviewed Labs: ordered.  Risk Prescription drug management.   Patient with positive COVID test.  He would like Paxlovid.  He is started on a reduced dose and will stop his Lipitor while he is taking it        Final Clinical Impression(s) / ED Diagnoses Final diagnoses:  COVID    Rx / DC Orders ED Discharge Orders          Ordered    Basic metabolic panel        [id number]    nirmatrelvir & ritonavir (PAXLOVID, 150/100,) 10 x 150 MG & 10 x 100MG  TBPK  2 times daily        01/07/23 [id number]              Milton Ferguson, MD 01/08/23 1851

## 2023-01-11 ENCOUNTER — Encounter (HOSPITAL_COMMUNITY): Payer: Self-pay | Admitting: Radiology

## 2023-01-11 ENCOUNTER — Ambulatory Visit (HOSPITAL_COMMUNITY)
Admission: RE | Admit: 2023-01-11 | Discharge: 2023-01-11 | Disposition: A | Discharge status: Home / Self Care | Payer: Medicare Other | Source: Ambulatory Visit | Attending: Vascular Surgery | Admitting: Vascular Surgery

## 2023-01-11 DIAGNOSIS — I739 Peripheral vascular disease, unspecified: Secondary | ICD-10-CM | POA: Diagnosis not present

## 2023-01-11 DIAGNOSIS — I745 Embolism and thrombosis of iliac artery: Secondary | ICD-10-CM | POA: Diagnosis not present

## 2023-01-11 MED ORDER — IOHEXOL 350 MG/ML SOLN
100.0000 mL | Freq: Once | INTRAVENOUS | Status: AC | PRN
  Administered 2023-01-11: 100 mL via INTRAVENOUS

## 2023-01-12 DIAGNOSIS — U071 COVID-19: Secondary | ICD-10-CM | POA: Diagnosis not present

## 2023-01-13 ENCOUNTER — Ambulatory Visit: Payer: Medicare Other | Admitting: Vascular Surgery

## 2023-01-13 ENCOUNTER — Encounter: Payer: Self-pay | Admitting: Vascular Surgery

## 2023-01-13 VITALS — BP 162/67 | HR 66 | Temp 98.4°F | Ht >= 80 in | Wt 270.8 lb

## 2023-01-13 DIAGNOSIS — I70212 Atherosclerosis of native arteries of extremities with intermittent claudication, left leg: Secondary | ICD-10-CM

## 2023-01-13 NOTE — Progress Notes (Signed)
[facility] [place]  Patient name: Jorge Jefferson MRN: [id number] DOB: 1946/08/20 Sex: male  REASON FOR VISIT: Continued discussion of intermittent claudication and follow-up after recent CT angiogram aorta and runoff  HPI: Jorge Jefferson is a 77 y.o. male here today for continued discussion of intermittent claudication.  He reports of bilateral hip claudication symptoms more so on the left than on the right.  He also reports left calf claudication.  Fortunately he has no history of tissue loss.  Past Medical History:  Diagnosis Date   Hearing loss    Hyperlipidemia    Hypertension    Renal disorder     History reviewed. No pertinent family history.  SOCIAL HISTORY: Social History   Tobacco Use   Smoking status: Every Day    Packs/day: 0.25    Years: 30.00    Additional pack years: 0.00    Total pack years: 7.50    Types: Cigarettes   Smokeless tobacco: Never   Tobacco comments:    Up to 1 pk  daily  Substance Use Topics   Alcohol use: Yes    Comment: socially     No Known Allergies  Current Outpatient Medications  Medication Sig Dispense Refill   amLODipine (NORVASC) 10 MG tablet Take 10 mg by mouth daily.  2   atorvastatin (LIPITOR) 10 MG tablet Take by mouth.     Blood Glucose Monitoring Suppl (ONE TOUCH ULTRA 2) w/Device KIT      cholecalciferol (VITAMIN D) 25 MCG (1000 UNIT) tablet Take by mouth.     ciprofloxacin (CILOXAN) 0.3 % ophthalmic solution      cloNIDine (CATAPRES) 0.1 MG tablet Take by mouth.     Lancets (ONETOUCH DELICA PLUS 123XX123) MISC      metFORMIN (GLUCOPHAGE) 500 MG tablet Take 500 mg by mouth daily.     Multiple Vitamins-Minerals (CENTRUM ADULTS) TABS Take 1 tablet by mouth daily.     nicotine (NICODERM CQ - DOSED IN MG/24 HOURS) 21 mg/24hr patch 21 mg daily.     Omega-3 Fatty Acids (OMEGA III EPA+DHA) 1000 MG CAPS Fish oil     ONETOUCH ULTRA test strip      prednisoLONE  acetate (PRED FORTE) 1 % ophthalmic suspension SMARTSIG:4 Drop(s) Right Ear Twice Daily     Sofosbuvir-Velpatasvir 400-100 MG TABS Take 1 tablet by mouth daily.     No current facility-administered medications for this visit.    REVIEW OF SYSTEMS:  [X]  denotes positive finding, [ ]  denotes negative finding Cardiac  Comments:  Chest pain or chest pressure:    Shortness of breath upon exertion:    Short of breath when lying flat:    Irregular heart rhythm:        Vascular    Pain in calf, thigh, or hip brought on by ambulation: x   Pain in feet at night that wakes you up from your sleep:     Blood clot in your veins:    Leg swelling:           PHYSICAL EXAM: Vitals:   01/13/23 1426  BP: (!) 162/67  Pulse: 66  Temp: 98.4 F (36.9 C)  Weight: 270 lb 12.8 oz (122.8 kg)  Height: 6\' 9"  (2.057 m)    GENERAL: The patient is a well-nourished male, in no acute distress. The vital signs are documented above. [name]  DATA:  I reviewed the CT images with the patient.  This shows no evidence of  aortic occlusive disease.  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  He does not have any flow limitation in his right iliac artery or common femoral artery.  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  He has reconstitution of the popliteal artery.  On the right he does have several areas areas of high degree of stenosis in the superficial femoral artery  MEDICAL ISSUES: I explained to the patient this is not limb threatening.  His main issue is left hip and calf claudication symptoms.  He is a very active person and this is quite limiting to him.  I have recommended arteriography as his next evaluation.  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  I feel that there is very high likelihood that this will resolve his hip claudication and would in all likelihood markedly improved his calf claudication as well.  I would not recommend treatment of his left SFA disease unless he has progressive symptoms.  We will coordinate outpatient arteriogram and probable intervention at [facility] at his convenience    Rosetta Posner, MD [facility] Tel [phone]  Note: Portions of this report may have been transcribed using voice recognition software.  Every effort has been made to ensure accuracy; however, inadvertent computerized transcription errors may still be present.

## 2023-01-14 ENCOUNTER — Other Ambulatory Visit: Payer: Self-pay

## 2023-01-14 DIAGNOSIS — I70212 Atherosclerosis of native arteries of extremities with intermittent claudication, left leg: Secondary | ICD-10-CM

## 2023-01-14 DIAGNOSIS — I739 Peripheral vascular disease, unspecified: Secondary | ICD-10-CM

## 2023-01-20 ENCOUNTER — Telehealth: Payer: Self-pay

## 2023-01-20 NOTE — Telephone Encounter (Signed)
Called pt to r/s his procedure and he wants to call us back after he returns from his trip to schedule. He will call us at that time.

## 2023-01-25 DIAGNOSIS — H6993 Unspecified Eustachian tube disorder, bilateral: Secondary | ICD-10-CM | POA: Diagnosis not present

## 2023-01-25 DIAGNOSIS — J3489 Other specified disorders of nose and nasal sinuses: Secondary | ICD-10-CM | POA: Diagnosis not present

## 2023-01-25 DIAGNOSIS — H906 Mixed conductive and sensorineural hearing loss, bilateral: Secondary | ICD-10-CM | POA: Diagnosis not present

## 2023-01-28 ENCOUNTER — Ambulatory Visit (HOSPITAL_COMMUNITY): Admission: RE | Admit: 2023-01-28 | Payer: Medicare Other | Source: Home / Self Care | Admitting: Vascular Surgery

## 2023-01-28 ENCOUNTER — Encounter (HOSPITAL_COMMUNITY): Admission: RE | Payer: Self-pay | Source: Home / Self Care

## 2023-01-28 SURGERY — ABDOMINAL AORTOGRAM W/LOWER EXTREMITY
Anesthesia: LOCAL

## 2023-02-25 ENCOUNTER — Telehealth: Payer: Self-pay

## 2023-02-25 NOTE — Telephone Encounter (Signed)
Patient returned call. He wants to get rescheduled for aortogram but stated he would like to discuss with Dr. Chestine Spore again, since it has been a few months. Will have patient scheduled for a phone visit to discuss further.

## 2023-02-25 NOTE — Telephone Encounter (Signed)
Voicemail received from patient, ready to reschedule aortogram. Returned call to patient, no answer. Left VM to return call.

## 2023-03-01 ENCOUNTER — Ambulatory Visit (INDEPENDENT_AMBULATORY_CARE_PROVIDER_SITE_OTHER): Payer: Medicare Other | Admitting: Vascular Surgery

## 2023-03-01 ENCOUNTER — Other Ambulatory Visit: Payer: Self-pay

## 2023-03-01 DIAGNOSIS — I70212 Atherosclerosis of native arteries of extremities with intermittent claudication, left leg: Secondary | ICD-10-CM

## 2023-03-01 DIAGNOSIS — I70219 Atherosclerosis of native arteries of extremities with intermittent claudication, unspecified extremity: Secondary | ICD-10-CM | POA: Insufficient documentation

## 2023-03-01 NOTE — Progress Notes (Signed)
    Virtual Visit via Telephone Note  I connected with Jorge Jefferson on 03/01/2023 using the [email or web address] by telephone and verified that I was speaking with the correct person using two identifiers. Patient was located at home and accompanied by himself. I am located at [facility].   The limitations of evaluation and management by telemedicine and the availability of in person appointments have been previously discussed with the patient and are documented in the patients chart. The patient expressed understanding and consented to proceed.  PCP: Mirna Mires, MD  Chief Complaint: Lower extremity claudication  History of Present Illness: Jorge Jefferson is a 77 y.o. male with history hypertension, hyperlipidemia, tobacco abuse that presents for phone visit to discuss intervention on his lower extremity claudication.  He states his left leg is worse.  States he cannot walk to his carin  front of his apartment.  He describes hip and calf claudication.  He was seen by Dr. Arbie Cookey and offered angiogram with a focus on the left leg with iliac and SFA occlusive disease.  Patient just wanted to clarify the procedure.  States he is trying to cut back on smoking.  Still smoking 1 pack/day.  No previous vascular interventions.    Past Medical History:  Diagnosis Date   Hearing loss    Hyperlipidemia    Hypertension    Renal disorder     No past surgical history on file.  No outpatient medications have been marked as taking for the 03/01/23 encounter (Appointment) with Cephus Shelling, MD.    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  Assessment and Plan:   77 year old male with history of hypertension, hyperlipidemia, tobacco abuse that presents for phone visit to further discuss  intervention on his left lower extremity for disabling lifestyle limiting claudication.  He was seen by Dr. Arbie Cookey in [place] in March and was offered intervention with arteriography.  I answered a number of his questions and explained the procedure in detail.  Discussed intervention for claudication is elective.  Discussed that this should be delayed until he has disabling claudication and has failed medical management.  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  He has multilevel occlusive disease.  I discussed transfemoral access with lower extremity angiogram and possible intervention on the iliac and femoral segment with a focus on the left leg.  I discussed the importance of smoking cessation.  Follow Up Instructions:   Follow up: Patient wishes to be scheduled for left leg angio with intervention   I discussed the assessment and treatment plan with the patient. The patient was provided an opportunity to ask questions and all were answered. The patient agreed with the plan and demonstrated an understanding of the instructions.   The patient was advised to call back or seek an in-person evaluation if the symptoms worsen or if the condition fails to improve as anticipated.  I spent 11 minutes with the patient via telephone encounter.   Signed, Cephus Shelling [facility] [place] Office: [phone]  03/01/2023, 10:23 AM

## 2023-03-18 DIAGNOSIS — H906 Mixed conductive and sensorineural hearing loss, bilateral: Secondary | ICD-10-CM | POA: Diagnosis not present

## 2023-03-18 DIAGNOSIS — H6993 Unspecified Eustachian tube disorder, bilateral: Secondary | ICD-10-CM | POA: Diagnosis not present

## 2023-03-18 DIAGNOSIS — H7291 Unspecified perforation of tympanic membrane, right ear: Secondary | ICD-10-CM | POA: Insufficient documentation

## 2023-03-19 ENCOUNTER — Other Ambulatory Visit: Payer: Self-pay | Admitting: Nurse Practitioner

## 2023-03-19 DIAGNOSIS — E441 Mild protein-calorie malnutrition: Secondary | ICD-10-CM | POA: Diagnosis not present

## 2023-03-19 DIAGNOSIS — K760 Fatty (change of) liver, not elsewhere classified: Secondary | ICD-10-CM | POA: Diagnosis not present

## 2023-03-19 DIAGNOSIS — K7402 Hepatic fibrosis, advanced fibrosis: Secondary | ICD-10-CM

## 2023-03-25 ENCOUNTER — Encounter (HOSPITAL_COMMUNITY)
Admission: RE | Disposition: A | Discharge status: Home / Self Care | Payer: Self-pay | Source: Home / Self Care | Attending: Vascular Surgery

## 2023-03-25 ENCOUNTER — Ambulatory Visit (HOSPITAL_COMMUNITY)
Admission: RE | Admit: 2023-03-25 | Discharge: 2023-03-25 | Disposition: A | Discharge status: Home / Self Care | Payer: Medicare Other | Attending: Vascular Surgery | Admitting: Vascular Surgery

## 2023-03-25 DIAGNOSIS — I10 Essential (primary) hypertension: Secondary | ICD-10-CM | POA: Diagnosis not present

## 2023-03-25 DIAGNOSIS — F17210 Nicotine dependence, cigarettes, uncomplicated: Secondary | ICD-10-CM | POA: Diagnosis not present

## 2023-03-25 DIAGNOSIS — E785 Hyperlipidemia, unspecified: Secondary | ICD-10-CM | POA: Insufficient documentation

## 2023-03-25 DIAGNOSIS — I70212 Atherosclerosis of native arteries of extremities with intermittent claudication, left leg: Secondary | ICD-10-CM | POA: Insufficient documentation

## 2023-03-25 HISTORY — PX: ABDOMINAL AORTOGRAM W/LOWER EXTREMITY: CATH118223

## 2023-03-25 HISTORY — PX: PERIPHERAL VASCULAR INTERVENTION: CATH118257

## 2023-03-25 LAB — POCT I-STAT, CHEM 8
BUN: 20 mg/dL (ref 8–23)
Calcium, Ion: 1.25 mmol/L (ref 1.15–1.40)
Chloride: 106 mmol/L (ref 98–111)
Creatinine, Ser: 1.3 mg/dL — ABNORMAL HIGH (ref 0.61–1.24)
Glucose, Bld: 125 mg/dL — ABNORMAL HIGH (ref 70–99)
HCT: 41 % (ref 39.0–52.0)
Hemoglobin: 13.9 g/dL (ref 13.0–17.0)
Potassium: 4.1 mmol/L (ref 3.5–5.1)
Sodium: 142 mmol/L (ref 135–145)
TCO2: 26 mmol/L (ref 22–32)

## 2023-03-25 LAB — POCT ACTIVATED CLOTTING TIME
Activated Clotting Time: 269 seconds
Activated Clotting Time: 189 seconds
Activated Clotting Time: 214 seconds
Activated Clotting Time: 171 seconds
Activated Clotting Time: 177 seconds

## 2023-03-25 SURGERY — ABDOMINAL AORTOGRAM W/LOWER EXTREMITY
Anesthesia: LOCAL

## 2023-03-25 MED ORDER — CLOPIDOGREL BISULFATE 75 MG PO TABS: 300.0000 mg | ORAL_TABLET | Freq: Once | ORAL | Status: DC

## 2023-03-25 MED ORDER — LABETALOL HCL 5 MG/ML IV SOLN: 10.0000 mg | INTRAVENOUS | Status: DC | PRN

## 2023-03-25 MED ORDER — HEPARIN SODIUM (PORCINE) 1000 UNIT/ML IJ SOLN
INTRAMUSCULAR | Status: DC | PRN
  Administered 2023-03-25: 11000 [IU] via INTRAVENOUS

## 2023-03-25 MED ORDER — ACETAMINOPHEN 325 MG PO TABS: 650.0000 mg | ORAL_TABLET | ORAL | Status: DC | PRN

## 2023-03-25 MED ORDER — CLOPIDOGREL BISULFATE 75 MG PO TABS: 75.0000 mg | ORAL_TABLET | Freq: Every day | ORAL | Status: DC

## 2023-03-25 MED ORDER — FENTANYL CITRATE (PF) 100 MCG/2ML IJ SOLN
INTRAMUSCULAR | Status: AC
  Filled 2023-03-25: qty 2

## 2023-03-25 MED ORDER — SODIUM CHLORIDE 0.9% FLUSH: 3.0000 mL | Freq: Two times a day (BID) | INTRAVENOUS | Status: DC

## 2023-03-25 MED ORDER — LIDOCAINE HCL (PF) 1 % IJ SOLN
INTRAMUSCULAR | Status: AC
  Filled 2023-03-25: qty 60

## 2023-03-25 MED ORDER — HEPARIN SODIUM (PORCINE) 1000 UNIT/ML IJ SOLN
INTRAMUSCULAR | Status: AC
  Filled 2023-03-25: qty 10

## 2023-03-25 MED ORDER — LIDOCAINE HCL (PF) 1 % IJ SOLN
INTRAMUSCULAR | Status: DC | PRN
  Administered 2023-03-25: 15 mL

## 2023-03-25 MED ORDER — IODIXANOL 320 MG/ML IV SOLN
INTRAVENOUS | Status: DC | PRN
  Administered 2023-03-25: 50 mL via INTRA_ARTERIAL

## 2023-03-25 MED ORDER — SODIUM CHLORIDE 0.9 % IV SOLN: INTRAVENOUS | Status: DC

## 2023-03-25 MED ORDER — ONDANSETRON HCL 4 MG/2ML IJ SOLN: 4.0000 mg | Freq: Four times a day (QID) | INTRAMUSCULAR | Status: DC | PRN

## 2023-03-25 MED ORDER — SODIUM CHLORIDE 0.9 % IV SOLN: 250.0000 mL | INTRAVENOUS | Status: DC | PRN

## 2023-03-25 MED ORDER — HYDRALAZINE HCL 20 MG/ML IJ SOLN: 5.0000 mg | INTRAMUSCULAR | Status: DC | PRN

## 2023-03-25 MED ORDER — MIDAZOLAM HCL 2 MG/2ML IJ SOLN
INTRAMUSCULAR | Status: DC | PRN
  Administered 2023-03-25: 1 mg via INTRAVENOUS

## 2023-03-25 MED ORDER — SODIUM CHLORIDE 0.9% FLUSH: 3.0000 mL | INTRAVENOUS | Status: DC | PRN

## 2023-03-25 MED ORDER — CLOPIDOGREL BISULFATE 75 MG PO TABS: 75.0000 mg | ORAL_TABLET | Freq: Every day | ORAL | 11 refills | Status: DC

## 2023-03-25 MED ORDER — FENTANYL CITRATE (PF) 100 MCG/2ML IJ SOLN
INTRAMUSCULAR | Status: DC | PRN
  Administered 2023-03-25 (×2): 25 ug via INTRAVENOUS

## 2023-03-25 MED ORDER — CLOPIDOGREL BISULFATE 300 MG PO TABS
ORAL_TABLET | ORAL | Status: DC | PRN
  Administered 2023-03-25: 300 mg via ORAL

## 2023-03-25 MED ORDER — CLOPIDOGREL BISULFATE 300 MG PO TABS
ORAL_TABLET | ORAL | Status: AC
  Filled 2023-03-25: qty 1

## 2023-03-25 MED ORDER — HEPARIN (PORCINE) IN NACL 1000-0.9 UT/500ML-% IV SOLN
INTRAVENOUS | Status: DC | PRN
  Administered 2023-03-25 (×2): 500 mL

## 2023-03-25 MED ORDER — SODIUM CHLORIDE 0.9 % IV SOLN: INTRAVENOUS | Status: AC

## 2023-03-25 MED ORDER — MIDAZOLAM HCL 2 MG/2ML IJ SOLN
INTRAMUSCULAR | Status: AC
  Filled 2023-03-25: qty 2

## 2023-03-25 SURGICAL SUPPLY — 18 items
BALLN MUSTANG 7.0X40 135 (BALLOONS) ×2
BALLOON MUSTANG 7.0X40 135 (BALLOONS) IMPLANT
CATH OMNI FLUSH 5F 65CM (CATHETERS) IMPLANT
GLIDEWIRE ADV .035X260CM (WIRE) IMPLANT
KIT ENCORE 26 ADVANTAGE (KITS) IMPLANT
KIT MICROPUNCTURE NIT STIFF (SHEATH) IMPLANT
KIT PV (KITS) ×2 IMPLANT
SHEATH CATAPULT 6F 45 MP (SHEATH) IMPLANT
SHEATH PINNACLE 5F 10CM (SHEATH) IMPLANT
SHEATH PINNACLE 6F 10CM (SHEATH) IMPLANT
SHEATH PROBE COVER 6X72 (BAG) IMPLANT
STENT INNOVA 8X40X130 (Permanent Stent) IMPLANT
STOPCOCK MORSE 400PSI 3WAY (MISCELLANEOUS) IMPLANT
SYR MEDRAD MARK 7 150ML (SYRINGE) ×2 IMPLANT
TRANSDUCER W/STOPCOCK (MISCELLANEOUS) ×2 IMPLANT
TRAY PV CATH (CUSTOM PROCEDURE TRAY) ×2 IMPLANT
TUBING CIL FLEX 10 FLL-RA (TUBING) IMPLANT
WIRE STARTER BENTSON 035X150 (WIRE) IMPLANT

## 2023-03-25 NOTE — Progress Notes (Signed)
71F arterial sheath right groin removed by Dorthey Sawyer., RN from [facility]..  Manual pressure held for 30 minutes. Right groin site level 0 pre and post pull. Right DP pulse palpable post sheath pull.  Bedrest started ay 1200.  Gauze and tegaderm applied to right groin.  Instructions reviewed with patient.

## 2023-03-25 NOTE — Progress Notes (Signed)
Patient and wife was given discharge instructions. Both verbalized understanding. 

## 2023-03-25 NOTE — Op Note (Signed)
Patient name: Jorge Jefferson MRN: [id number] DOB: 26-Nov-1945 Sex: male  03/25/2023 Pre-operative Diagnosis: Disabling lifestyle limiting claudication of the left lower extremity Post-operative diagnosis:  Same Surgeon:  Cephus Shelling, MD Procedure Performed: 1.  Ultrasound-guided access right common femoral artery 2.  Aortogram with catheter selection of aorta 3.  Left external iliac artery angioplasty with stent placement (stent with 8 mm x 40 mm Innova and postdilated with a 7 mm Mustang) 4.  Left lower extremity arteriogram with runoff 5.  31 minutes of monitored moderate conscious sedation time  Indications: 77 year old male seen by Dr. Arbie Cookey in [place] with disabling left lower extremity claudication.  He was noted to have evidence of an occluded versus high grade stenosis of left external iliac artery as well as an SFA occlusion.  Dr. Arbie Cookey recommended intervention on the iliac artery to see if this would improve his symptoms.  He presents after risks benefits discussed.  Findings:   Ultrasound-guided access right common femoral artery.  Aortogram showed both renal arteries are patent.  The infrarenal aorta is patent.  The right iliac system is patent with an occluded hypogastric.  The left common iliac is widely patent.  The left hypogastric had a high-grade proximal stenosis and is diseased and small.  There was a high-grade 80% stenosis of the proximal left external iliac artery.  Distally the common femoral and profunda were patent.  There is a short stump of SFA.  Long SFA occlusion with patent popliteal artery above and below the knee.  He has a diseased three-vessel tibial runoff.  Ultimately the left external iliac stenosis was stented with a 8 mm x 40 mm Innova postdilated with a 7 mm balloon.  Widely patent stent at completion.  Preserved runoff.     Procedure:  The patient was identified in the holding area and taken to room 8.  The patient was then placed supine  on the table and prepped and draped in the usual sterile fashion.  A time out was called.  Patient received Versed and fentanyl for conscious moderate sedation.  Vital signs were monitored including heart rate, respiratory rate, oxygenation and blood pressure.  I was present for all of sedation.  Ultrasound was used to evaluate the right common femoral artery.  It was patent .  A digital ultrasound image was acquired.  A micropuncture needle was used to access the right common femoral artery under ultrasound guidance.  An 018 wire was advanced without resistance and a micropuncture sheath was placed.  The 018 wire was removed and a benson wire was placed.  The micropuncture sheath was exchanged for a 5 french sheath.  An omniflush catheter was advanced over the wire to the level of L-1.  An abdominal angiogram was obtained.  Next using a Glidewire advantage the aortic bifurcation was crossed with a Omni Flush catheter.  I got my wire down the left profunda and then upsized to a 6 French Catapault sheath in the right groin over the aortic bifurcation.  Patient was given 100 units/kg IV heparin.  We got dedicated imaging of the left external iliac high-grade stenosis.  We selected a 8 mm x 40 mm Innova that was deployed in the proximal external iliac artery and then postdilated with a 7 mm Mustang.  We preserved the hypogastric on the left that was diseased as the other contralateral hypogastric was occluded.  Widely patent stent at completion.  No residual stenosis.  No extravasation.  We then got  left lower extremity runoff with pertinent findings noted above.  This shown known long segment SFA occlusion.  A short 6 French sheath was placed in the right groin.  Will be taken holding to have the sheath removed as he has right common femoral disease.  Plan: Excellent results after left external iliac artery angioplasty and stenting today for high grade stenosis.  He has a great left femoral pulse now.  Will load on  Plavix plus aspirin statin.  Will see in 1 month.  If no improvement will plan for left SFA intervention.  Cephus Shelling, MD [facility] Office: [phone]

## 2023-03-25 NOTE — H&P (Signed)
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  The various methods of treatment have been discussed with the patient and family. After consideration of risks, benefits and other options for treatment, the patient has consented to  Procedure(s): ABDOMINAL AORTOGRAM W/LOWER EXTREMITY (N/A) as a surgical intervention.  The patient's history has been reviewed, patient examined, no change in status, stable for surgery.  I have reviewed the patient's chart and labs.  Questions were answered to the patient's satisfaction.    Angiogram with focus on left leg for lifestyle limiting claudication with possible intervention.   Cephus Shelling  Virtual Visit via Telephone Note   I connected with Jorge Jefferson on 03/01/2023 using the [email or web address] by telephone and verified that I was speaking with the correct person using two identifiers. Patient was located at home and accompanied by himself. I am located at [facility].   The limitations of evaluation and management by telemedicine and the availability of in person appointments have been previously discussed with the patient and are documented in the patients chart. The patient expressed understanding and consented to proceed.   PCP: Mirna Mires, MD   Chief Complaint: Lower extremity claudication   History of Present Illness: Jorge Jefferson is a 77 y.o. male with history hypertension, hyperlipidemia, tobacco abuse that presents for phone visit to discuss intervention on his lower extremity claudication.  He states his left leg is worse.  States he cannot walk to his carin  front of his apartment.  He describes hip and calf claudication.  He was seen by Dr. Arbie Cookey and offered angiogram with a focus on the left leg with iliac and SFA occlusive disease.  Patient just wanted to clarify the  procedure.  States he is trying to cut back on smoking.  Still smoking 1 pack/day.  No previous vascular interventions.         Past Medical History:  Diagnosis Date   Hearing loss     Hyperlipidemia     Hypertension     Renal disorder        No past surgical history on file.   Active Medications  No outpatient medications have been marked as taking for the 03/01/23 encounter (Appointment) with Cephus Shelling, MD.        12 system ROS was negative unless otherwise noted in HPI   Observations/Objective:   CTA reviewed from 01/11/2023 showing high-grade stenosis of the left external iliac artery with a long segment occlusion of the left SFA just distal to the origin.   Assessment and Plan:     77 year old male with history of hypertension, hyperlipidemia, tobacco abuse that presents for phone visit to further discuss intervention on his left lower extremity for disabling lifestyle limiting claudication.  He was seen by Dr. Arbie Cookey in [place] in March and was offered intervention with arteriography.  I answered a number of his questions and explained the procedure in detail.  Discussed intervention for claudication is elective.  Discussed that this should be delayed until he has disabling claudication and has failed medical management.  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  He has multilevel occlusive disease.  I discussed transfemoral access with lower extremity angiogram and possible intervention on the iliac and femoral segment with a focus on the  left leg.  I discussed the importance of smoking cessation.   Follow Up Instructions:    Follow up: Patient wishes to be scheduled for left leg angio with intervention   I discussed the assessment and treatment plan with the patient. The patient was provided an opportunity to ask questions and all were answered. The patient agreed with the plan and demonstrated an  understanding of the instructions.   The patient was advised to call back or seek an in-person evaluation if the symptoms worsen or if the condition fails to improve as anticipated.   I spent 11 minutes with the patient via telephone encounter.     Signed, Cephus Shelling [facility] Office: [phone]   03/01/2023, 10:23 AM

## 2023-03-25 NOTE — Progress Notes (Signed)
Patient ambulated to BR, right groin remains unremarkable.

## 2023-03-26 ENCOUNTER — Encounter (HOSPITAL_COMMUNITY): Payer: Self-pay | Admitting: Vascular Surgery

## 2023-03-26 MED FILL — Fentanyl Citrate Preservative Free (PF) Inj 100 MCG/2ML: INTRAMUSCULAR | Qty: 2 | Status: AC

## 2023-03-28 DIAGNOSIS — E441 Mild protein-calorie malnutrition: Secondary | ICD-10-CM | POA: Insufficient documentation

## 2023-04-01 ENCOUNTER — Telehealth: Payer: Self-pay

## 2023-04-01 NOTE — Telephone Encounter (Signed)
Pt had AMG a week ago and called to see if she could remove dressing. He wanted to keep it on until his 1 month f/u. I have advised pt to remove the dressing and to shower and wash with mild soap and pat dry. Pt is aware to let us know if he has any drainage, bleeding, redness, etc. He verbalized understanding of the above.

## 2023-04-08 ENCOUNTER — Other Ambulatory Visit: Payer: Self-pay | Admitting: *Deleted

## 2023-04-08 DIAGNOSIS — I739 Peripheral vascular disease, unspecified: Secondary | ICD-10-CM

## 2023-04-08 DIAGNOSIS — I70212 Atherosclerosis of native arteries of extremities with intermittent claudication, left leg: Secondary | ICD-10-CM

## 2023-04-12 DIAGNOSIS — H906 Mixed conductive and sensorineural hearing loss, bilateral: Secondary | ICD-10-CM | POA: Diagnosis not present

## 2023-04-12 DIAGNOSIS — H6993 Unspecified Eustachian tube disorder, bilateral: Secondary | ICD-10-CM | POA: Diagnosis not present

## 2023-04-13 ENCOUNTER — Ambulatory Visit (INDEPENDENT_AMBULATORY_CARE_PROVIDER_SITE_OTHER): Payer: Medicare Other | Admitting: Podiatry

## 2023-04-13 ENCOUNTER — Encounter: Payer: Self-pay | Admitting: Podiatry

## 2023-04-13 VITALS — BP 160/69 | HR 54

## 2023-04-13 DIAGNOSIS — B351 Tinea unguium: Secondary | ICD-10-CM

## 2023-04-13 DIAGNOSIS — M79676 Pain in unspecified toe(s): Secondary | ICD-10-CM

## 2023-04-13 DIAGNOSIS — E1151 Type 2 diabetes mellitus with diabetic peripheral angiopathy without gangrene: Secondary | ICD-10-CM

## 2023-04-18 ENCOUNTER — Encounter: Payer: Self-pay | Admitting: Podiatry

## 2023-04-18 NOTE — Progress Notes (Signed)
  Subjective:  Patient ID: Jorge Jefferson, male    DOB: [birth year],  MRN: [id number] Jorge Jefferson presents today for at risk foot care. Pt has h/o NIDDM with PAD and painful thick toenails that are difficult to trim. Pain interferes with ambulation. Aggravating factors include wearing enclosed shoe gear. Pain is relieved with periodic professional debridement. Chief Complaint  Patient presents with   Nail Problem    "Look at my toenails, they need to be cut." Dr. Loleta Chance - saw 1 month ago, no glucose checks   Patient confirms h/o diabetes. Also has h/o PAD with recent revascularization performed by Dr. Sherald Hess of [facility].  New problem(s): None.   PCP is Mirna Mires, MD.  No Known Allergies  Review of Systems: Negative except as noted in the HPI.  Objective: No changes noted in today's physical examination. Vitals:   04/13/23 0858  BP: (!) 160/69  Pulse: (!) 54   Jorge Jefferson is a pleasant 77 y.o. male obese in NAD. AAO x 3.  Vascular Examination: CFT <4 seconds b/l. DP pulses diminished b/l. PT pulses diminished b/l. Digital hair absent. Skin temperature gradient warm to warm b/l. No ischemia or gangrene. No cyanosis or clubbing noted b/l. Trace edema noted BLE.   Neurological Examination: Sensation grossly intact b/l with 10 gram monofilament. Vibratory sensation intact b/l.   Dermatological Examination: Pedal skin thin, shiny and atrophic b/l. No open wounds. No interdigital macerations.   Toenails 1-5 b/l thick, discolored, elongated with subungual debris and pain on dorsal palpation.   No hyperkeratotic nor porokeratotic lesions present on today's visit.  Musculoskeletal Examination: Muscle strength 5/5 to b/l LE. Hammertoe deformity noted 1-5 b/l.  Radiographs: None Assessment/Plan: 1. Pain due to onychomycosis of toenail   2. Type II diabetes mellitus with peripheral circulatory disorder [facility])   -Patient was evaluated and treated. All patient's  and/or POA's questions/concerns answered on today's visit. -Continue foot and shoe inspections daily. Monitor blood glucose per PCP/Endocrinologist's recommendations. -Patient to continue soft, supportive shoe gear daily. -Toenails 1-5 b/l were debrided in length and girth with sterile nail nippers and dremel without iatrogenic bleeding.  -Patient/POA to call should there be question/concern in the interim.   Return in about 3 months (around 07/14/2023).  Freddie Breech, DPM

## 2023-04-20 ENCOUNTER — Ambulatory Visit (HOSPITAL_COMMUNITY)
Admission: RE | Admit: 2023-04-20 | Discharge: 2023-04-20 | Disposition: A | Discharge status: Home / Self Care | Payer: Medicare Other | Source: Ambulatory Visit | Attending: Vascular Surgery | Admitting: Vascular Surgery

## 2023-04-20 ENCOUNTER — Ambulatory Visit (INDEPENDENT_AMBULATORY_CARE_PROVIDER_SITE_OTHER)
Admission: RE | Admit: 2023-04-20 | Discharge: 2023-04-20 | Disposition: A | Discharge status: Home / Self Care | Payer: Medicare Other | Source: Ambulatory Visit | Attending: Vascular Surgery | Admitting: Vascular Surgery

## 2023-04-20 DIAGNOSIS — I70212 Atherosclerosis of native arteries of extremities with intermittent claudication, left leg: Secondary | ICD-10-CM | POA: Diagnosis not present

## 2023-04-20 DIAGNOSIS — I739 Peripheral vascular disease, unspecified: Secondary | ICD-10-CM | POA: Insufficient documentation

## 2023-04-20 LAB — VAS US ABI WITH/WO TBI
Left ABI: 0.6
Right ABI: 0.7

## 2023-04-23 ENCOUNTER — Ambulatory Visit
Admission: RE | Admit: 2023-04-23 | Discharge: 2023-04-23 | Disposition: A | Discharge status: Home / Self Care | Payer: Medicare Other | Source: Ambulatory Visit | Attending: Nurse Practitioner | Admitting: Nurse Practitioner

## 2023-04-23 DIAGNOSIS — K760 Fatty (change of) liver, not elsewhere classified: Secondary | ICD-10-CM

## 2023-04-23 DIAGNOSIS — K7402 Hepatic fibrosis, advanced fibrosis: Secondary | ICD-10-CM

## 2023-04-26 NOTE — Progress Notes (Unsigned)
Patient name: Jorge Jefferson MRN: [id number] DOB: 06-16-1946 Sex: male  REASON FOR CONSULT: Follow-up after left external iliac stenting for disabling left leg claudication  HPI: Jorge Jefferson is a 77 y.o. male, the presents for follow-up after left external iliac stenting for disabling claudication.  On 03/25/2023 he underwent left external iliac stenting with an 8 x 40 Innova for a 80% stenosis.  He does have a known SFA long segment occlusion.  No intervention was performed on his SFA as Dr. Arbie Cookey recommended just iliac intervention initially.  He had aortoiliac duplex on 04/20/2023 showing velocity of 218 in the proximal stent that was otherwise patent with triphasic waveforms.  ABI had improved from 0.49-0.6.  Past Medical History:  Diagnosis Date   Hearing loss    Hyperlipidemia    Hypertension    Renal disorder     Past Surgical History:  Procedure Laterality Date   ABDOMINAL AORTOGRAM W/LOWER EXTREMITY N/A 03/25/2023   Procedure: ABDOMINAL AORTOGRAM W/LOWER EXTREMITY;  Surgeon: Cephus Shelling, MD;  Location: [facility] INVASIVE [facility];  Service: Cardiovascular;  Laterality: N/A;   PERIPHERAL VASCULAR INTERVENTION  03/25/2023   Procedure: PERIPHERAL VASCULAR INTERVENTION;  Surgeon: Cephus Shelling, MD;  Location: [facility] INVASIVE CV LAB;  Service: Cardiovascular;;    No family history on file.  SOCIAL HISTORY: Social History   Socioeconomic History   Marital status: Married    Spouse name: Not on file   Number of children: Not on file   Years of education: Not on file   Highest education level: Not on file  Occupational History   Not on file  Tobacco Use   Smoking status: Some Days    Packs/day: 0.25    Years: 30.00    Additional pack years: 0.00    Total pack years: 7.50    Types: Cigarettes   Smokeless tobacco: Never   Tobacco comments:    Up to 1 pk  daily  Vaping Use   Vaping Use: Never used  Substance and Sexual Activity   Alcohol use: Yes    Comment:  socially - 1/5 a month   Drug use: Yes    Types: Marijuana   Sexual activity: Not on file  Other Topics Concern   Not on file  Social History Narrative   ** Merged History Encounter **       Social Determinants of Health   Financial Resource Strain: Not on file  Food Insecurity: Not on file  Transportation Needs: Not on file  Physical Activity: Not on file  Stress: Not on file  Social Connections: Not on file  Intimate Partner Violence: Not on file    No Known Allergies  Current Outpatient Medications  Medication Sig Dispense Refill   amLODipine (NORVASC) 10 MG tablet Take 10 mg by mouth daily.  2   aspirin EC 81 MG tablet Take 81 mg by mouth daily. Swallow whole.     atorvastatin (LIPITOR) 10 MG tablet Take 10 mg by mouth daily.     Blood Glucose Monitoring Suppl (ONE TOUCH ULTRA 2) w/Device KIT      cholecalciferol (VITAMIN D) 25 MCG (1000 UNIT) tablet Take 1,000 Units by mouth daily.     ciprofloxacin (CILOXAN) 0.3 % ophthalmic solution 3 drops 2 (two) times daily. Place in both Ears     cloNIDine (CATAPRES) 0.1 MG tablet Take 0.1 mg by mouth daily.     clopidogrel (PLAVIX) 75 MG tablet Take 1 tablet (75 mg total)  by mouth daily. 30 tablet 11   dexamethasone (DECADRON) 0.1 % ophthalmic solution 2 drops 2 (two) times daily. Place in both Ears     Lancets (ONETOUCH DELICA PLUS LANCET33G) MISC      Magnesium 250 MG TABS Take 250 mg by mouth daily.     Multiple Vitamins-Minerals (CENTRUM ADULTS) TABS Take 1 tablet by mouth daily.     nicotine (NICODERM CQ - DOSED IN MG/24 HOURS) 21 mg/24hr patch Place 21 mg onto the skin every other day.     Omega-3 Fatty Acids (OMEGA III EPA+DHA) 1000 MG CAPS Krill oil     ONETOUCH ULTRA test strip      No current facility-administered medications for this visit.    REVIEW OF SYSTEMS:  [X]  denotes positive finding, [ ]  denotes negative finding Cardiac  Comments:  Chest pain or chest pressure: ***   Shortness of breath upon exertion:     Short of breath when lying flat:    Irregular heart rhythm:        Vascular    Pain in calf, thigh, or hip brought on by ambulation:    Pain in feet at night that wakes you up from your sleep:     Blood clot in your veins:    Leg swelling:         Pulmonary    Oxygen at home:    Productive cough:     Wheezing:         Neurologic    Sudden weakness in arms or legs:     Sudden numbness in arms or legs:     Sudden onset of difficulty speaking or slurred speech:    Temporary loss of vision in one eye:     Problems with dizziness:         Gastrointestinal    Blood in stool:     Vomited blood:         Genitourinary    Burning when urinating:     Blood in urine:        Psychiatric    Major depression:         Hematologic    Bleeding problems:    Problems with blood clotting too easily:        Skin    Rashes or ulcers:        Constitutional    Fever or chills:      PHYSICAL EXAM: There were no vitals filed for this visit.  GENERAL: The patient is a well-nourished male, in no acute distress. The vital signs are documented above. CARDIAC: There is a regular rate and rhythm.  VASCULAR: *** PULMONARY: There is good air exchange bilaterally without wheezing or rales. ABDOMEN: Soft and non-tender with normal pitched bowel sounds.  MUSCULOSKELETAL: There are no major deformities or cyanosis. NEUROLOGIC: No focal weakness or paresthesias are detected. SKIN: There are no ulcers or rashes noted. PSYCHIATRIC: The patient has a normal affect.  DATA:   ***  Assessment/Plan:  ***   Cephus Shelling, MD [facility] Office: [phone]

## 2023-04-27 ENCOUNTER — Encounter: Payer: Self-pay | Admitting: Vascular Surgery

## 2023-04-27 ENCOUNTER — Ambulatory Visit (INDEPENDENT_AMBULATORY_CARE_PROVIDER_SITE_OTHER): Payer: Medicare Other | Admitting: Vascular Surgery

## 2023-04-27 VITALS — BP 151/76 | HR 64 | Temp 98.0°F | Resp 16 | Ht >= 80 in | Wt 262.1 lb

## 2023-04-27 DIAGNOSIS — I70212 Atherosclerosis of native arteries of extremities with intermittent claudication, left leg: Secondary | ICD-10-CM

## 2023-04-27 MED ORDER — CLOPIDOGREL BISULFATE 75 MG PO TABS: 75.0000 mg | ORAL_TABLET | Freq: Every day | ORAL | 11 refills | Status: DC

## 2023-05-11 ENCOUNTER — Other Ambulatory Visit: Payer: Self-pay

## 2023-05-11 DIAGNOSIS — I739 Peripheral vascular disease, unspecified: Secondary | ICD-10-CM

## 2023-05-11 DIAGNOSIS — I70212 Atherosclerosis of native arteries of extremities with intermittent claudication, left leg: Secondary | ICD-10-CM

## 2023-06-14 DIAGNOSIS — M25512 Pain in left shoulder: Secondary | ICD-10-CM | POA: Diagnosis not present

## 2023-06-14 DIAGNOSIS — I10 Essential (primary) hypertension: Secondary | ICD-10-CM | POA: Diagnosis not present

## 2023-06-14 DIAGNOSIS — E7800 Pure hypercholesterolemia, unspecified: Secondary | ICD-10-CM | POA: Diagnosis not present

## 2023-06-14 DIAGNOSIS — E785 Hyperlipidemia, unspecified: Secondary | ICD-10-CM | POA: Diagnosis not present

## 2023-06-14 DIAGNOSIS — I739 Peripheral vascular disease, unspecified: Secondary | ICD-10-CM | POA: Diagnosis not present

## 2023-06-14 DIAGNOSIS — N182 Chronic kidney disease, stage 2 (mild): Secondary | ICD-10-CM | POA: Diagnosis not present

## 2023-07-21 ENCOUNTER — Ambulatory Visit (INDEPENDENT_AMBULATORY_CARE_PROVIDER_SITE_OTHER): Payer: Medicare Other | Admitting: Podiatry

## 2023-07-21 ENCOUNTER — Encounter: Payer: Self-pay | Admitting: Podiatry

## 2023-07-21 DIAGNOSIS — B351 Tinea unguium: Secondary | ICD-10-CM

## 2023-07-21 DIAGNOSIS — E1151 Type 2 diabetes mellitus with diabetic peripheral angiopathy without gangrene: Secondary | ICD-10-CM | POA: Diagnosis not present

## 2023-07-21 DIAGNOSIS — M79676 Pain in unspecified toe(s): Secondary | ICD-10-CM

## 2023-07-25 NOTE — Progress Notes (Signed)
Subjective:  Patient ID: Jorge Jefferson, male    DOB: Jul 28, 1946,  MRN: [id number]  Jorge Jefferson presents to clinic today for: at risk foot care. Pt has h/o NIDDM with PAD and painful elongated mycotic toenails 1-5 bilaterally which are tender when wearing enclosed shoe gear. Pain is relieved with periodic professional debridement. His wife is present during today's visit. Patient voices no new pedal concerns. Followed by [facility] for [facility]AD. Chief Complaint  Patient presents with   RFC    RFC     PCP is Mirna Mires, MD. Jorge Jefferson 01/12/2023.  No Known Allergies  Review of Systems: Negative except as noted in the HPI.  Objective: No changes noted in today's physical examination. There were no vitals filed for this visit.  Jorge Jefferson is a pleasant 77 y.o. male in NAD. AAO x 3.  Vascular Examination: Capillary refill time <3 seconds b/l LE. Nonpalpable pedal pulses b/l LE. No pedal edema b/l. Skin temperature gradient WNL b/l. No varicosities b/l. Pedal hair absent..  Dermatological Examination: Pedal skin with normal turgor, texture and tone b/l. No open wounds. No interdigital macerations b/l. Toenails 1-5 b/l thickened, discolored, dystrophic with subungual debris. There is pain on palpation to dorsal aspect of nailplates. No corns, calluses nor porokeratotic lesions noted..  Neurological Examination: Protective sensation intact with 10 gram monofilament b/l LE. Vibratory sensation intact b/l LE.   Musculoskeletal Examination: Muscle strength 5/5 to all lower extremity muscle groups bilaterally. Hammertoe(s) noted to the 1-5 bilaterally.  Assessment/Plan: 1. Pain due to onychomycosis of toenail   2. Type II diabetes mellitus with peripheral circulatory disorder [facility])    Patient was evaluated and treated. All patient's and/or POA's questions/concerns addressed on today's visit. Mycotic toenails 1-5 debrided in length and girth without incident. Continue soft, supportive shoe  gear daily. Report any pedal injuries to medical professional. Call office if there are any quesitons/concerns. -Continue foot and shoe inspections daily. Monitor blood glucose per PCP/Endocrinologist's recommendations. -Patient/POA to call should there be question/concern in the interim.   Return in about 3 months (around 10/21/2023).  Freddie Breech, DPM

## 2023-09-22 ENCOUNTER — Other Ambulatory Visit: Payer: Self-pay | Admitting: Nurse Practitioner

## 2023-09-22 DIAGNOSIS — Z8619 Personal history of other infectious and parasitic diseases: Secondary | ICD-10-CM | POA: Diagnosis not present

## 2023-09-22 DIAGNOSIS — K760 Fatty (change of) liver, not elsewhere classified: Secondary | ICD-10-CM | POA: Diagnosis not present

## 2023-09-22 DIAGNOSIS — K7402 Hepatic fibrosis, advanced fibrosis: Secondary | ICD-10-CM

## 2023-09-23 DIAGNOSIS — H25813 Combined forms of age-related cataract, bilateral: Secondary | ICD-10-CM | POA: Diagnosis not present

## 2023-09-24 ENCOUNTER — Other Ambulatory Visit: Payer: Medicare Other

## 2023-09-28 ENCOUNTER — Ambulatory Visit
Admission: RE | Admit: 2023-09-28 | Discharge: 2023-09-28 | Disposition: A | Discharge status: Home / Self Care | Payer: Medicare Other | Source: Ambulatory Visit | Attending: Nurse Practitioner | Admitting: Nurse Practitioner

## 2023-09-28 DIAGNOSIS — K760 Fatty (change of) liver, not elsewhere classified: Secondary | ICD-10-CM

## 2023-09-28 DIAGNOSIS — K7402 Hepatic fibrosis, advanced fibrosis: Secondary | ICD-10-CM

## 2023-09-29 ENCOUNTER — Inpatient Hospital Stay
Admission: RE | Admit: 2023-09-29 | Discharge: 2023-09-29 | Discharge status: Home / Self Care | Payer: Medicare Other | Source: Ambulatory Visit | Attending: Nurse Practitioner

## 2023-09-29 DIAGNOSIS — K7400 Hepatic fibrosis, unspecified: Secondary | ICD-10-CM | POA: Diagnosis not present

## 2023-09-30 DIAGNOSIS — H52223 Regular astigmatism, bilateral: Secondary | ICD-10-CM | POA: Diagnosis not present

## 2023-09-30 DIAGNOSIS — H25813 Combined forms of age-related cataract, bilateral: Secondary | ICD-10-CM | POA: Diagnosis not present

## 2023-10-11 DIAGNOSIS — I739 Peripheral vascular disease, unspecified: Secondary | ICD-10-CM | POA: Diagnosis not present

## 2023-10-11 DIAGNOSIS — Z720 Tobacco use: Secondary | ICD-10-CM | POA: Diagnosis not present

## 2023-10-11 DIAGNOSIS — I10 Essential (primary) hypertension: Secondary | ICD-10-CM | POA: Diagnosis not present

## 2023-10-11 DIAGNOSIS — E781 Pure hyperglyceridemia: Secondary | ICD-10-CM | POA: Diagnosis not present

## 2023-10-11 DIAGNOSIS — N182 Chronic kidney disease, stage 2 (mild): Secondary | ICD-10-CM | POA: Diagnosis not present

## 2023-10-11 DIAGNOSIS — E7800 Pure hypercholesterolemia, unspecified: Secondary | ICD-10-CM | POA: Diagnosis not present

## 2023-10-11 DIAGNOSIS — I129 Hypertensive chronic kidney disease with stage 1 through stage 4 chronic kidney disease, or unspecified chronic kidney disease: Secondary | ICD-10-CM | POA: Diagnosis not present

## 2023-10-28 DIAGNOSIS — H25812 Combined forms of age-related cataract, left eye: Secondary | ICD-10-CM | POA: Diagnosis not present

## 2023-11-01 NOTE — H&P (Signed)
 Surgical History & Physical  Patient Name: Jorge Jefferson  DOB: 1946/05/01  Surgery: Cataract extraction with intraocular lens implant phacoemulsification; Left Eye Surgeon: Lynwood Hermann MD Surgery Date: 11/05/2023 Pre-Op Date: 09/30/2023  HPI: A 66 Yr. old male patient 1.  The patient is here for Cataract evaluation Ref: by Dr. Darroll. Pt. complains of difficulty when reading. The left eye is affected the most. The episode is constant. Symptoms occur when the patient is reading. Near vision is worse than distance. This is negatively affecting the patient's quality of life and the patient is unable to function adequately in life with the current level of vision. Pt. is ready to proceed with cataract surgery. HPI was performed by Lynwood Hermann .  Medical History: Cataracts  Arthritis High Blood Pressure  Review of Systems Negative Allergic/Immunologic Hypertension Cardiovascular Negative Constitutional Negative Ear, Nose, Mouth & Throat Negative Endocrine Cataracts  Eyes Negative Gastrointestinal Negative Genitourinary Negative Hemotologic/Lymphatic Negative Integumentary Arthritis Musculoskeletal Negative Neurological Negative Psychiatry Negative Respiratory  Social Current every day smoker   Medication clopidogrel  ,  amlodipine ,  clonidine HCl ,  atorvastatin   Sx/Procedures Stent put in leg from blood clot  Drug Allergies  NKDA  History & Physical: Heent: cataracts NECK: supple without bruits LUNGS: lungs clear to auscultation CV: regular rate and rhythm Abdomen: soft and non-tender  Impression & Plan: Assessment: 1.  COMBINED FORMS AGE RELATED CATARACT; Both Eyes (H25.813) 2.  ASTIGMATISM, REGULAR; Both Eyes (H52.223)  Plan: 1.  Cataract accounts for the patient's decreased vision. This visual impairment is not correctable with a tolerable change in glasses or contact lenses. Cataract surgery with an implantation of a new lens should significantly  improve the visual and functional status of the patient. Discussed all risks, benefits, alternatives, and potential complications. Discussed the procedures and recovery. Patient desires to have surgery. A-scan ordered and performed today for intra-ocular lens calculations. The surgery will be performed in order to improve vision for driving, reading, and for eye examinations. Recommend phacoemulsification with intra-ocular lens. Recommend Dextenza  for post-operative pain and inflammation. Left Eye. Dilates poorly - shugarcaine by protocol. Malyugin Ring. Omidira. Recommend Toric Lens.  2.  Recommend toric IOL OU.

## 2023-11-02 ENCOUNTER — Ambulatory Visit (HOSPITAL_COMMUNITY): Payer: Medicare Other

## 2023-11-02 ENCOUNTER — Ambulatory Visit: Payer: Medicare Other | Admitting: Vascular Surgery

## 2023-11-03 ENCOUNTER — Encounter (HOSPITAL_COMMUNITY)
Admission: RE | Admit: 2023-11-03 | Discharge: 2023-11-03 | Disposition: A | Discharge status: Home / Self Care | Payer: Medicare Other | Source: Ambulatory Visit | Attending: Ophthalmology | Admitting: Ophthalmology

## 2023-11-03 ENCOUNTER — Encounter (HOSPITAL_COMMUNITY): Payer: Self-pay

## 2023-11-03 HISTORY — DX: Type 2 diabetes mellitus without complications: E11.9

## 2023-11-05 ENCOUNTER — Ambulatory Visit (HOSPITAL_COMMUNITY): Payer: Medicare Other | Admitting: Certified Registered"

## 2023-11-05 ENCOUNTER — Ambulatory Visit (HOSPITAL_BASED_OUTPATIENT_CLINIC_OR_DEPARTMENT_OTHER): Payer: Medicare Other | Admitting: Certified Registered"

## 2023-11-05 ENCOUNTER — Encounter (HOSPITAL_COMMUNITY)
Admission: RE | Disposition: A | Discharge status: Home / Self Care | Payer: Self-pay | Source: Home / Self Care | Attending: Ophthalmology

## 2023-11-05 ENCOUNTER — Encounter (HOSPITAL_COMMUNITY): Payer: Self-pay | Admitting: Ophthalmology

## 2023-11-05 ENCOUNTER — Ambulatory Visit (HOSPITAL_COMMUNITY)
Admission: RE | Admit: 2023-11-05 | Discharge: 2023-11-05 | Disposition: A | Discharge status: Home / Self Care | Payer: Medicare Other | Attending: Ophthalmology | Admitting: Ophthalmology

## 2023-11-05 DIAGNOSIS — E1136 Type 2 diabetes mellitus with diabetic cataract: Secondary | ICD-10-CM | POA: Diagnosis not present

## 2023-11-05 DIAGNOSIS — F17200 Nicotine dependence, unspecified, uncomplicated: Secondary | ICD-10-CM | POA: Diagnosis not present

## 2023-11-05 DIAGNOSIS — E119 Type 2 diabetes mellitus without complications: Secondary | ICD-10-CM | POA: Insufficient documentation

## 2023-11-05 DIAGNOSIS — F17210 Nicotine dependence, cigarettes, uncomplicated: Secondary | ICD-10-CM

## 2023-11-05 DIAGNOSIS — I10 Essential (primary) hypertension: Secondary | ICD-10-CM

## 2023-11-05 DIAGNOSIS — H52223 Regular astigmatism, bilateral: Secondary | ICD-10-CM | POA: Diagnosis not present

## 2023-11-05 DIAGNOSIS — H25812 Combined forms of age-related cataract, left eye: Secondary | ICD-10-CM | POA: Insufficient documentation

## 2023-11-05 HISTORY — PX: CATARACT EXTRACTION W/PHACO: SHX586

## 2023-11-05 LAB — GLUCOSE, CAPILLARY: Glucose-Capillary: 103 mg/dL — ABNORMAL HIGH (ref 70–99)

## 2023-11-05 SURGERY — PHACOEMULSIFICATION, CATARACT, WITH IOL INSERTION
Anesthesia: Monitor Anesthesia Care | Site: Eye | Laterality: Left

## 2023-11-05 MED ORDER — PHENYLEPHRINE HCL 2.5 % OP SOLN
1.0000 [drp] | OPHTHALMIC | Status: AC | PRN
  Administered 2023-11-05 (×3): 1 [drp] via OPHTHALMIC

## 2023-11-05 MED ORDER — SODIUM HYALURONATE 10 MG/ML IO SOLUTION
PREFILLED_SYRINGE | INTRAOCULAR | Status: DC | PRN
  Administered 2023-11-05: .85 mL via INTRAOCULAR

## 2023-11-05 MED ORDER — MIDAZOLAM HCL 2 MG/2ML IJ SOLN
INTRAMUSCULAR | Status: DC | PRN
  Administered 2023-11-05: 1 mg via INTRAVENOUS

## 2023-11-05 MED ORDER — LIDOCAINE HCL 3.5 % OP GEL
1.0000 | Freq: Once | OPHTHALMIC | Status: AC
Start: 2023-11-05 — End: 2023-11-05
  Administered 2023-11-05: 1 via OPHTHALMIC

## 2023-11-05 MED ORDER — MOXIFLOXACIN HCL 5 MG/ML IO SOLN
INTRAOCULAR | Status: DC | PRN
  Administered 2023-11-05: .2 mL via INTRACAMERAL

## 2023-11-05 MED ORDER — BSS IO SOLN
INTRAOCULAR | Status: DC | PRN
  Administered 2023-11-05: 15 mL via INTRAOCULAR

## 2023-11-05 MED ORDER — POVIDONE-IODINE 5 % OP SOLN
OPHTHALMIC | Status: DC | PRN
  Administered 2023-11-05: 1 via OPHTHALMIC

## 2023-11-05 MED ORDER — PHENYLEPHRINE-KETOROLAC 1-0.3 % IO SOLN
INTRAOCULAR | Status: DC | PRN
  Administered 2023-11-05: 500 mL via OPHTHALMIC

## 2023-11-05 MED ORDER — TETRACAINE HCL 0.5 % OP SOLN
1.0000 [drp] | OPHTHALMIC | Status: AC | PRN
  Administered 2023-11-05 (×3): 1 [drp] via OPHTHALMIC

## 2023-11-05 MED ORDER — STERILE WATER FOR IRRIGATION IR SOLN
Status: DC | PRN
  Administered 2023-11-05: 250 mL

## 2023-11-05 MED ORDER — LIDOCAINE HCL (PF) 1 % IJ SOLN
INTRAOCULAR | Status: DC | PRN
  Administered 2023-11-05: 1 mL via OPHTHALMIC

## 2023-11-05 MED ORDER — SODIUM CHLORIDE 0.9% FLUSH
INTRAVENOUS | Status: DC | PRN
  Administered 2023-11-05: 7 mL via INTRAVENOUS

## 2023-11-05 MED ORDER — MIDAZOLAM HCL 2 MG/2ML IJ SOLN
INTRAMUSCULAR | Status: AC
  Filled 2023-11-05: qty 2

## 2023-11-05 MED ORDER — SODIUM HYALURONATE 23MG/ML IO SOSY
PREFILLED_SYRINGE | INTRAOCULAR | Status: DC | PRN
  Administered 2023-11-05: .6 mL via INTRAOCULAR

## 2023-11-05 MED ORDER — TROPICAMIDE 1 % OP SOLN
1.0000 [drp] | OPHTHALMIC | Status: AC | PRN
  Administered 2023-11-05 (×3): 1 [drp] via OPHTHALMIC

## 2023-11-05 SURGICAL SUPPLY — 12 items
CATARACT SUITE SIGHTPATH (MISCELLANEOUS) ×1
CLOTH BEACON ORANGE TIMEOUT ST (SAFETY) ×1 IMPLANT
EYE SHIELD UNIVERSAL CLEAR (GAUZE/BANDAGES/DRESSINGS) IMPLANT
FEE CATARACT SUITE SIGHTPATH (MISCELLANEOUS) ×1 IMPLANT
GLOVE BIOGEL PI IND STRL 7.0 (GLOVE) ×2 IMPLANT
LENS IOL TECNIS EYHANCE 19.5 (Intraocular Lens) IMPLANT
NDL HYPO 18GX1.5 BLUNT FILL (NEEDLE) ×1 IMPLANT
NEEDLE HYPO 18GX1.5 BLUNT FILL (NEEDLE) ×1
PAD ARMBOARD 7.5X6 YLW CONV (MISCELLANEOUS) ×1 IMPLANT
SYR TB 1ML LL NO SAFETY (SYRINGE) ×1 IMPLANT
TAPE SURG TRANSPORE 1 IN (GAUZE/BANDAGES/DRESSINGS) IMPLANT
WATER STERILE IRR 250ML POUR (IV SOLUTION) ×1 IMPLANT

## 2023-11-05 NOTE — Discharge Instructions (Addendum)
Please discharge patient when stable, will follow up today with Dr. Carnella Fryman at the [facility] [place] office immediately following discharge.  Leave shield in place until visit.  All paperwork with discharge instructions will be given at the office.  [facility] Address:  [address]  [place], [place] [address]  

## 2023-11-05 NOTE — Anesthesia Procedure Notes (Signed)
Procedure Name: MAC Date/Time: 11/05/2023 10:44 AM  Performed by: Julian Reil, CRNAPre-anesthesia Checklist: Patient identified, Emergency Drugs available, Suction available and Patient being monitored Patient Re-evaluated:Patient Re-evaluated prior to induction Oxygen Delivery Method: Nasal cannula Placement Confirmation: positive ETCO2

## 2023-11-05 NOTE — Anesthesia Preprocedure Evaluation (Addendum)
Anesthesia Evaluation  Patient identified by MRN, date of birth, ID band Patient awake    Reviewed: Allergy & Precautions, H&P , NPO status , Patient's Chart, lab work & pertinent test results, reviewed documented beta blocker date and time   Airway Mallampati: II  TM Distance: >3 FB Neck ROM: full    Dental  (+) Upper Dentures, Lower Dentures   Pulmonary Current Smoker   Pulmonary exam normal breath sounds clear to auscultation       Cardiovascular Exercise Tolerance: Good hypertension,  Rhythm:regular Rate:Normal     Neuro/Psych negative neurological ROS  negative psych ROS   GI/Hepatic negative GI ROS, Neg liver ROS,,,  Endo/Other  diabetes, Type 2    Renal/GU Renal disease  negative genitourinary   Musculoskeletal   Abdominal Normal abdominal exam  (+)   Peds  Hematology negative hematology ROS (+)   Anesthesia Other Findings   Reproductive/Obstetrics negative OB ROS                             Anesthesia Physical Anesthesia Plan  ASA: 3  Anesthesia Plan: MAC   Post-op Pain Management: Minimal or no pain anticipated   Induction:   PONV Risk Score and Plan:   Airway Management Planned: Nasal Cannula and Natural Airway  Additional Equipment: None  Intra-op Plan:   Post-operative Plan:   Informed Consent: I have reviewed the patients History and Physical, chart, labs and discussed the procedure including the risks, benefits and alternatives for the proposed anesthesia with the patient or authorized representative who has indicated his/her understanding and acceptance.     Dental Advisory Given  Plan Discussed with: CRNA  Anesthesia Plan Comments:         Anesthesia Quick Evaluation

## 2023-11-05 NOTE — Op Note (Signed)
Date of procedure: 11/05/23  Pre-operative diagnosis: Visually significant age-related combined cataract, Left Eye (H25.812)  Post-operative diagnosis: Visually significant age-related combined cataract, Left Eye (H25.812)  Procedure: Removal of cataract via phacoemulsification and insertion of intra-ocular lens Laural Benes and Johnson DIB00 +19.5D into the capsular bag of the Left Eye  Attending surgeon: Rudy Jew. Camerin Ladouceur, MD, MA  Anesthesia: MAC, Topical Akten  Complications: None  Estimated Blood Loss: <29mL (minimal)  Specimens: None  Implants: As above  Indications:  Visually significant age-related cataract, Left Eye  Procedure:  The patient was seen and identified in the pre-operative area. The operative eye was identified and dilated.  The operative eye was marked.  Topical anesthesia was administered to the operative eye.     The patient was then to the operative suite and placed in the supine position.  A timeout was performed confirming the patient, procedure to be performed, and all other relevant information.   The patient's face was prepped and draped in the usual fashion for intra-ocular surgery.  A lid speculum was placed into the operative eye and the surgical microscope moved into place and focused.  An inferotemporal paracentesis was created using a 20 gauge paracentesis blade.  Shugarcaine was injected into the anterior chamber.  Viscoelastic was injected into the anterior chamber.  A temporal clear-corneal main wound incision was created using a 2.20mm microkeratome.  A continuous curvilinear capsulorrhexis was initiated using an irrigating cystitome and completed using capsulorrhexis forceps.  Hydrodissection and hydrodeliniation were performed.  Viscoelastic was injected into the anterior chamber.  A phacoemulsification handpiece and a chopper as a second instrument were used to remove the nucleus and epinucleus. The irrigation/aspiration handpiece was used to remove any  remaining cortical material.   The capsular bag was reinflated with viscoelastic, checked, and found to be intact.  The intraocular lens was inserted into the capsular bag.  The irrigation/aspiration handpiece was used to remove any remaining viscoelastic.  The clear corneal wound and paracentesis wounds were then hydrated and checked with Weck-Cels to be watertight. 0.49mL of Moxfloxacin was injected into the anterior chamber. The lid-speculum was removed.  The drape was removed.  The patient's face was cleaned with a wet and dry 4x4.    A clear shield was taped over the eye. The patient was taken to the post-operative care unit in good condition, having tolerated the procedure well.  Post-Op Instructions: The patient will follow up at [facility] for a same day post-operative evaluation and will receive all other orders and instructions.

## 2023-11-05 NOTE — Transfer of Care (Signed)
Immediate Anesthesia Transfer of Care Note  Patient: Jorge Jefferson  Procedure(s) Performed: CATARACT EXTRACTION PHACO AND INTRAOCULAR LENS PLACEMENT (IOC) (Left: Eye)  Patient Location: Short Stay  Anesthesia Type:MAC  Level of Consciousness: awake, alert , and oriented  Airway & Oxygen Therapy: Patient Spontanous Breathing  Post-op Assessment: Report given to RN and Post -op Vital signs reviewed and stable  Post vital signs: Reviewed and stable  Last Vitals:  Vitals Value Taken Time  BP    Temp    Pulse    Resp    SpO2      Last Pain:  Vitals:   11/05/23 0943  TempSrc: Oral  PainSc: 0-No pain         Complications: No notable events documented.

## 2023-11-05 NOTE — Interval H&P Note (Signed)
History and Physical Interval Note:  11/05/2023 10:39 AM  Jorge Jefferson  has presented today for surgery, with the diagnosis of combined forms age related cataract, left eye.  The various methods of treatment have been discussed with the patient and family. After consideration of risks, benefits and other options for treatment, the patient has consented to  Procedure(s): CATARACT EXTRACTION PHACO AND INTRAOCULAR LENS PLACEMENT (IOC) (Left) as a surgical intervention.  The patient's history has been reviewed, patient examined, no change in status, stable for surgery.  I have reviewed the patient's chart and labs.  Questions were answered to the patient's satisfaction.     Fabio Pierce

## 2023-11-05 NOTE — Anesthesia Postprocedure Evaluation (Signed)
Anesthesia Post Note  Patient: Jorge Jefferson  Procedure(s) Performed: CATARACT EXTRACTION PHACO AND INTRAOCULAR LENS PLACEMENT (IOC) (Left: Eye)  Patient location during evaluation: PACU Anesthesia Type: MAC Level of consciousness: awake and alert Pain management: pain level controlled Vital Signs Assessment: post-procedure vital signs reviewed and stable Respiratory status: spontaneous breathing, nonlabored ventilation, respiratory function stable and patient connected to nasal cannula oxygen Cardiovascular status: stable and blood pressure returned to baseline Postop Assessment: no apparent nausea or vomiting Anesthetic complications: no   There were no known notable events for this encounter.   Last Vitals:  Vitals:   11/05/23 0943 11/05/23 1104  BP: (!) 169/67 (!) 163/71  Pulse: (!) 53 (!) 55  Resp: 16 16  Temp: 36.4 C (!) 36.4 C  SpO2: 100% 100%    Last Pain:  Vitals:   11/05/23 1104  TempSrc: Oral  PainSc:                  [name]

## 2023-11-08 ENCOUNTER — Encounter (HOSPITAL_COMMUNITY): Payer: Self-pay | Admitting: Ophthalmology

## 2023-11-09 ENCOUNTER — Ambulatory Visit (INDEPENDENT_AMBULATORY_CARE_PROVIDER_SITE_OTHER): Payer: Medicare Other | Admitting: Podiatry

## 2023-11-09 ENCOUNTER — Encounter: Payer: Self-pay | Admitting: Podiatry

## 2023-11-09 DIAGNOSIS — M79676 Pain in unspecified toe(s): Secondary | ICD-10-CM

## 2023-11-09 DIAGNOSIS — B351 Tinea unguium: Secondary | ICD-10-CM

## 2023-11-09 DIAGNOSIS — E1151 Type 2 diabetes mellitus with diabetic peripheral angiopathy without gangrene: Secondary | ICD-10-CM | POA: Diagnosis not present

## 2023-11-11 DIAGNOSIS — H25811 Combined forms of age-related cataract, right eye: Secondary | ICD-10-CM | POA: Diagnosis not present

## 2023-11-14 NOTE — Progress Notes (Signed)
  Subjective:  Patient ID: Jorge Jefferson, male    DOB: [birth year],  MRN: [id number]  78 y.o. male presents with at risk foot care. Pt has h/o NIDDM with PAD and painful, elongated thickened toenails x 10 which are symptomatic when wearing enclosed shoe gear. This interferes with his/her daily activities. Chief Complaint  Patient presents with   Diabetes    Patient states he does not remember his last A1c , Patient last saw PCP a month and half ago, patient PCP name is [employer]    .    PCP: Mirna Mires, MD.  New problem(s): None.   Review of Systems: Negative except as noted in the HPI.   No Known Allergies  Objective:  There were no vitals filed for this visit. Constitutional Patient is a pleasant 78 y.o. African American male in NAD. AAO x 3.  Vascular Capillary fill time to digits <4 seconds.  DP/PT pulse(s) are nonpalpable b/l lower extremities. Pedal hair absent b/l. Lower extremity skin temperature gradient warm to cool b/l. No pain with calf compression b/l. No cyanosis or clubbing noted. No ischemia nor gangrene noted b/l.   Neurologic Protective sensation intact 5/5 intact bilaterally with 10g monofilament b/l.  Dermatologic Pedal skin is thin, shiny and atrophic b/l.  No open wounds b/l lower extremities. No interdigital macerations b/l lower extremities. Toenails 1-5 b/l elongated, discolored, dystrophic, thickened, crumbly with subungual debris and tenderness to dorsal palpation. No hyperkeratotic nor porokeratotic lesions present on today's visit.  Orthopedic: Normal muscle strength 5/5 to all lower extremity muscle groups bilaterally. Hammertoe deformity noted 1-5 b/l.   Last HgA1c:      No data to display           Assessment:   1. Pain due to onychomycosis of toenail   2. Type II diabetes mellitus with peripheral circulatory disorder [facility])    Plan:  Patient was evaluated and treated and all questions answered. Consent given for treatment as described  below: -Continue foot and shoe inspections daily. Monitor blood glucose per PCP/Endocrinologist's recommendations. -Toenails 1-5 b/l were debrided in length and girth with sterile nail nippers and dremel without iatrogenic bleeding.  -Patient/POA to call should there be question/concern in the interim.  Return in about 3 months (around 02/07/2024).  Freddie Breech, DPM      [facility] LOCATION: [address] [place] [address]                   Office [phone]   [facility] LOCATION: [address] [place], [place] [address] Office [phone] Freddie Breech, DPM

## 2023-11-15 ENCOUNTER — Encounter (HOSPITAL_COMMUNITY)
Admission: RE | Admit: 2023-11-15 | Discharge: 2023-11-15 | Disposition: A | Discharge status: Home / Self Care | Payer: Medicare Other | Source: Ambulatory Visit | Attending: Ophthalmology | Admitting: Ophthalmology

## 2023-11-15 ENCOUNTER — Encounter (HOSPITAL_COMMUNITY): Payer: Self-pay

## 2023-11-18 NOTE — H&P (Signed)
Surgical History & Physical  Patient Name: Jorge Jefferson  DOB: 04-01-1946  Surgery: Cataract extraction with intraocular lens implant phacoemulsification; Right Eye Surgeon: Jorge Pierce MD Surgery Date: 11/19/2023 Pre-Op Date: 11/11/2023  HPI: A 23 Yr. old male patient present for 6 day post op OS. Patient is very happy with results after surgery. Using BID OS. Patient also here for blurry vision in the right eye. Difficulties reading fine print, seeing road signs, and watching TV due to the blurred vision OD. This is negatively affecting the patient's quality of life and the patient is unable to function adequately in life with the current level of vision. Patient would like to proceed with cataract sx OD. HPI Completed by Dr. Fabio Jefferson  Medical History: Cataracts  Arthritis High Blood Pressure  Review of Systems Cardiovascular High Blood Pressure Musculoskeletal arthritis pains All recorded systems are negative except as noted above.  Social Current every day smoker  Medication Prednisolone-moxiflox-bromfen,  clopidogrel ,  amlodipine ,  clonidine HCl ,  atorvastatin   Sx/Procedures Phaco c IOL OS,  Stent put in leg from blood clot  Drug Allergies  NKDA  History & Physical: Heent: cataract NECK: supple without bruits LUNGS: lungs clear to auscultation CV: regular rate and rhythm Abdomen: soft and non-tender  Impression & Plan: Assessment: 1.  CATARACT EXTRACTION STATUS; Left Eye (Z98.42) 2.  COMBINED FORMS AGE RELATED CATARACT; Both Eyes (H25.813)  Plan: 1.  1 week after cataract surgery. Doing well with improved vision and normal eye pressure. Call with any problems or concerns. Continue Pred-Moxi-Brom 2x/day for 3 more weeks.  2.  Dilates poorly - shugarcaine by protocol. Malyugin Ring. Omidira. Cataract accounts for the patient's decreased vision. This visual impairment is not correctable with a tolerable change in glasses or contact lenses. Cataract  surgery with an implantation of a new lens should significantly improve the visual and functional status of the patient. Discussed all risks, benefits, alternatives, and potential complications. Discussed the procedures and recovery. Patient desires to have surgery. A-scan ordered and performed today for intra-ocular lens calculations. The surgery will be performed in order to improve vision for driving, reading, and for eye examinations. Recommend phacoemulsification with intra-ocular lens. Recommend Dextenza for post-operative pain and inflammation. Right Eye. Surgery required to correct imbalance of vision.

## 2023-11-19 ENCOUNTER — Ambulatory Visit (HOSPITAL_COMMUNITY): Payer: Medicare Other | Admitting: Certified Registered Nurse Anesthetist

## 2023-11-19 ENCOUNTER — Other Ambulatory Visit: Payer: Self-pay

## 2023-11-19 ENCOUNTER — Ambulatory Visit (HOSPITAL_COMMUNITY)
Admission: RE | Admit: 2023-11-19 | Discharge: 2023-11-19 | Disposition: A | Discharge status: Home / Self Care | Payer: Medicare Other | Attending: Ophthalmology | Admitting: Ophthalmology

## 2023-11-19 ENCOUNTER — Encounter (HOSPITAL_COMMUNITY): Payer: Self-pay | Admitting: Ophthalmology

## 2023-11-19 ENCOUNTER — Encounter (HOSPITAL_COMMUNITY)
Admission: RE | Disposition: A | Discharge status: Home / Self Care | Payer: Self-pay | Source: Home / Self Care | Attending: Ophthalmology

## 2023-11-19 DIAGNOSIS — E119 Type 2 diabetes mellitus without complications: Secondary | ICD-10-CM

## 2023-11-19 DIAGNOSIS — H25811 Combined forms of age-related cataract, right eye: Secondary | ICD-10-CM

## 2023-11-19 DIAGNOSIS — F17210 Nicotine dependence, cigarettes, uncomplicated: Secondary | ICD-10-CM | POA: Diagnosis not present

## 2023-11-19 DIAGNOSIS — I10 Essential (primary) hypertension: Secondary | ICD-10-CM

## 2023-11-19 DIAGNOSIS — Z9842 Cataract extraction status, left eye: Secondary | ICD-10-CM | POA: Diagnosis not present

## 2023-11-19 DIAGNOSIS — F17200 Nicotine dependence, unspecified, uncomplicated: Secondary | ICD-10-CM | POA: Insufficient documentation

## 2023-11-19 DIAGNOSIS — E1136 Type 2 diabetes mellitus with diabetic cataract: Secondary | ICD-10-CM | POA: Diagnosis not present

## 2023-11-19 HISTORY — PX: CATARACT EXTRACTION W/PHACO: SHX586

## 2023-11-19 LAB — GLUCOSE, CAPILLARY: Glucose-Capillary: 100 mg/dL — ABNORMAL HIGH (ref 70–99)

## 2023-11-19 SURGERY — PHACOEMULSIFICATION, CATARACT, WITH IOL INSERTION
Anesthesia: Monitor Anesthesia Care | Site: Eye | Laterality: Right

## 2023-11-19 MED ORDER — POVIDONE-IODINE 5 % OP SOLN
OPHTHALMIC | Status: DC | PRN
  Administered 2023-11-19: 1 via OPHTHALMIC

## 2023-11-19 MED ORDER — SODIUM HYALURONATE 23MG/ML IO SOSY
PREFILLED_SYRINGE | INTRAOCULAR | Status: DC | PRN
  Administered 2023-11-19: .6 mL via INTRAOCULAR

## 2023-11-19 MED ORDER — MIDAZOLAM HCL 2 MG/2ML IJ SOLN
INTRAMUSCULAR | Status: AC
  Filled 2023-11-19: qty 2

## 2023-11-19 MED ORDER — SODIUM CHLORIDE 0.9% FLUSH
3.0000 mL | Freq: Two times a day (BID) | INTRAVENOUS | Status: DC
  Administered 2023-11-19: 10 mL via INTRAVENOUS

## 2023-11-19 MED ORDER — STERILE WATER FOR IRRIGATION IR SOLN
Status: DC | PRN
  Administered 2023-11-19: 25 mL

## 2023-11-19 MED ORDER — MIDAZOLAM HCL 2 MG/2ML IJ SOLN
INTRAMUSCULAR | Status: DC | PRN
  Administered 2023-11-19: 1 mg via INTRAVENOUS

## 2023-11-19 MED ORDER — TROPICAMIDE 1 % OP SOLN
1.0000 [drp] | OPHTHALMIC | Status: AC | PRN
Start: 2023-11-19 — End: 2023-11-19
  Administered 2023-11-19 (×3): 1 [drp] via OPHTHALMIC

## 2023-11-19 MED ORDER — LIDOCAINE HCL (PF) 1 % IJ SOLN
INTRAOCULAR | Status: DC | PRN
  Administered 2023-11-19: 1 mL via OPHTHALMIC

## 2023-11-19 MED ORDER — TETRACAINE HCL 0.5 % OP SOLN
1.0000 [drp] | OPHTHALMIC | Status: AC | PRN
Start: 2023-11-19 — End: 2023-11-19
  Administered 2023-11-19 (×3): 1 [drp] via OPHTHALMIC

## 2023-11-19 MED ORDER — PHENYLEPHRINE-KETOROLAC 1-0.3 % IO SOLN
INTRAOCULAR | Status: DC | PRN
  Administered 2023-11-19: 500 mL via OPHTHALMIC

## 2023-11-19 MED ORDER — LIDOCAINE HCL 3.5 % OP GEL
1.0000 | Freq: Once | OPHTHALMIC | Status: AC
  Administered 2023-11-19: 1 via OPHTHALMIC

## 2023-11-19 MED ORDER — FENTANYL CITRATE (PF) 100 MCG/2ML IJ SOLN
INTRAMUSCULAR | Status: DC | PRN
  Administered 2023-11-19: 50 ug via INTRAVENOUS

## 2023-11-19 MED ORDER — BSS IO SOLN
INTRAOCULAR | Status: DC | PRN
  Administered 2023-11-19: 15 mL via INTRAOCULAR

## 2023-11-19 MED ORDER — PHENYLEPHRINE HCL 2.5 % OP SOLN
1.0000 [drp] | OPHTHALMIC | Status: AC | PRN
Start: 2023-11-19 — End: 2023-11-19
  Administered 2023-11-19 (×3): 1 [drp] via OPHTHALMIC

## 2023-11-19 MED ORDER — SODIUM HYALURONATE 10 MG/ML IO SOLUTION
PREFILLED_SYRINGE | INTRAOCULAR | Status: DC | PRN
  Administered 2023-11-19: .85 mL via INTRAOCULAR

## 2023-11-19 MED ORDER — SODIUM CHLORIDE 0.9% FLUSH
3.0000 mL | INTRAVENOUS | Status: DC | PRN
Start: 2023-11-19 — End: 2023-11-19
  Administered 2023-11-19: 10 mL via INTRAVENOUS

## 2023-11-19 MED ORDER — MOXIFLOXACIN HCL 5 MG/ML IO SOLN
INTRAOCULAR | Status: DC | PRN
  Administered 2023-11-19: .3 mL via INTRACAMERAL

## 2023-11-19 MED ORDER — FENTANYL CITRATE (PF) 100 MCG/2ML IJ SOLN
INTRAMUSCULAR | Status: AC
  Filled 2023-11-19: qty 2

## 2023-11-19 SURGICAL SUPPLY — 13 items
CATARACT SUITE SIGHTPATH (MISCELLANEOUS) ×1
CLOTH BEACON ORANGE TIMEOUT ST (SAFETY) ×1 IMPLANT
EYE SHIELD UNIVERSAL CLEAR (GAUZE/BANDAGES/DRESSINGS) IMPLANT
FEE CATARACT SUITE SIGHTPATH (MISCELLANEOUS) ×1 IMPLANT
GLOVE BIOGEL PI IND STRL 6.5 (GLOVE) IMPLANT
GLOVE BIOGEL PI IND STRL 7.0 (GLOVE) ×2 IMPLANT
LENS IOL TECNIS EYHANCE 24.0 (Intraocular Lens) IMPLANT
NDL HYPO 18GX1.5 BLUNT FILL (NEEDLE) ×1 IMPLANT
NEEDLE HYPO 18GX1.5 BLUNT FILL (NEEDLE) ×1
PAD ARMBOARD 7.5X6 YLW CONV (MISCELLANEOUS) ×1 IMPLANT
SYR TB 1ML LL NO SAFETY (SYRINGE) ×1 IMPLANT
TAPE SURG TRANSPORE 1 IN (GAUZE/BANDAGES/DRESSINGS) IMPLANT
WATER STERILE IRR 250ML POUR (IV SOLUTION) ×1 IMPLANT

## 2023-11-19 NOTE — Interval H&P Note (Signed)
History and Physical Interval Note:  11/19/2023 9:15 AM  [name]  has presented today for surgery, with the diagnosis of combined forms age related cataract, right eye.  The various methods of treatment have been discussed with the patient and family. After consideration of risks, benefits and other options for treatment, the patient has consented to  Procedure(s) with comments: CATARACT EXTRACTION PHACO AND INTRAOCULAR LENS PLACEMENT (IOC) (Right) - CDE: as a surgical intervention.  The patient's history has been reviewed, patient examined, no change in status, stable for surgery.  I have reviewed the patient's chart and labs.  Questions were answered to the patient's satisfaction.     Jorge Jefferson

## 2023-11-19 NOTE — Anesthesia Postprocedure Evaluation (Signed)
Anesthesia Post Note  Patient: Shaheer Bonfield Ayotte  Procedure(s) Performed: CATARACT EXTRACTION PHACO AND INTRAOCULAR LENS PLACEMENT (IOC) (Right: Eye)  Patient location during evaluation: Phase II Anesthesia Type: MAC Level of consciousness: awake Pain management: pain level controlled Vital Signs Assessment: post-procedure vital signs reviewed and stable Respiratory status: spontaneous breathing and respiratory function stable Cardiovascular status: blood pressure returned to baseline and stable Postop Assessment: no headache and no apparent nausea or vomiting Anesthetic complications: no Comments: Late entry   No notable events documented.   Last Vitals:  Vitals:   11/19/23 0845 11/19/23 0945  BP: (!) 162/68   Pulse: (!) 55 (!) 58  Resp: 10 16  Temp: 36.6 C 36.5 C  SpO2: 100% 99%    Last Pain:  Vitals:   11/19/23 0945  TempSrc: Oral  PainSc: 0-No pain                 Windell Norfolk

## 2023-11-19 NOTE — Op Note (Signed)
Date of procedure: 11/19/23  Pre-operative diagnosis:  Visually significant combined form age-related cataract, Right Eye (H25.811)  Post-operative diagnosis:  Visually significant combined form age-related cataract, Right Eye (H25.811)  Procedure: Removal of cataract via phacoemulsification and insertion of intra-ocular lens Johnson and Johnson DIB00 +24.0D into the capsular bag of the Right Eye  Attending surgeon: Rudy Jew. Rasul Decola, MD, MA  Anesthesia: MAC, Topical Akten  Complications: None  Estimated Blood Loss: <34mL (minimal)  Specimens: None  Implants: As above  Indications:  Visually significant age-related cataract, Right Eye  Procedure:  The patient was seen and identified in the pre-operative area. The operative eye was identified and dilated.  The operative eye was marked.  Topical anesthesia was administered to the operative eye.     The patient was then to the operative suite and placed in the supine position.  A timeout was performed confirming the patient, procedure to be performed, and all other relevant information.   The patient's face was prepped and draped in the usual fashion for intra-ocular surgery.  A lid speculum was placed into the operative eye and the surgical microscope moved into place and focused.  A superotemporal paracentesis was created using a 20 gauge paracentesis blade.  Shugarcaine was injected into the anterior chamber.  Viscoelastic was injected into the anterior chamber.  A temporal clear-corneal main wound incision was created using a 2.68mm microkeratome.  A continuous curvilinear capsulorrhexis was initiated using an irrigating cystitome and completed using capsulorrhexis forceps.  Hydrodissection and hydrodeliniation were performed.  Viscoelastic was injected into the anterior chamber.  A phacoemulsification handpiece and a chopper as a second instrument were used to remove the nucleus and epinucleus. The irrigation/aspiration handpiece was used to  remove any remaining cortical material.   The capsular bag was reinflated with viscoelastic, checked, and found to be intact.  The intraocular lens was inserted into the capsular bag.  The irrigation/aspiration handpiece was used to remove any remaining viscoelastic.  The clear corneal wound and paracentesis wounds were then hydrated and checked with Weck-Cels to be watertight. 0.16mL of Moxfloxacin was injected into the anterior chamber. The lid-speculum was removed.  The drape was removed.  The patient's face was cleaned with a wet and dry 4x4. A clear shield was taped over the eye. The patient was taken to the post-operative care unit in good condition, having tolerated the procedure well.  Post-Op Instructions: The patient will follow up at [facility] for a same day post-operative evaluation and will receive all other orders and instructions.

## 2023-11-19 NOTE — Discharge Instructions (Signed)
 Please discharge patient when stable, will follow up today with Dr. June Leap at the [facility] office immediately following discharge.  Leave shield in place until visit.  All paperwork with discharge instructions will be given at the office.  [facility] Address:  [address]  [place], [place] [address]

## 2023-11-19 NOTE — Transfer of Care (Signed)
Immediate Anesthesia Transfer of Care Note  Patient: Jorge Jefferson  Procedure(s) Performed: CATARACT EXTRACTION PHACO AND INTRAOCULAR LENS PLACEMENT (IOC) (Right: Eye)  Patient Location: PACU  Anesthesia Type:MAC  Level of Consciousness: awake, alert , and oriented  Airway & Oxygen Therapy: Patient Spontanous Breathing  Post-op Assessment: Report given to RN and Post -op Vital signs reviewed and stable  Post vital signs: Reviewed and stable  Last Vitals:  Vitals Value Taken Time  BP 168/90   Temp 98   Pulse 59   Resp 16   SpO2 98     Last Pain:  Vitals:   11/19/23 0845  TempSrc: Oral  PainSc: 0-No pain         Complications: No notable events documented.

## 2023-11-19 NOTE — Anesthesia Preprocedure Evaluation (Signed)
 Anesthesia Evaluation  Patient identified by MRN, date of birth, ID band Patient awake    Reviewed: Allergy & Precautions, H&P , NPO status , Patient's Chart, lab work & pertinent test results, reviewed documented beta blocker date and time   Airway Mallampati: II  TM Distance: >3 FB Neck ROM: full    Dental  (+) Upper Dentures, Lower Dentures   Pulmonary Current Smoker   Pulmonary exam normal breath sounds clear to auscultation       Cardiovascular Exercise Tolerance: Good hypertension,  Rhythm:regular Rate:Normal     Neuro/Psych negative neurological ROS  negative psych ROS   GI/Hepatic negative GI ROS, Neg liver ROS,,,  Endo/Other  diabetes, Type 2    Renal/GU Renal disease  negative genitourinary   Musculoskeletal   Abdominal Normal abdominal exam  (+)   Peds  Hematology negative hematology ROS (+)   Anesthesia Other Findings   Reproductive/Obstetrics negative OB ROS                             Anesthesia Physical Anesthesia Plan  ASA: 3  Anesthesia Plan: MAC   Post-op Pain Management: Minimal or no pain anticipated   Induction:   PONV Risk Score and Plan:   Airway Management Planned: Nasal Cannula and Natural Airway  Additional Equipment: None  Intra-op Plan:   Post-operative Plan:   Informed Consent: I have reviewed the patients History and Physical, chart, labs and discussed the procedure including the risks, benefits and alternatives for the proposed anesthesia with the patient or authorized representative who has indicated his/her understanding and acceptance.     Dental Advisory Given  Plan Discussed with: CRNA  Anesthesia Plan Comments:         Anesthesia Quick Evaluation

## 2023-11-21 ENCOUNTER — Encounter (HOSPITAL_COMMUNITY): Payer: Self-pay | Admitting: Ophthalmology

## 2024-01-10 DIAGNOSIS — Z720 Tobacco use: Secondary | ICD-10-CM | POA: Diagnosis not present

## 2024-01-10 DIAGNOSIS — I1310 Hypertensive heart and chronic kidney disease without heart failure, with stage 1 through stage 4 chronic kidney disease, or unspecified chronic kidney disease: Secondary | ICD-10-CM | POA: Diagnosis not present

## 2024-01-10 DIAGNOSIS — E7800 Pure hypercholesterolemia, unspecified: Secondary | ICD-10-CM | POA: Diagnosis not present

## 2024-01-10 DIAGNOSIS — I10 Essential (primary) hypertension: Secondary | ICD-10-CM | POA: Diagnosis not present

## 2024-01-10 DIAGNOSIS — E1122 Type 2 diabetes mellitus with diabetic chronic kidney disease: Secondary | ICD-10-CM | POA: Diagnosis not present

## 2024-01-10 DIAGNOSIS — N1831 Chronic kidney disease, stage 3a: Secondary | ICD-10-CM | POA: Diagnosis not present

## 2024-01-10 DIAGNOSIS — I739 Peripheral vascular disease, unspecified: Secondary | ICD-10-CM | POA: Diagnosis not present

## 2024-01-31 NOTE — Progress Notes (Unsigned)
 Patient name: Jorge Jefferson MRN: [id number] DOB: 04/24/1946 Sex: male  REASON FOR CONSULT: 6 month follow-up after left external iliac stenting for disabling left leg claudication  HPI: Jorge Jefferson is a 78 y.o. male, that presents for 6 month follow-up after left external iliac stenting for disabling claudication.  On 03/25/2023 he underwent left external iliac stenting with an 8 mm x 40 mm Innova for a 80% stenosis.  He does have a known SFA long segment occlusion.  No intervention was performed on his SFA as Dr. Arbie Cookey recommended just iliac intervention initially.  Last saw and stated his leg was 98% better after stenting.  States he was able to dance.  Remains on aspirin and Plavix.  Past Medical History:  Diagnosis Date   Diabetes mellitus without complication (HCC)    Hearing loss    Hyperlipidemia    Hypertension    Renal disorder     Past Surgical History:  Procedure Laterality Date   ABDOMINAL AORTOGRAM W/LOWER EXTREMITY N/A 03/25/2023   Procedure: ABDOMINAL AORTOGRAM W/LOWER EXTREMITY;  Surgeon: Cephus Shelling, MD;  [facility]: [facility];  Service: Cardiovascular;  Laterality: N/A;   CATARACT EXTRACTION W/PHACO Left 11/05/2023   Procedure: CATARACT EXTRACTION PHACO AND INTRAOCULAR LENS PLACEMENT (IOC);  Surgeon: Fabio Pierce, MD;  Location: [facility];  Service: Ophthalmology;  Laterality: Left;  CDE: 6.25   CATARACT EXTRACTION W/PHACO Right 11/19/2023   Procedure: CATARACT EXTRACTION PHACO AND INTRAOCULAR LENS PLACEMENT (IOC);  Surgeon: Fabio Pierce, MD;  Location: [facility];  Service: Ophthalmology;  Laterality: Right;  CDE: 7.22   PERIPHERAL VASCULAR INTERVENTION  03/25/2023   Procedure: PERIPHERAL VASCULAR INTERVENTION;  Surgeon: Cephus Shelling, MD;  Location: [facility];  Service: Cardiovascular;;    No family history on file.  SOCIAL HISTORY: Social History   Socioeconomic History   Marital status: Married    Spouse name: Not on file    Number of children: Not on file   Years of education: Not on file   Highest education level: Not on file  Occupational History   Not on file  Tobacco Use   Smoking status: Some Days    Current packs/day: 0.25    Average packs/day: 0.3 packs/day for 30.0 years (7.5 ttl pk-yrs)    Types: Cigarettes   Smokeless tobacco: Never   Tobacco comments:    Up to 1 pk  daily  Vaping Use   Vaping status: Never Used  Substance and Sexual Activity   Alcohol use: Yes    Comment: socially - 1/5 a month   Drug use: Yes    Types: Marijuana   Sexual activity: Not on file  Other Topics Concern   Not on file  Social History Narrative   ** Merged History Encounter **       Social Drivers of Health   Financial Resource Strain: Not on file  Food Insecurity: Low Risk  (01/25/2023)   [facility]   Hunger Vital Sign    Worried About Running Out of Food in the Last Year: Never true    Ran Out of Food in the Last Year: Never true  Transportation Needs: Not on file (01/25/2023)  Physical Activity: Not on file  Stress: Not on file  Social Connections: Not on file  Intimate Partner Violence: Not on file    No Known Allergies  Current Outpatient Medications  Medication Sig Dispense Refill   amLODipine (NORVASC) 10 MG tablet  Take 10 mg by mouth daily.  2   atorvastatin (LIPITOR) 10 MG tablet Take 10 mg by mouth daily.     Blood Glucose Monitoring Suppl (ONE TOUCH ULTRA 2) w/Device KIT      cholecalciferol (VITAMIN D) 25 MCG (1000 UNIT) tablet Take 1,000 Units by mouth daily.     ciprofloxacin (CILOXAN) 0.3 % ophthalmic solution 3 drops 2 (two) times daily. Place in both Ears     cloNIDine (CATAPRES) 0.1 MG tablet Take 0.1 mg by mouth daily.     clopidogrel (PLAVIX) 75 MG tablet Take 1 tablet (75 mg total) by mouth daily. 30 tablet 11   dexamethasone (DECADRON) 0.1 % ophthalmic solution 2 drops 2 (two) times daily. Place in both Ears     Lancets (ONETOUCH DELICA PLUS  LANCET33G) MISC      Magnesium 250 MG TABS Take 250 mg by mouth daily.     Multiple Vitamins-Minerals (CENTRUM ADULTS) TABS Take 1 tablet by mouth daily.     nicotine (NICODERM CQ - DOSED IN MG/24 HOURS) 21 mg/24hr patch Place 21 mg onto the skin every other day.     Omega-3 Fatty Acids (OMEGA III EPA+DHA) 1000 MG CAPS Krill oil     ONETOUCH ULTRA test strip      No current facility-administered medications for this visit.    REVIEW OF SYSTEMS:  [X]  denotes positive finding, [ ]  denotes negative finding Cardiac  Comments:  Chest pain or chest pressure:    Shortness of breath upon exertion:    Short of breath when lying flat:    Irregular heart rhythm:        Vascular    Pain in calf, thigh, or hip brought on by ambulation:    Pain in feet at night that wakes you up from your sleep:     Blood clot in your veins:    Leg swelling:         Pulmonary    Oxygen at home:    Productive cough:     Wheezing:         Neurologic    Sudden weakness in arms or legs:     Sudden numbness in arms or legs:     Sudden onset of difficulty speaking or slurred speech:    Temporary loss of vision in one eye:     Problems with dizziness:         Gastrointestinal    Blood in stool:     Vomited blood:         Genitourinary    Burning when urinating:     Blood in urine:        Psychiatric    Major depression:         Hematologic    Bleeding problems:    Problems with blood clotting too easily:        Skin    Rashes or ulcers:        Constitutional    Fever or chills:      PHYSICAL EXAM: There were no vitals filed for this visit.  GENERAL: The patient is a well-nourished male, in no acute distress. The vital signs are documented above. CARDIAC: There is a regular rate and rhythm.  VASCULAR:  Bilateral femoral pulses palpable No palpable pedal pulses but no tissue loss PULMONARY: No respiratory distress. ABDOMEN: Soft and non-tender. MUSCULOSKELETAL: There are no major  deformities or cyanosis. NEUROLOGIC: No focal weakness or paresthesias are detected.   DATA:  Aortoiliac duplex today (previously 218 proximal to left EIA stent)  ABI today (previously 0.6)   Assessment/Plan:  78 y.o. male, the presents for 6 month follow-up after left external iliac stenting for disabling claudication.  On 03/25/2023 he underwent left external iliac stenting with an 8 x 40 Innova for a 80% stenosis.  He does have a known SFA long segment occlusion.  No intervention was performed on his SFA as Dr. Shirley Douglas recommended just iliac intervention initially.  I did not recommend prior intervention on his known SFA occlusion as he had significant improvement with iliac stenting alone.    Young Hensen, MD [facility] Office: [phone]

## 2024-02-01 ENCOUNTER — Ambulatory Visit (INDEPENDENT_AMBULATORY_CARE_PROVIDER_SITE_OTHER)
Admission: RE | Admit: 2024-02-01 | Discharge: 2024-02-01 | Disposition: A | Discharge status: Home / Self Care | Payer: Medicare Other | Source: Ambulatory Visit | Attending: Vascular Surgery | Admitting: Vascular Surgery

## 2024-02-01 ENCOUNTER — Encounter: Payer: Self-pay | Admitting: Vascular Surgery

## 2024-02-01 ENCOUNTER — Ambulatory Visit (HOSPITAL_COMMUNITY)
Admission: RE | Admit: 2024-02-01 | Discharge: 2024-02-01 | Disposition: A | Discharge status: Home / Self Care | Payer: Medicare Other | Source: Ambulatory Visit | Attending: Vascular Surgery | Admitting: Vascular Surgery

## 2024-02-01 ENCOUNTER — Ambulatory Visit: Payer: Medicare Other | Admitting: Vascular Surgery

## 2024-02-01 VITALS — BP 156/81 | HR 58 | Temp 98.0°F | Resp 20 | Ht >= 80 in | Wt 262.3 lb

## 2024-02-01 DIAGNOSIS — I739 Peripheral vascular disease, unspecified: Secondary | ICD-10-CM | POA: Diagnosis not present

## 2024-02-01 DIAGNOSIS — I70212 Atherosclerosis of native arteries of extremities with intermittent claudication, left leg: Secondary | ICD-10-CM

## 2024-02-01 LAB — VAS US ABI WITH/WO TBI
Left ABI: 0.64
Right ABI: 0.63

## 2024-02-10 DIAGNOSIS — H7291 Unspecified perforation of tympanic membrane, right ear: Secondary | ICD-10-CM | POA: Diagnosis not present

## 2024-02-10 DIAGNOSIS — H6993 Unspecified Eustachian tube disorder, bilateral: Secondary | ICD-10-CM | POA: Diagnosis not present

## 2024-02-10 DIAGNOSIS — H906 Mixed conductive and sensorineural hearing loss, bilateral: Secondary | ICD-10-CM | POA: Diagnosis not present

## 2024-03-01 ENCOUNTER — Ambulatory Visit: Admitting: Podiatry

## 2024-03-01 ENCOUNTER — Encounter: Payer: Self-pay | Admitting: Podiatry

## 2024-03-01 DIAGNOSIS — M2041 Other hammer toe(s) (acquired), right foot: Secondary | ICD-10-CM | POA: Diagnosis not present

## 2024-03-01 DIAGNOSIS — E1151 Type 2 diabetes mellitus with diabetic peripheral angiopathy without gangrene: Secondary | ICD-10-CM | POA: Diagnosis not present

## 2024-03-01 DIAGNOSIS — E119 Type 2 diabetes mellitus without complications: Secondary | ICD-10-CM

## 2024-03-01 DIAGNOSIS — M2042 Other hammer toe(s) (acquired), left foot: Secondary | ICD-10-CM

## 2024-03-01 DIAGNOSIS — M79676 Pain in unspecified toe(s): Secondary | ICD-10-CM

## 2024-03-01 DIAGNOSIS — B351 Tinea unguium: Secondary | ICD-10-CM

## 2024-03-05 NOTE — Progress Notes (Signed)
 ANNUAL DIABETIC FOOT EXAM  Subjective: Jorge Jefferson presents today for annual diabetic foot exam.  Patient confirms h/o diabetes. He has h/o PAD and is followed by [facility].  Patient denies any h/o foot wounds.  Wilburn Handler, MD is patient's PCP. LOV 10/11/2023.  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) 02/09/2022   Constipation 11/07/2021   Screening for malignant neoplasm of colon 11/07/2021   Advanced hepatic fibrosis 09/17/2021   Elevated ferritin level 09/17/2021   Hyperlipidemia 09/17/2021   History of colonic polyps 09/17/2021   Steatosis of liver 09/17/2021   Bilateral sensorineural hearing loss 03/15/2014   Chronic eustachian tube dysfunction 03/15/2014   Essential hypertension 03/15/2014   Mixed hearing loss of right ear 03/15/2014   Nasal septal perforation 03/15/2014   Past Surgical History:  Procedure Laterality Date   ABDOMINAL AORTOGRAM W/LOWER EXTREMITY N/A 03/25/2023   Procedure: ABDOMINAL AORTOGRAM W/LOWER EXTREMITY;  Surgeon: Young Hensen, MD;  [facility];  Service: Cardiovascular;  Laterality: N/A;   CATARACT EXTRACTION W/PHACO Left 11/05/2023   Procedure: CATARACT EXTRACTION PHACO AND INTRAOCULAR LENS PLACEMENT (IOC);  Surgeon: Tarri Farm, MD;  Location: [facility];  Service: Ophthalmology;  Laterality: Left;  CDE: 6.25   CATARACT EXTRACTION W/PHACO Right 11/19/2023   Procedure:  CATARACT EXTRACTION PHACO AND INTRAOCULAR LENS PLACEMENT (IOC);  Surgeon: Tarri Farm, MD;  Location: [facility];  Service: Ophthalmology;  Laterality: Right;  CDE: 7.22   PERIPHERAL VASCULAR INTERVENTION  03/25/2023   Procedure: PERIPHERAL VASCULAR INTERVENTION;  Surgeon: Young Hensen, MD;  [facility];  Service: Cardiovascular;;   Current Outpatient Medications on File Prior to Visit  Medication Sig Dispense Refill   amLODipine (NORVASC) 10 MG tablet Take 10 mg by mouth daily.  2   atorvastatin (LIPITOR) 10 MG tablet Take 10 mg by mouth daily.     Blood Glucose Monitoring Suppl (ONE TOUCH ULTRA 2) w/Device KIT      cholecalciferol (VITAMIN D) 25 MCG (1000 UNIT) tablet Take 1,000 Units by mouth daily.     ciprofloxacin  (CILOXAN ) 0.3 % ophthalmic solution 3 drops 2 (two) times daily. Place in both Ears     cloNIDine (CATAPRES) 0.1 MG tablet Take 0.1 mg by mouth daily.     clopidogrel  (PLAVIX ) 75 MG tablet Take 1 tablet (75 mg total) by mouth daily. 30 tablet 11   dexamethasone  (DECADRON ) 0.1 % ophthalmic solution 2 drops 2 (two) times daily. Place in both Ears     Lancets (ONETOUCH DELICA PLUS LANCET33G) MISC      Magnesium 250 MG TABS Take 250 mg by mouth daily.     Multiple Vitamins-Minerals (CENTRUM ADULTS) TABS Take 1 tablet by mouth daily.     nicotine (NICODERM CQ - DOSED IN MG/24 HOURS) 21 mg/24hr patch Place 21 mg onto the skin every other day.     Omega-3 Fatty Acids (OMEGA III EPA+DHA) 1000 MG CAPS Krill oil  ONETOUCH ULTRA test strip      No current facility-administered medications on file prior to visit.    No Known Allergies Social History   Occupational History   Not on file  Tobacco Use   Smoking status: Some Days    Current packs/day: 0.25    Average packs/day: 0.3 packs/day for 30.0 years (7.5 ttl pk-yrs)    Types: Cigarettes   Smokeless tobacco: Never   Tobacco comments:    Up to 1 pk  daily  Vaping Use   Vaping status: Never Used   Substance and Sexual Activity   Alcohol use: Yes    Comment: socially - 1/5 a month   Drug use: Yes    Types: Marijuana   Sexual activity: Not on file   History reviewed. No pertinent family history. Immunization History  Administered Date(s) Administered   Fluad Quad(high Dose 65+) 06/27/2019   Influenza, High Dose Seasonal PF 07/20/2018     Review of Systems: Negative except as noted in the HPI.   Objective: There were no vitals filed for this visit.  Jorge Jefferson is a pleasant 79 y.o. male in NAD. AAO X 3.  Diabetic foot exam was performed with the following findings:   Vascular Examination: CFT <4 seconds b/l. DP pulses diminished b/l. PT pulses diminished b/l. Digital hair absent. Skin temperature gradient warm to cool b/l. No ischemia or gangrene. No cyanosis or clubbing noted b/l. No edema noted b/l LE.   Neurological Examination: Sensation grossly intact b/l with 10 gram monofilament. Vibratory sensation intact b/l.   Dermatological Examination: Pedal skin thin, shiny and atrophic b/l. No open wounds. No interdigital macerations.   Toenails 1-5 b/l thick, discolored, elongated with subungual debris and pain on dorsal palpation.   No corns, calluses nor porokeratotic lesions noted.  Musculoskeletal Examination: Muscle strength 5/5 to all lower extremity muscle groups bilaterally. Hammertoe(s) 1-5 bilaterally.  Radiographs: None     No results found for: "HGBA1C" VAS US  ABI WITH/WO TBI Result Date: 02/01/2024  LOWER EXTREMITY DOPPLER STUDY Patient Name:  Jorge Jefferson  Date of Exam:   02/01/2024 Medical Rec #: [id number]          Accession #:    [id number] Date of Birth: [birth year]         Patient Gender: M Patient Age:   54 years Exam Location:  [facility] Procedure:       VAS US  ABI WITH/WO TBI  Referring Phys: Jimmye Moulds  --------------------------------------------------------------------------------   Indications:  Claudication, and peripheral artery disease. High Risk Factors: Hypertension, hyperlipidemia, Diabetes, current smoker.  Vascular Interventions: 03/25/23 Left external iliac artery angioplasty with stent placement.   Performing Technologist: Homer Lust RVT    Examination Guidelines: A complete evaluation includes at minimum, Doppler waveform signals and systolic blood pressure reading at the level of bilateral brachial, anterior tibial, and posterior tibial arteries, when vessel segments are accessible. Bilateral testing is considered an integral part of a complete examination. Photoelectric Plethysmograph (PPG) waveforms and toe systolic pressure readings are included as required and additional duplex testing as needed. Limited examinations for reoccurring indications may be performed as noted.    ABI Findings: +---------+------------------+-----+----------+--------+  Right    Rt Pressure (mmHg)IndexWaveform  Comment  +---------+------------------+-----+----------+--------+  Brachial 136                                       +---------+------------------+-----+----------+--------+  PTA      85  0.57 monophasic         +---------+------------------+-----+----------+--------+  DP       94                0.63 monophasic         +---------+------------------+-----+----------+--------+  Great Toe75                0.50 Abnormal           +---------+------------------+-----+----------+--------+ +---------+------------------+-----+----------+-------+  Left     Lt Pressure (mmHg)IndexWaveform  Comment +---------+------------------+-----+----------+-------+  Brachial 150                                      +---------+------------------+-----+----------+-------+  PTA      96                0.64 monophasic        +---------+------------------+-----+----------+-------+  DP       84                0.56 monophasic         +---------+------------------+-----+----------+-------+  Great Toe86                0.57 Abnormal          +---------+------------------+-----+----------+-------+ +-------+-----------+-----------+------------+------------+  ABI/TBIToday's ABIToday's TBIPrevious ABIPrevious TBI +-------+-----------+-----------+------------+------------+  Right  0.63       0.50       0.70        0.50         +-------+-----------+-----------+------------+------------+  Left   0.64       0.57       0.60        0.43         +-------+-----------+-----------+------------+------------+   Bilateral ABIs appear essentially unchanged.    Summary:   Right: Resting right ankle-brachial index indicates moderate right lower extremity arterial disease. The right toe-brachial index is abnormal.   Left: Resting left ankle-brachial index indicates moderate left lower extremity arterial disease. The left toe-brachial index is abnormal.  *See table(s) above for measurements and observations.    Electronically signed by Jimmye Moulds MD on 02/01/2024 at 11:48:10 AM.      Final    ADA Risk Categorization: High Risk  Patient has one or more of the following: Loss of protective sensation Absent pedal pulses Severe Foot deformity History of foot ulcer  Assessment: 1. Pain due to onychomycosis of toenail   2. Acquired hammertoes of both feet   3. Type II diabetes mellitus with peripheral circulatory disorder (HCC)   4. Encounter for diabetic foot exam (HCC)     Plan: Diabetic foot examination performed today. All patient's and/or POA's questions/concerns addressed on today's visit. Toenails 1-5 debrided in length and girth without incident. Continue foot and shoe inspections daily. Monitor blood glucose per PCP/Endocrinologist's recommendations. Continue soft, supportive shoe gear daily. Report any pedal injuries to medical professional. Call office if there are any questions/concerns. -Patient/POA  to call should there be question/concern in the interim. Return in about 3 months (around 06/01/2024).  Luella Sager, DPM      [place] LOCATION: [address]  [place], [place] [address]                   Office [phone]   [facility] LOCATION: [address] [place], [place] [address] Office [phone]

## 2024-03-30 ENCOUNTER — Encounter: Payer: Self-pay | Admitting: Podiatry

## 2024-03-30 ENCOUNTER — Ambulatory Visit: Admitting: Podiatry

## 2024-03-30 ENCOUNTER — Ambulatory Visit (INDEPENDENT_AMBULATORY_CARE_PROVIDER_SITE_OTHER)

## 2024-03-30 DIAGNOSIS — M779 Enthesopathy, unspecified: Secondary | ICD-10-CM

## 2024-03-30 DIAGNOSIS — R2689 Other abnormalities of gait and mobility: Secondary | ICD-10-CM | POA: Diagnosis not present

## 2024-03-30 DIAGNOSIS — M2170 Unequal limb length (acquired), unspecified site: Secondary | ICD-10-CM | POA: Diagnosis not present

## 2024-03-30 NOTE — Progress Notes (Signed)
 Subjective:  Patient ID: Jorge Jefferson, male    DOB: 02/21/1946,  MRN: [id number]  Chief Complaint  Patient presents with   Foot Pain    This ankle has been bothering me for 5 years.  I wish they could figure out what's going on.  The bottom of my foot feels like it's swollen.    Discussed the use of AI scribe software for clinical note transcription with the patient, who gave verbal consent to proceed.  History of Present Illness   Jorge Jefferson is a 78 year old male who presents with chronic ankle stiffness and pain.  He has experienced chronic stiffness and pain in his ankle for approximately five years, which he attributes to renal failure diagnosed during a two-week hospital stay after traveling. The ankle is consistently stiff and heavy, with pain that sometimes prevents walking and causes imbalance. He describes a sensation of fullness in the bottom of his foot when standing, contributing to difficulty maintaining balance.  He denies any recent injuries or sprains to the ankle but recalls possible injuries from years ago. He has not been diagnosed with neuropathy and does not experience pain when the ankle is manipulated. A stent was placed in his leg about a year ago, which improved blood flow. 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 He has not been diagnosed with sciatica but acknowledges back problems. He also mentions a leg length discrepancy, with the left leg longer than the right, contributing to imbalance.  He smokes about nineteen cigarettes a day for over forty years. He does not have diabetes. He expresses difficulty walking, stating he can no longer walk as he used to enjoy.          Objective:    Physical Exam   VASCULAR: Foot is warm and well-perfused. Capillary fill time is brisk. Pulses non-palpable. DERMATOLOGIC: Normal  skin turgor, texture, and temperature. No open lesions, rashes, or ulcerations. NEUROLOGIC: Normal sensation to light touch and pressure. No paresthesias. ORTHOPEDIC: Smooth pain-free range of motion of all examined joints. No ecchymosis or bruising. No gross deformity. No pain to palpation. 1 cm limb length discrepancy, right leg shorter than left. Limited range of motion in inversion, eversion, and equinus.       No images are attached to the encounter.    Results   RADIOLOGY Left foot and ankle radiographs: Maintained ankle mortise and subtalar joint. No major arthritic changes. (03/30/2024)      Assessment:   1. Tendonitis   2. Balance disorder   3. Acquired unequal limb length      Plan:  Patient was evaluated and treated and all questions answered.  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 Symptoms include stiffness, heaviness, and imbalance, particularly in the morning. X-ray shows maintained ankle mortis and subtalar joint with no major arthritic changes. Differential includes nerve dysfunction due to back issues or circulation problems. No indication for injection or surgical intervention at this time. - Refer to physical therapy for gait training, balance training, and range of motion exercises. - Recommend use of a heel lift in the right shoe to address limb length discrepancy and improve balance.  Chronic back pain with possible nerve involvement Chronic  back pain with possible nerve involvement, potentially affecting balance and causing feelings of heaviness and stiffness in the foot. History of L4, L5, S1 issues. Consideration of nerve dysfunction contributing to symptoms. - Recommend follow-up with a back specialist for reevaluation of back issues and potential nerve involvement.  Renal failure Renal failure diagnosed during a hospital  stay. No current symptoms or treatment discussed.  Nicotine dependence Long-term nicotine dependence with a history of smoking approximately nineteen cigarettes per day for over forty years.  Goals of Care Improving mobility and balance through physical therapy and addressing limb length discrepancy. Emphasis on enhancing quality of life by enabling walking and reducing imbalance.          Return if symptoms worsen or fail to improve.

## 2024-03-30 NOTE — Patient Instructions (Signed)
  VISIT SUMMARY: Today, we discussed your chronic ankle stiffness and pain, which you have been experiencing for about five years. We also reviewed your history of back problems, renal failure, and long-term smoking. Our goal is to improve your mobility and balance, and enhance your overall quality of life.  YOUR PLAN: -CHRONIC STIFFNESS AND IMBALANCE DUE TO SUBTALAR JOINT ARTHRITIS: This condition involves early arthritis in the subtalar joint, causing stiffness, heaviness, and imbalance, especially in the morning. We will refer you to physical therapy for gait training, balance training, and range of motion exercises. Additionally, using a heel lift in your right shoe can help address the limb length discrepancy and improve your balance.  -CHRONIC BACK PAIN WITH POSSIBLE NERVE INVOLVEMENT: Your chronic back pain, particularly at the L4, L5, and S1 levels, may be affecting your balance and causing feelings of heaviness and stiffness in your foot. We recommend following up with a back specialist to reevaluate your back issues and potential nerve involvement.  -RENAL FAILURE: You were diagnosed with renal failure during a hospital stay. There are no current symptoms or treatments discussed for this condition at this time.  -NICOTINE DEPENDENCE: You have a long-term dependence on nicotine, smoking about nineteen cigarettes per day for over forty years. Reducing or quitting smoking can significantly improve your overall health.  INSTRUCTIONS: Please follow up with a back specialist for reevaluation of your back issues and potential nerve involvement. Additionally, attend physical therapy sessions as recommended for gait training, balance training, and range of motion exercises. Use a heel lift in your right shoe to help with the limb length discrepancy and improve your balance.                      Contains text generated by [employer]bridge.                                  Contains text generated by Abridge.

## 2024-04-11 DIAGNOSIS — Z720 Tobacco use: Secondary | ICD-10-CM | POA: Diagnosis not present

## 2024-04-11 DIAGNOSIS — E1122 Type 2 diabetes mellitus with diabetic chronic kidney disease: Secondary | ICD-10-CM | POA: Diagnosis not present

## 2024-04-11 DIAGNOSIS — N1831 Chronic kidney disease, stage 3a: Secondary | ICD-10-CM | POA: Diagnosis not present

## 2024-04-11 DIAGNOSIS — I739 Peripheral vascular disease, unspecified: Secondary | ICD-10-CM | POA: Diagnosis not present

## 2024-04-11 DIAGNOSIS — I129 Hypertensive chronic kidney disease with stage 1 through stage 4 chronic kidney disease, or unspecified chronic kidney disease: Secondary | ICD-10-CM | POA: Diagnosis not present

## 2024-04-11 DIAGNOSIS — E7800 Pure hypercholesterolemia, unspecified: Secondary | ICD-10-CM | POA: Diagnosis not present

## 2024-04-11 DIAGNOSIS — I10 Essential (primary) hypertension: Secondary | ICD-10-CM | POA: Diagnosis not present

## 2024-04-12 ENCOUNTER — Other Ambulatory Visit (HOSPITAL_COMMUNITY): Payer: Self-pay | Admitting: Family Medicine

## 2024-04-12 DIAGNOSIS — F17210 Nicotine dependence, cigarettes, uncomplicated: Secondary | ICD-10-CM

## 2024-04-17 DIAGNOSIS — Z961 Presence of intraocular lens: Secondary | ICD-10-CM | POA: Diagnosis not present

## 2024-04-17 DIAGNOSIS — H53021 Refractive amblyopia, right eye: Secondary | ICD-10-CM | POA: Diagnosis not present

## 2024-04-24 DIAGNOSIS — I129 Hypertensive chronic kidney disease with stage 1 through stage 4 chronic kidney disease, or unspecified chronic kidney disease: Secondary | ICD-10-CM | POA: Diagnosis not present

## 2024-04-24 DIAGNOSIS — N1831 Chronic kidney disease, stage 3a: Secondary | ICD-10-CM | POA: Diagnosis not present

## 2024-05-03 NOTE — Therapy (Unsigned)
 OUTPATIENT PHYSICAL THERAPY LOWER EXTREMITY EVALUATION   Patient Name: Jorge Jefferson MRN: [id number] DOB:November 13, 1945, 78 y.o., male Today's Date: 05/04/2024  END OF SESSION:  PT End of Session - 05/04/24 0929     Visit Number 1    Number of Visits 12    Date for PT Re-Evaluation 06/01/24    Authorization Type [facility] Medicare    Authorization Time Period auth requested    PT Start Time 0934    PT Stop Time 1014    PT Time Calculation (min) 40 min    Activity Tolerance Patient tolerated treatment well    Behavior During Therapy [facility] for tasks assessed/performed          Past Medical History:  Diagnosis Date   Diabetes mellitus without complication (HCC)    Hearing loss    Hyperlipidemia    Hypertension    Peripheral arterial disease (HCC)    Renal disorder    Past Surgical History:  Procedure Laterality Date   ABDOMINAL AORTOGRAM W/LOWER EXTREMITY N/A 03/25/2023   Procedure: ABDOMINAL AORTOGRAM W/LOWER EXTREMITY;  Surgeon: Gretta Lonni JINNY, MD;  Location: [facility];  Service: Cardiovascular;  Laterality: N/A;   CATARACT EXTRACTION W/PHACO Left 11/05/2023   Procedure: CATARACT EXTRACTION PHACO AND INTRAOCULAR LENS PLACEMENT (IOC);  Surgeon: Harrie Agent, MD;  Location: [facility];  Service: Ophthalmology;  Laterality: Left;  CDE: 6.25   CATARACT EXTRACTION W/PHACO Right 11/19/2023   Procedure: CATARACT EXTRACTION PHACO AND INTRAOCULAR LENS PLACEMENT (IOC);  Surgeon: Harrie Agent, MD;  Location: [facility];  Service: Ophthalmology;  Laterality: Right;  CDE: 7.22   PERIPHERAL VASCULAR INTERVENTION  03/25/2023   Procedure: PERIPHERAL VASCULAR INTERVENTION;  Surgeon: Gretta Lonni JINNY, MD;  Location: [facility];  Service: Cardiovascular;;   Patient Active Problem List   Diagnosis Date Noted   Mild protein-calorie malnutrition (HCC) 03/28/2023   Perforation of right tympanic membrane 03/18/2023   Atherosclerosis of native arteries of extremity with intermittent  claudication (HCC) 03/01/2023   Eustachian tube dysfunction, bilateral 01/25/2023   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) 02/09/2022   Constipation 11/07/2021   Screening for malignant neoplasm of colon 11/07/2021   Advanced hepatic fibrosis 09/17/2021   Elevated ferritin level 09/17/2021   Hyperlipidemia 09/17/2021   History of colonic polyps 09/17/2021   Steatosis of liver 09/17/2021   Bilateral sensorineural hearing loss 03/15/2014   Chronic eustachian tube dysfunction 03/15/2014   Essential hypertension 03/15/2014   Mixed hearing loss of right ear 03/15/2014   Nasal septal perforation 03/15/2014    PCP: Leigh Lung, MD  REFERRING PROVIDER: Silva Juliene SAUNDERS, DPM  REFERRING DIAG: R26.89 (ICD-10-CM) - Balance disorder 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 Patient has joint stiffness in left foot, balance issues, 1cm limb length shorter on R, would like to include ROM, strengthening, stability, and gait/balance training. Modalities PRN at therapist's discretion.  THERAPY DIAG:  Pain of both hip joints  Impaired functional mobility, balance, gait, and endurance  Leg weakness, bilateral  Rationale for Evaluation and Treatment: Rehabilitation  ONSET DATE: A few months   SUBJECTIVE:   SUBJECTIVE STATEMENT: Patient reports pain in hips when he walks and balance issues. Hip pain has been going on for a few months and it comes and goes. Reports hip pain is sharp. Reports if he walks around distance of apartment complex, it starts  to hurt. Reports balance issue has been going on for a little while longer than hip pain. Reports they put a stent in about a year ago and L leg is a little bit longer than R. Isn't sure if theres any correlation there.   PERTINENT HISTORY: PAD LE Stent placed 03/2023  PAIN:  Are you having pain? Yes:  NPRS scale: None currently. When its there, 10/10 Pain location: Both hips Pain description: sharp pain  Aggravating factors: Walking Relieving factors: Standing and moving hips around   PRECAUTIONS: None  RED FLAGS: None   WEIGHT BEARING RESTRICTIONS: No  FALLS:  Has patient fallen in last 6 months? 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  PLOF: Independent  PATIENT GOALS: To not feel this pain  NEXT MD VISIT: August 26th  OBJECTIVE:  Note: Objective measures were completed at Evaluation unless otherwise noted.  DIAGNOSTIC FINDINGS:    2023: FINDINGS: There is no evidence of hip fracture or dislocation. There is no evidence of arthropathy or other focal bone abnormality.   IMPRESSION: Negative.  PATIENT SURVEYS:  LEFS : Lower Extremity Functional Score: 48 / 80 = 60.0 %    COGNITION: Overall cognitive status: Within functional limits for tasks assessed     SENSATION: WFL  MUSCLE LENGTH: Hamstrings: Right ~110 bilat deg; Left ~110 bilat deg  -  moderate tightness during passive 90/90 test  POSTURE: rounded shoulders and forward head  PALPATION:   LOWER EXTREMITY ROM:  Active ROM Right eval Left eval  Hip flexion    Hip extension    Hip abduction    Hip adduction    Hip internal rotation    Hip external rotation    Knee flexion    Knee extension    Ankle dorsiflexion    Ankle plantarflexion    Ankle inversion    Ankle eversion     (Blank rows = not tested)  LOWER EXTREMITY MMT:  MMT Right eval Left eval  Hip flexion 4- 4-  Hip extension 3+ 3+  Hip abduction 4 4  Hip adduction    Hip internal rotation    Hip external rotation    Knee flexion    Knee extension    Ankle dorsiflexion 5 5  Ankle plantarflexion    Ankle inversion     Ankle eversion     (Blank rows = not tested)  LOWER EXTREMITY SPECIAL TESTS:  Hip special tests: Belvie (FABER) test: positive   FUNCTIONAL TESTS:  30 seconds chair stand test: 10.5 STS, no UE  4 Stage Balance: Side by side: 30, very mild swaying Semi tandem: 30 each foot leading, mild swaying Full tandem: unable to with RLE leading, L foot: 3 Single Leg stance: R foot - Unable, L foot: 3  GAIT: Distance walked: 100 Assistive device utilized: None Level of assistance: Complete Independence Comments: Mild limp at times (slight leg length discrepancy), L leg demo inc flexion throughout cycle,  TREATMENT DATE:  05/04/24: PT Eval and HEP   PATIENT EDUCATION:  Education details: PT evaluation, objective findings, POC, Importance of HEP, Precautions, Clinic policies Person educated: Patient Education method: Explanation and Demonstration Education comprehension: verbalized understanding and returned demonstration  HOME EXERCISE PROGRAM: Access Code: [email or web address] URL: [email or web address] Date: 05/04/2024 Prepared by: Rosaria Powell-Butler  Exercises - Seated Hamstring Stretch  - 2 x daily - 7 x weekly - 3 sets - 30 hold - Sit to Stand  - 2 x daily - 7 x weekly - 3 sets - 10 reps - Supine Bridge  - 2 x daily - 7 x weekly - 3 sets - 10 reps - Standing Tandem Balance with Counter Support  - 2 x daily - 7 x weekly - 3 sets - 30 hold  ASSESSMENT:  CLINICAL IMPRESSION: Patient is a 78 y.o. 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 On this date, patient demonstrates dec LE strength, impaired balance, altered gait, dec endurance, and inc pain. Hip pain is reproduced on R hip only during FABER testing. Patient also demo  moderate tightness in hamstrings bilaterally. Patient will benefit from continued skilled physical therapy in order to address the above to improve his function and quality of life.    OBJECTIVE IMPAIRMENTS: Abnormal gait, decreased activity tolerance, decreased balance, decreased coordination, decreased endurance, decreased ROM, decreased strength, improper body mechanics, postural dysfunction, and pain.   ACTIVITY LIMITATIONS: lifting, bending, sitting, standing, squatting, stairs, and transfers  PARTICIPATION LIMITATIONS: meal prep, cleaning, laundry, community activity, and yard work  PERSONAL FACTORS: N/A are also affecting patient's functional outcome.   REHAB POTENTIAL: Good  CLINICAL DECISION MAKING: Stable/uncomplicated  EVALUATION COMPLEXITY: Low   GOALS: Goals reviewed with patient? No  SHORT TERM GOALS: Target date: 05/18/24 Patient will be independent with performance of HEP to demonstrate adequate self management of symptoms.  Baseline:  Goal status: INITIAL  2.   Patient will report at least a 25% improvement with function or pain overall since beginning PT. Baseline:  Goal status: INITIAL   LONG TERM GOALS: Target date: 06/15/24  Patient will improve LEFS by at least 9 points to demo improved self perceived function while meeting MCID. Baseline:  Goal status: INITIAL  2.  Patient will improve hip ext MMT to at least 4/5 to demonstrate to improved LE strength needed for functional transfer. Baseline:  Goal status: INITIAL  3.  Patient will improve 30 second chair stand by at least 3 STS in order to demonstrate improved LE endurance needed for community ambulation. Baseline:  Goal status: INITIAL  4.  Patient will maintain tandem balance for at least 10 seconds with each LE leading to demonstrate improved overall balance to decrease fall risk.  Baseline:  Goal status: INITIAL   PLAN:  PT FREQUENCY: 2x/week  PT DURATION: 6 weeks  PLANNED  INTERVENTIONS: 97164- PT Re-evaluation, 97110-Therapeutic exercises, 97530- Therapeutic activity, 97112- Neuromuscular re-education, 97535- Self Care, [id number]- Manual therapy, [phone]- Gait training, [phone]- Electrical stimulation (manual), [id number]- Traction (mechanical), [phone] (1-2 muscles), [id number] (3+ muscles)- Dry Needling, Patient/Family education, Balance training, Stair training, Taping, Joint mobilization, Spinal mobilization, Cryotherapy, and Moist heat  PLAN FOR NEXT SESSION: Review of goals and HEP, ABC Scale, DGI, LE strengthening, core strengthening, progress hip mobility   3:44 PM, 05/04/24 Choua Chalker Powell-Butler, PT, DPT [facility] [place]   [facility] Medicare Auth Request Information Treatment Start Date: 05/04/24  Date of referral: 03/30/24 Referring provider: Silva Juliene SAUNDERS, DPM Referring diagnosis (ICD 10)? 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 (if different than referring diagnosis)  M25.551, M25.552 Z74.09 R29.898  What was this (referring dx) caused by? Ongoing Issue  Lysle of Condition: Chronic (continuous duration > 3 months)   Laterality: Both  Current Functional Measure Score: LEFS: 48 / 80 = 60.0 %  Objective measurements identify impairments when they are compared to normal values, the uninvolved extremity, and prior level of function.  [x]  Yes  []  No  Objective assessment of functional ability: Moderate functional limitations   Briefly describe symptoms: Pain in hips bilaterally, balance dysfunction, muscle tightness, inc fall risk, mild leg length discrepancy  How did symptoms start: insidious, over time  Average pain intensity:  Last 24 hours: 10/10  Past week: 10/10  How often does the pt experience symptoms? Frequently  How much have the symptoms interfered with usual daily activities? Moderately  How has condition changed since care began at this  facility? NA - initial visit  In general, how is the patients overall health? Fair   BACK PAIN (STarT Back Screening Tool) No

## 2024-05-04 ENCOUNTER — Encounter (HOSPITAL_COMMUNITY): Payer: Self-pay

## 2024-05-04 ENCOUNTER — Ambulatory Visit (HOSPITAL_COMMUNITY): Attending: Podiatry

## 2024-05-04 ENCOUNTER — Other Ambulatory Visit: Payer: Self-pay

## 2024-05-04 DIAGNOSIS — M25551 Pain in right hip: Secondary | ICD-10-CM | POA: Insufficient documentation

## 2024-05-04 DIAGNOSIS — M25552 Pain in left hip: Secondary | ICD-10-CM | POA: Insufficient documentation

## 2024-05-04 DIAGNOSIS — R29898 Other symptoms and signs involving the musculoskeletal system: Secondary | ICD-10-CM | POA: Diagnosis not present

## 2024-05-04 DIAGNOSIS — M2170 Unequal limb length (acquired), unspecified site: Secondary | ICD-10-CM | POA: Diagnosis not present

## 2024-05-04 DIAGNOSIS — R2689 Other abnormalities of gait and mobility: Secondary | ICD-10-CM | POA: Insufficient documentation

## 2024-05-04 DIAGNOSIS — Z7409 Other reduced mobility: Secondary | ICD-10-CM | POA: Diagnosis not present

## 2024-05-06 ENCOUNTER — Other Ambulatory Visit: Payer: Self-pay | Admitting: Vascular Surgery

## 2024-05-08 ENCOUNTER — Ambulatory Visit (HOSPITAL_COMMUNITY)

## 2024-05-08 DIAGNOSIS — M25552 Pain in left hip: Secondary | ICD-10-CM | POA: Diagnosis not present

## 2024-05-08 DIAGNOSIS — Z7409 Other reduced mobility: Secondary | ICD-10-CM

## 2024-05-08 DIAGNOSIS — M2170 Unequal limb length (acquired), unspecified site: Secondary | ICD-10-CM | POA: Diagnosis not present

## 2024-05-08 DIAGNOSIS — R29898 Other symptoms and signs involving the musculoskeletal system: Secondary | ICD-10-CM | POA: Diagnosis not present

## 2024-05-08 DIAGNOSIS — M25551 Pain in right hip: Secondary | ICD-10-CM | POA: Diagnosis not present

## 2024-05-08 DIAGNOSIS — R2689 Other abnormalities of gait and mobility: Secondary | ICD-10-CM | POA: Diagnosis not present

## 2024-05-08 NOTE — Therapy (Signed)
 OUTPATIENT PHYSICAL THERAPY LOWER EXTREMITY TREATMENT   Patient Name: Jorge Jefferson MRN: [id number] DOB:01/11/1946, 78 y.o., male Today's Date: 05/08/2024  END OF SESSION:  PT End of Session - 05/08/24 1740     Visit Number 2    Number of Visits 12    Date for PT Re-Evaluation 06/01/24    Authorization Type [employer] Medicare    Authorization Time Period seeking new auth    PT Start Time 1345    PT Stop Time 1430    PT Time Calculation (min) 45 min    Equipment Utilized During Treatment Gait belt    Activity Tolerance Patient tolerated treatment well    Behavior During Therapy WFL for tasks assessed/performed           Past Medical History:  Diagnosis Date   Diabetes mellitus without complication (HCC)    Hearing loss    Hyperlipidemia    Hypertension    Peripheral arterial disease (HCC)    Renal disorder    Past Surgical History:  Procedure Laterality Date   ABDOMINAL AORTOGRAM W/LOWER EXTREMITY N/A 03/25/2023   Procedure: ABDOMINAL AORTOGRAM W/LOWER EXTREMITY;  Surgeon: Gretta Lonni JINNY, MD;  Location: [facility];  Service: Cardiovascular;  Laterality: N/A;   CATARACT EXTRACTION W/PHACO Left 11/05/2023   Procedure: CATARACT EXTRACTION PHACO AND INTRAOCULAR LENS PLACEMENT (IOC);  Surgeon: Harrie Agent, MD;  Location: [facility];  Service: Ophthalmology;  Laterality: Left;  CDE: 6.25   CATARACT EXTRACTION W/PHACO Right 11/19/2023   Procedure: CATARACT EXTRACTION PHACO AND INTRAOCULAR LENS PLACEMENT (IOC);  Surgeon: Harrie Agent, MD;  Location: [facility];  Service: Ophthalmology;  Laterality: Right;  CDE: 7.22   PERIPHERAL VASCULAR INTERVENTION  03/25/2023   Procedure: PERIPHERAL VASCULAR INTERVENTION;  Surgeon: Gretta Lonni JINNY, MD;  Location: [facility];  Service: Cardiovascular;;   Patient Active Problem List   Diagnosis Date Noted   Mild protein-calorie malnutrition (HCC) 03/28/2023   Perforation of right tympanic membrane 03/18/2023    Atherosclerosis of native arteries of extremity with intermittent claudication (HCC) 03/01/2023   Eustachian tube dysfunction, bilateral 01/25/2023   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) 02/09/2022   Constipation 11/07/2021   Screening for malignant neoplasm of colon 11/07/2021   Advanced hepatic fibrosis 09/17/2021   Elevated ferritin level 09/17/2021   Hyperlipidemia 09/17/2021   History of colonic polyps 09/17/2021   Steatosis of liver 09/17/2021   Bilateral sensorineural hearing loss 03/15/2014   Chronic eustachian tube dysfunction 03/15/2014   Essential hypertension 03/15/2014   Mixed hearing loss of right ear 03/15/2014   Nasal septal perforation 03/15/2014    PCP: Leigh Lung, MD  REFERRING PROVIDER: Silva Juliene SAUNDERS, DPM  REFERRING DIAG: R26.89 (ICD-10-CM) - Balance disorder 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 Patient has joint stiffness in left foot, balance issues, 1cm limb length shorter on R, would like to include ROM, strengthening, stability, and gait/balance training. Modalities PRN at therapist's discretion.  THERAPY DIAG:  Pain of both hip joints  Impaired functional mobility, balance, gait, and endurance  Leg weakness, bilateral  Rationale for Evaluation and Treatment: Rehabilitation  ONSET DATE: A few months   SUBJECTIVE:   SUBJECTIVE STATEMENT: Daily: Reports he is not very confident with walking 1/10 in his hips right now. Feels like he can only walk about 5 minutes. Would like to get back to being  able to walk at [employer].   (Initial) Patient reports pain in hips when he walks and balance issues. Hip pain has been going on for a few months and it comes and goes. Reports hip pain is sharp. Reports if he walks around distance of apartment complex, it starts to hurt. Reports balance issue has been  going on for a little while longer than hip pain. Reports they put a stent in about a year ago and L leg is a little bit longer than R. Isn't sure if theres any correlation there.   PERTINENT HISTORY: PAD LE Stent placed 03/2023  PAIN:  Are you having pain? Yes: NPRS scale: None currently. When its there, 10/10 Pain location: Both hips Pain description: sharp pain  Aggravating factors: Walking Relieving factors: Standing and moving hips around   PRECAUTIONS: None  RED FLAGS: None   WEIGHT BEARING RESTRICTIONS: No  FALLS:  Has patient fallen in last 6 months? 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  PLOF: Independent  PATIENT GOALS: To not feel this pain. I want to be able to walk [place] like he used to.  NEXT MD VISIT: August 26th  OBJECTIVE:  Note: Objective measures were completed at Evaluation unless otherwise noted.  DIAGNOSTIC FINDINGS:    2023: FINDINGS: There is no evidence of hip fracture or dislocation. There is no evidence of arthropathy or other focal bone abnormality.   IMPRESSION: Negative.  PATIENT SURVEYS:  LEFS : Lower Extremity Functional Score: 48 / 80 = 60.0 %  ABC (05/08/24): 1200/1600 - 75%  COGNITION: Overall cognitive status: Within functional limits for tasks assessed     SENSATION: WFL  MUSCLE LENGTH: Hamstrings: Right ~110 bilat deg; Left ~110 bilat deg  -  moderate tightness during passive 90/90 test  POSTURE: rounded shoulders and forward head  PALPATION:   LOWER EXTREMITY ROM:  Active ROM Right eval Left eval  Hip flexion    Hip extension    Hip abduction    Hip adduction    Hip internal rotation    Hip external rotation    Knee flexion    Knee extension    Ankle dorsiflexion    Ankle  plantarflexion    Ankle inversion    Ankle eversion     (Blank rows = not tested)  LOWER EXTREMITY MMT:  MMT Right eval Left eval  Hip flexion 4- 4-  Hip extension 3+ 3+  Hip abduction 4 4  Hip adduction    Hip internal rotation    Hip external rotation    Knee flexion    Knee extension    Ankle dorsiflexion 5 5  Ankle plantarflexion    Ankle inversion    Ankle eversion     (Blank rows = not tested)  LOWER EXTREMITY SPECIAL TESTS:  Hip special tests: Belvie (FABER) test: positive   FUNCTIONAL TESTS:  30 seconds chair stand test: 10.5 STS, no UE  4 Stage Balance: Side by side: 30, very mild swaying Semi tandem: 30 each foot leading, mild swaying Full tandem: unable to with RLE leading, L foot: 3 Single Leg stance: R foot - Unable, L foot: 3  DGI (performed 05/08/24) 1. Gait level surface (2) Mild Impairment: Walks 20', uses assistive devices, slower speed, mild gait deviations. 2. Change in gait  speed (2) Mild Impairment: Is able to change speed but demonstrates mild gait deviations, or not gait deviations but unable to achieve a significant change in velocity, or uses an assistive device. 3. Gait with horizontal head turns (1) Moderate Impairment: Performs head turns with moderate change in gait velocity, slows down, staggers but recovers, can continue to walk. 4. Gait with vertical head turns 1) Moderate Impairment: Performs head turns with moderate change in gait velocity, slows down, staggers but recovers, can continue to walk. 5. Gait and pivot turn (2) Mild Impairment: Pivot turns safely in > 3 seconds and stops with no loss of balance. 6. Step over obstacle (2) Mild Impairment: Is able to step over box, but must slow down and adjust steps to clear box safely. 7. Step around obstacles (2) Mild Impairment: Is able to step around both cones, but must slow down and adjust steps to clear cones. 8. Stairs (2) Mild Impairment: Alternating feet, must use  rail.  TOTAL SCORE: 14 / 24   GAIT: Distance walked: 100 Assistive device utilized: None Level of assistance: Complete Independence Comments: Mild limp at times (slight leg length discrepancy), L leg demo inc flexion throughout cycle,                                                                                                                                 TREATMENT DATE:  05/08/24 - ABC scoring/assessment NM - DGI scoring/assessment TE - Bilateral knees to chest with ball under feet for improving functional (20 reps) - LTR with control as needed (15 reps) - HS stretching seated TA - Sit to stand 2 x 10 repetitions - rest break between required and cues to push through heels to engage gluts - Step overs with 4 block and reverse step x 10 on each side w/ cuing for knee alignment and requiring BUE support // bars  05/04/24: PT Eval and HEP   PATIENT EDUCATION:  Education details: PT evaluation, objective findings, POC, Importance of HEP, Precautions, Clinic policies Person educated: Patient Education method: Explanation and Demonstration Education comprehension: verbalized understanding and returned demonstration  HOME EXERCISE PROGRAM: Access Code: [email or web address] URL: [email or web address] Date: 05/04/2024 Prepared by: Rosaria Powell-Butler  Exercises - Seated Hamstring Stretch  - 2 x daily - 7 x weekly - 3 sets - 30 hold - Sit to Stand  - 2 x daily - 7 x weekly - 3 sets - 10 reps - Supine Bridge  - 2 x daily - 7 x weekly - 3 sets - 10 reps - Standing Tandem Balance with Counter Support  - 2 x daily - 7 x weekly - 3 sets - 30 hold  ASSESSMENT:  CLINICAL IMPRESSION: Daily: Pt tolerated treatment well with initiation gathering confidence scale followed by DGI testing with challenging patient in tasks multiple times for gait/balance intervention purposes. Followed by mobility interventions secondary to reports of stiffness in BLE and demonstrates stiffness in  lumbopelvic region  with reports of relief felt post LTR and DKTC interventions. Educated to perform in morning at home for improving mobility prior to start of day. Finalized session with standing and ADL sit to stand and step over performance for improving gait, transfer tolerance and BLE strength. Plan to assess response to interventions next session as well as compliance with HEP. Continued skilled PT services needed in order to improve functional transfer safety, tolerance and to engage patient with gait for health management and QOL.   (Initial) Patient is a 78 y.o. 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 On this date, patient demonstrates dec LE strength, impaired balance, altered gait, dec endurance, and inc pain. Hip pain is reproduced on R hip only during FABER testing. Patient also demo moderate tightness in hamstrings bilaterally. Patient will benefit from continued skilled physical therapy in order to address the above to improve his function and quality of life.    OBJECTIVE IMPAIRMENTS: Abnormal gait, decreased activity tolerance, decreased balance, decreased coordination, decreased endurance, decreased ROM, decreased strength, improper body mechanics, postural dysfunction, and pain.   ACTIVITY LIMITATIONS: lifting, bending, sitting, standing, squatting, stairs, and transfers  PARTICIPATION LIMITATIONS: meal prep, cleaning, laundry, community activity, and yard work  PERSONAL FACTORS: N/A are also affecting patient's functional outcome.   REHAB POTENTIAL: Good  CLINICAL DECISION MAKING: Stable/uncomplicated  EVALUATION COMPLEXITY: Low   GOALS: Goals reviewed with patient? No  SHORT TERM GOALS: Target date: 05/18/24 Patient will be independent with performance of HEP to demonstrate adequate self management of symptoms.  Baseline:  Goal status: INITIAL  2.    Patient will report at least a 25% improvement with function or pain overall since beginning PT. Baseline:  Goal status: INITIAL   LONG TERM GOALS: Target date: 06/15/24  Patient will improve LEFS by at least 9 points to demo improved self perceived function while meeting MCID. Baseline:  Goal status: INITIAL  2.  Patient will improve hip ext MMT to at least 4/5 to demonstrate to improved LE strength needed for functional transfer. Baseline:  Goal status: INITIAL  3.  Patient will improve 30 second chair stand by at least 3 STS in order to demonstrate improved LE endurance needed for community ambulation. Baseline:  Goal status: INITIAL  4.  Patient will maintain tandem balance for at least 10 seconds with each LE leading to demonstrate improved overall balance to decrease fall risk.  Baseline:  Goal status: INITIAL  5. Patient will reduce falls risk as indicated by Activities Specific Balance Confidence Scale (ABC) >67%.  Baseline:   Goal status: INITIAL   PLAN:  PT FREQUENCY: 2x/week  PT DURATION: 6 weeks  PLANNED INTERVENTIONS: 97164- PT Re-evaluation, 97110-Therapeutic exercises, 97530- Therapeutic activity, [id number]- Neuromuscular re-education, 97535- Self Care, 02859- Manual therapy, [id number]- Gait training, [phone]- Electrical stimulation (manual), [id number]- Traction (mechanical), [phone] (1-2 muscles), 20561 (3+ muscles)- Dry Needling, Patient/Family education, Balance training, Stair training, Taping, Joint mobilization, Spinal mobilization, Cryotherapy, and Moist heat  PLAN FOR NEXT SESSION: Review of HEP; LE strengthening, core strengthening, progress hip mobility  Jorge Jefferson PT, DPT [facility] [place] [phone] office  5:46 PM, 05/08/24

## 2024-05-10 ENCOUNTER — Other Ambulatory Visit: Payer: Self-pay

## 2024-05-10 MED ORDER — CLOPIDOGREL BISULFATE 75 MG PO TABS: 75.0000 mg | ORAL_TABLET | Freq: Every day | ORAL | 0 refills | Status: DC

## 2024-05-10 MED ORDER — CLOPIDOGREL BISULFATE 75 MG PO TABS: 75.0000 mg | ORAL_TABLET | Freq: Every day | ORAL | 10 refills | Status: AC

## 2024-05-11 ENCOUNTER — Encounter (HOSPITAL_COMMUNITY)

## 2024-05-18 ENCOUNTER — Encounter (HOSPITAL_COMMUNITY): Payer: Self-pay

## 2024-05-18 ENCOUNTER — Ambulatory Visit (HOSPITAL_COMMUNITY)
Admission: RE | Admit: 2024-05-18 | Discharge: 2024-05-18 | Disposition: A | Discharge status: Home / Self Care | Source: Ambulatory Visit | Attending: Family Medicine | Admitting: Family Medicine

## 2024-05-18 DIAGNOSIS — F17210 Nicotine dependence, cigarettes, uncomplicated: Secondary | ICD-10-CM

## 2024-05-24 ENCOUNTER — Other Ambulatory Visit: Payer: Self-pay

## 2024-05-24 ENCOUNTER — Emergency Department (HOSPITAL_COMMUNITY)
Admission: EM | Admit: 2024-05-24 | Discharge: 2024-05-24 | Disposition: A | Discharge status: Home / Self Care | Attending: Emergency Medicine | Admitting: Emergency Medicine

## 2024-05-24 DIAGNOSIS — Z79899 Other long term (current) drug therapy: Secondary | ICD-10-CM | POA: Diagnosis not present

## 2024-05-24 DIAGNOSIS — E119 Type 2 diabetes mellitus without complications: Secondary | ICD-10-CM | POA: Insufficient documentation

## 2024-05-24 DIAGNOSIS — I10 Essential (primary) hypertension: Secondary | ICD-10-CM | POA: Insufficient documentation

## 2024-05-24 DIAGNOSIS — Z7902 Long term (current) use of antithrombotics/antiplatelets: Secondary | ICD-10-CM | POA: Diagnosis not present

## 2024-05-24 DIAGNOSIS — M79675 Pain in left toe(s): Secondary | ICD-10-CM | POA: Diagnosis present

## 2024-05-24 DIAGNOSIS — M109 Gout, unspecified: Secondary | ICD-10-CM | POA: Diagnosis not present

## 2024-05-24 MED ORDER — PREDNISONE 50 MG PO TABS
60.0000 mg | ORAL_TABLET | Freq: Once | ORAL | Status: AC
  Administered 2024-05-24: 60 mg via ORAL
  Filled 2024-05-24: qty 1

## 2024-05-24 MED ORDER — OXYCODONE-ACETAMINOPHEN 5-325 MG PO TABS
1.0000 | ORAL_TABLET | Freq: Once | ORAL | Status: AC
  Administered 2024-05-24: 1 via ORAL
  Filled 2024-05-24: qty 1

## 2024-05-24 MED ORDER — PREDNISONE 50 MG PO TABS: 50.0000 mg | ORAL_TABLET | Freq: Every day | ORAL | 0 refills | Status: AC

## 2024-05-24 MED ORDER — OXYCODONE HCL 5 MG PO TABS: 5.0000 mg | ORAL_TABLET | ORAL | 0 refills | Status: AC | PRN

## 2024-05-24 NOTE — ED Provider Notes (Signed)
 [place] EMERGENCY DEPARTMENT AT [facility] Provider Note   CSN: [id number] Arrival date & time: 05/24/24  9443     Patient presents with: Toe Pain   Jorge Jefferson is a 78 y.o. male.   The history is provided by the patient.  Toe Pain   He has history of hypertension, diabetes, hyperlipidemia, and complains of severe pain in his left first toe for the last 2-3 days.  He denies any trauma.  He has tried soaking it in salt water  and tried applying various topical remedies without any relief.  He has never had anything like this before.    Prior to Admission medications   Medication Sig Start Date End Date Taking? Authorizing Provider  oxyCODONE  (ROXICODONE ) 5 MG immediate release tablet Take 1 tablet (5 mg total) by mouth every 4 (four) hours as needed for severe pain (pain score 7-10). 05/24/24  Yes Raford Lenis, MD  predniSONE  (DELTASONE ) 50 MG tablet Take 1 tablet (50 mg total) by mouth daily. 05/24/24  Yes Raford Lenis, MD  amLODipine (NORVASC) 10 MG tablet Take 10 mg by mouth daily. 04/03/15   [provider]  atorvastatin (LIPITOR) 10 MG tablet Take 10 mg by mouth daily. 08/08/21   [provider]  Blood Glucose Monitoring Suppl (ONE TOUCH ULTRA 2) w/Device KIT  11/28/19   [provider]  cholecalciferol (VITAMIN D) 25 MCG (1000 UNIT) tablet Take 1,000 Units by mouth daily.    [provider]  ciprofloxacin  (CILOXAN ) 0.3 % ophthalmic solution 3 drops 2 (two) times daily. Place in both Ears 07/21/19   [provider]  cloNIDine (CATAPRES) 0.1 MG tablet Take 0.1 mg by mouth daily. 09/03/21   [provider]  clopidogrel  (PLAVIX ) 75 MG tablet Take 1 tablet (75 mg total) by mouth daily. 05/10/24 04/05/25  Gretta Lonni JINNY, MD  dexamethasone  (DECADRON ) 0.1 % ophthalmic solution 2 drops 2 (two) times daily. Place in both Ears 01/25/23   [provider]  Lancets [facility] CATHRYNE PLUS [place]) MISC  11/28/19    [provider]  Magnesium 250 MG TABS Take 250 mg by mouth daily.    [provider]  Multiple Vitamins-Minerals (CENTRUM ADULTS) TABS Take 1 tablet by mouth daily.    [provider]  nicotine (NICODERM CQ - DOSED IN MG/24 HOURS) 21 mg/24hr patch Place 21 mg onto the skin every other day. 07/29/20   [provider]  Omega-3 Fatty Acids (OMEGA III EPA+DHA) 1000 MG CAPS Krill oil    [provider]  [facility] ULTRA test strip  11/28/19   [provider]    Allergies: Patient has no known allergies.    Review of Systems  All other systems reviewed and are negative.   Updated Vital Signs BP (!) 156/88   Pulse 78   Resp 20   Ht 6' 9 (2.057 m)   Wt 117.9 kg   SpO2 96%   BMI 27.86 kg/m   Physical Exam Vitals and nursing note reviewed.   78 year old male, resting comfortably and in no acute distress. Vital signs are significant for elevated blood pressure. Oxygen saturation is 96%, which is normal. Head is normocephalic and atraumatic. PERRLA, EOMI.  Lungs are clear without rales, wheezes, or rhonchi. Heart has regular rate and rhythm without murmur. Extremities: There is erythema, soft tissue swelling, warmth, marked tenderness around the left first MTP joint consistent with acute gouty arthritis. Skin is warm and dry without other rash. Neurologic:  Awake and alert, moves all extremities equally.         Procedures   Medications Ordered in the ED  oxyCODONE -acetaminophen  (PERCOCET/ROXICET) 5-325 MG per tablet 1 tablet (1 tablet Oral Given 05/24/24 0625)  predniSONE  (DELTASONE ) tablet 60 mg (60 mg Oral Given 05/24/24 9374)                                    Medical Decision Making Risk Prescription drug management.   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  I have ordered a dose of oxycodone -acetaminophen  and a dose of prednisone .  I am discharging him with prescriptions for prednisone  and oxycodone .  He  will need to follow-up with his primary care provider.     Final diagnoses:  Acute gouty arthritis  Podagra    ED Discharge Orders          Ordered    predniSONE  (DELTASONE ) 50 MG tablet  Daily        05/24/24 0626    oxyCODONE  (ROXICODONE ) 5 MG immediate release tablet  Every 4 hours PRN        05/24/24 0626               Raford Lenis, MD 05/24/24 [phone]

## 2024-05-24 NOTE — ED Notes (Signed)
 ED Provider at bedside.

## 2024-05-24 NOTE — Discharge Instructions (Signed)
 Apply ice for 30 minutes at a time, 4 times a day.  You may take acetaminophen  as needed for pain, you may add ibuprofen as needed to get additional pain relief.  Please reserve oxycodone  for pain not relieved by the combination of acetaminophen  and ibuprofen.

## 2024-05-24 NOTE — ED Triage Notes (Addendum)
 Pt c/o left great toe pain x2 days, denies any injury

## 2024-06-01 ENCOUNTER — Ambulatory Visit (HOSPITAL_COMMUNITY): Attending: Podiatry | Admitting: Physical Therapy

## 2024-06-01 DIAGNOSIS — M25551 Pain in right hip: Secondary | ICD-10-CM | POA: Insufficient documentation

## 2024-06-01 DIAGNOSIS — Z7409 Other reduced mobility: Secondary | ICD-10-CM | POA: Insufficient documentation

## 2024-06-01 DIAGNOSIS — R29898 Other symptoms and signs involving the musculoskeletal system: Secondary | ICD-10-CM | POA: Diagnosis not present

## 2024-06-01 DIAGNOSIS — M25552 Pain in left hip: Secondary | ICD-10-CM | POA: Diagnosis not present

## 2024-06-01 NOTE — Therapy (Signed)
 OUTPATIENT PHYSICAL THERAPY LOWER EXTREMITY TREATMENT   Patient Name: Jorge Jefferson MRN: [id number] DOB:1946/02/08, 78 y.o., male Today's Date: 06/01/2024  END OF SESSION:  PT End of Session - 06/01/24 0807     Visit Number 3    Number of Visits 12    Date for PT Re-Evaluation 06/15/24    Authorization Type [employer]    Authorization Time Period 6 visits approved 7/17-8/28    Authorization - Visit Number 3    Authorization - Number of Visits 6    Progress Note Due on Visit 6    PT Start Time 0805    PT Stop Time 0845    PT Time Calculation (min) 40 min    Equipment Utilized During Treatment Gait belt    Activity Tolerance Patient tolerated treatment well    Behavior During Therapy WFL for tasks assessed/performed           Past Medical History:  Diagnosis Date   Diabetes mellitus without complication (HCC)    Hearing loss    Hyperlipidemia    Hypertension    Peripheral arterial disease (HCC)    Renal disorder    Past Surgical History:  Procedure Laterality Date   ABDOMINAL AORTOGRAM W/LOWER EXTREMITY N/A 03/25/2023   Procedure: ABDOMINAL AORTOGRAM W/LOWER EXTREMITY;  Surgeon: Gretta Lonni JINNY, MD;  Location: [facility];  Service: Cardiovascular;  Laterality: N/A;   CATARACT EXTRACTION W/PHACO Left 11/05/2023   Procedure: CATARACT EXTRACTION PHACO AND INTRAOCULAR LENS PLACEMENT (IOC);  Surgeon: Harrie Agent, MD;  Location: [facility];  Service: Ophthalmology;  Laterality: Left;  CDE: 6.25   CATARACT EXTRACTION W/PHACO Right 11/19/2023   Procedure: CATARACT EXTRACTION PHACO AND INTRAOCULAR LENS PLACEMENT (IOC);  Surgeon: Harrie Agent, MD;  Location: [facility];  Service: Ophthalmology;  Laterality: Right;  CDE: 7.22   PERIPHERAL VASCULAR INTERVENTION  03/25/2023   Procedure: PERIPHERAL VASCULAR INTERVENTION;  Surgeon: Gretta Lonni JINNY, MD;  Location: [facility];  Service: Cardiovascular;;   Patient Active Problem List   Diagnosis Date Noted    Mild protein-calorie malnutrition (HCC) 03/28/2023   Perforation of right tympanic membrane 03/18/2023   Atherosclerosis of native arteries of extremity with intermittent claudication (HCC) 03/01/2023   Eustachian tube dysfunction, bilateral 01/25/2023   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) 02/09/2022   Constipation 11/07/2021   Screening for malignant neoplasm of colon 11/07/2021   Advanced hepatic fibrosis 09/17/2021   Elevated ferritin level 09/17/2021   Hyperlipidemia 09/17/2021   History of colonic polyps 09/17/2021   Steatosis of liver 09/17/2021   Bilateral sensorineural hearing loss 03/15/2014   Chronic eustachian tube dysfunction 03/15/2014   Essential hypertension 03/15/2014   Mixed hearing loss of right ear 03/15/2014   Nasal septal perforation 03/15/2014    PCP: Leigh Lung, MD  REFERRING PROVIDER: Silva Juliene SAUNDERS, DPM  REFERRING DIAG: R26.89 (ICD-10-CM) - Balance disorder 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 Patient has joint stiffness in left foot, balance issues, 1cm limb length shorter on R, would like to include ROM, strengthening, stability, and gait/balance training. Modalities PRN at therapist's discretion.  THERAPY DIAG:  Pain of both hip joints  Impaired functional mobility, balance, gait, and endurance  Leg weakness, bilateral  Rationale for Evaluation and Treatment: Rehabilitation  ONSET DATE: A few months   SUBJECTIVE:   SUBJECTIVE STATEMENT: Daily: pt is very  tall (6'9)  states he has not been here in 3.5  weeks due to gout flair up.  Admits to not doing his HEP in this time due to pain.  Reports no pain anywhere currently.    (Initial) Patient reports pain in hips when he walks and balance issues. Hip pain has been going on for a few months and it comes and goes. Reports hip pain is sharp.  Reports if he walks around distance of apartment complex, it starts to hurt. Reports balance issue has been going on for a little while longer than hip pain. Reports they put a stent in about a year ago and L leg is a little bit longer than R. Isn't sure if theres any correlation there.   PERTINENT HISTORY: PAD LE Stent placed 03/2023  PAIN:  Are you having pain? Yes: NPRS scale: None currently. When its there, 10/10 Pain location: Both hips Pain description: sharp pain  Aggravating factors: Walking Relieving factors: Standing and moving hips around   PRECAUTIONS: None  RED FLAGS: None   WEIGHT BEARING RESTRICTIONS: No  FALLS:  Has patient fallen in last 6 months? 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  PLOF: Independent  PATIENT GOALS: To not feel this pain. I want to be able to walk up around [place] like he used to.  NEXT MD VISIT: August 26th  OBJECTIVE:  Note: Objective measures were completed at Evaluation unless otherwise noted.  DIAGNOSTIC FINDINGS:    2023: FINDINGS: There is no evidence of hip fracture or dislocation. There is no evidence of arthropathy or other focal bone abnormality.   IMPRESSION: Negative.  PATIENT SURVEYS:  LEFS : Lower Extremity Functional Score: 48 / 80 = 60.0 %  ABC (05/08/24): 1200/1600 - 75%  COGNITION: Overall cognitive status: Within functional limits for tasks assessed     SENSATION: WFL  MUSCLE LENGTH: Hamstrings: Right ~110 bilat deg; Left ~110 bilat deg  -  moderate tightness during passive 90/90 test  POSTURE: rounded shoulders and forward head  PALPATION:   LOWER EXTREMITY ROM:  Active ROM Right eval Left eval  Hip flexion    Hip extension    Hip abduction    Hip adduction    Hip internal  rotation    Hip external rotation    Knee flexion    Knee extension    Ankle dorsiflexion    Ankle plantarflexion    Ankle inversion    Ankle eversion     (Blank rows = not tested)  LOWER EXTREMITY MMT:  MMT Right eval Left eval  Hip flexion 4- 4-  Hip extension 3+ 3+  Hip abduction 4 4  Hip adduction    Hip internal rotation    Hip external rotation    Knee flexion    Knee extension    Ankle dorsiflexion 5 5  Ankle plantarflexion    Ankle inversion    Ankle eversion     (Blank rows = not tested)  LOWER EXTREMITY SPECIAL TESTS:  Hip special tests: [place] (FABER) test: positive   FUNCTIONAL TESTS:  30 seconds chair stand test: 10.5 STS, no UE  4 Stage Balance: Side by side: 30, very mild swaying Semi tandem: 30 each foot leading, mild swaying Full tandem: unable to with RLE leading, L foot: 3 Single Leg stance: R  foot - Unable, L foot: 3  DGI (performed 05/08/24) 1. Gait level surface (2) Mild Impairment: Walks 20', uses assistive devices, slower speed, mild gait deviations. 2. Change in gait speed (2) Mild Impairment: Is able to change speed but demonstrates mild gait deviations, or not gait deviations but unable to achieve a significant change in velocity, or uses an assistive device. 3. Gait with horizontal head turns (1) Moderate Impairment: Performs head turns with moderate change in gait velocity, slows down, staggers but recovers, can continue to walk. 4. Gait with vertical head turns 1) Moderate Impairment: Performs head turns with moderate change in gait velocity, slows down, staggers but recovers, can continue to walk. 5. Gait and pivot turn (2) Mild Impairment: Pivot turns safely in > 3 seconds and stops with no loss of balance. 6. Step over obstacle (2) Mild Impairment: Is able to step over box, but must slow down and adjust steps to clear box safely. 7. Step around obstacles (2) Mild Impairment: Is able to step around both cones, but must slow down  and adjust steps to clear cones. 8. Stairs (2) Mild Impairment: Alternating feet, must use rail.  TOTAL SCORE: 14 / 24   GAIT: Distance walked: 100 Assistive device utilized: None Level of assistance: Complete Independence Comments: Mild limp at times (slight leg length discrepancy), L leg demo inc flexion throughout cycle,                                                                                                                                 TREATMENT DATE:  06/01/24 Standing:  in // bars with bil UE assist heelraises 20X  4 lateral step ups 10X each  6 forward step ups 10X each  6 forward step downs 10X each  Tandem stance 30 X 3 each LE leading  6 forward lunges intermittent HHA  SLS 2 max either LE  Hip abduction 10X each  Hip extension 10X each Sidestepping on 10 line 3RT with cues for upright posturing Sit to stand with 2 blue foam in chair no UE assist 6X Nustep seat 17 UE/LE level 4 [place] 5 minutes  05/08/24 - ABC scoring/assessment NM - DGI scoring/assessment TE - Bilateral knees to chest with ball under feet for improving functional (20 reps) - LTR with control as needed (15 reps) - HS stretching seated TA - Sit to stand 2 x 10 repetitions - rest break between required and cues to push through heels to engage gluts - Step overs with 4 block and reverse step x 10 on each side w/ cuing for knee alignment and requiring BUE support // bars  05/04/24: PT Eval and HEP   PATIENT EDUCATION:  Education details: PT evaluation, objective findings, POC, Importance of HEP, Precautions, Clinic policies Person educated: Patient Education method: Explanation and Demonstration Education comprehension: verbalized understanding and returned demonstration  HOME EXERCISE PROGRAM: Access Code: [email or web address] URL: [email or web address] Date: 05/04/2024 Prepared by: Rosaria  Powell-Butler  Exercises - Seated Hamstring Stretch  - 2 x daily - 7 x  weekly - 3 sets - 30 hold - Sit to Stand  - 2 x daily - 7 x weekly - 3 sets - 10 reps - Supine Bridge  - 2 x daily - 7 x weekly - 3 sets - 10 reps - Standing Tandem Balance with Counter Support  - 2 x daily - 7 x weekly - 3 sets - 30 hold  ASSESSMENT:  CLINICAL IMPRESSION: Daily: pt returns after nearly 4 weeks since last visit due to gout flair up.  Admits to not doing the exercises as he should due to pain.  Reports balance is his major deficit.  Resumed LE strengthening and balance challenges this session.  Pt with cues needed the most for posturing (fwd flex) but also for technique.  Pt was able to keep count of repetitions.  No seated rest breaks needed, only one short water  break.  Single leg stance most challenging at only 2 each without UE assist.  1 foam added to seat of chair due to height and knee discomfort but able to complete without UE assist.  Finished on nustep for activity tolerance.   Encouraged to resume HEP at this time.   Pt will continue to benefit from skilled therapy in order to improve functional transfer safety, tolerance and to engage patient with gait for health management and QOL.   (Initial) Patient is a 78 y.o. 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 On this date, patient demonstrates dec LE strength, impaired balance, altered gait, dec endurance, and inc pain. Hip pain is reproduced on R hip only during FABER testing. Patient also demo moderate tightness in hamstrings bilaterally. Patient will benefit from continued skilled physical therapy in order to address the above to improve his function and quality of life.    OBJECTIVE IMPAIRMENTS: Abnormal gait, decreased activity tolerance, decreased balance, decreased coordination, decreased endurance, decreased ROM, decreased strength, improper body mechanics, postural dysfunction, and pain.    ACTIVITY LIMITATIONS: lifting, bending, sitting, standing, squatting, stairs, and transfers  PARTICIPATION LIMITATIONS: meal prep, cleaning, laundry, community activity, and yard work  PERSONAL FACTORS: N/A are also affecting patient's functional outcome.   REHAB POTENTIAL: Good  CLINICAL DECISION MAKING: Stable/uncomplicated  EVALUATION COMPLEXITY: Low   GOALS: Goals reviewed with patient? No  SHORT TERM GOALS: Target date: 05/18/24 Patient will be independent with performance of HEP to demonstrate adequate self management of symptoms.  Baseline:  Goal status: INITIAL  2.   Patient will report at least a 25% improvement with function or pain overall since beginning PT. Baseline:  Goal status: INITIAL   LONG TERM GOALS: Target date: 06/15/24  Patient will improve LEFS by at least 9 points to demo improved self perceived function while meeting MCID. Baseline:  Goal status: INITIAL  2.  Patient will improve hip ext MMT to at least 4/5 to demonstrate to improved LE strength needed for functional transfer. Baseline:  Goal status: INITIAL  3.  Patient will improve 30 second chair stand by at least 3 STS in order to demonstrate improved LE endurance needed for community ambulation. Baseline:  Goal status: INITIAL  4.  Patient will maintain tandem balance for at least 10 seconds with each LE leading to demonstrate improved overall balance to decrease fall risk.  Baseline:  Goal status: INITIAL  5. Patient will reduce  falls risk as indicated by Activities Specific Balance Confidence Scale (ABC) >67%.  Baseline:   Goal status: INITIAL   PLAN:  PT FREQUENCY: 2x/week  PT DURATION: 6 weeks  PLANNED INTERVENTIONS: [id number]- PT Re-evaluation, 97110-Therapeutic exercises, 97530- Therapeutic activity, [id number]- Neuromuscular re-education, 97535- Self Care, [id number]- Manual therapy, [id number]- Gait training, [phone]- Electrical stimulation (manual), [id number]- Traction (mechanical), [phone] (1-2  muscles), [id number] (3+ muscles)- Dry Needling, Patient/Family education, Balance training, Stair training, Taping, Joint mobilization, Spinal mobilization, Cryotherapy, and Moist heat  PLAN FOR NEXT SESSION: progress LE strengthening, core strengthening, progress hip mobility and balance.  Greig KATHEE Fuse, PTA/CLT [facility] Ph: [phone]  8:46 AM, 06/01/24

## 2024-06-06 ENCOUNTER — Other Ambulatory Visit: Payer: Self-pay | Admitting: Vascular Surgery

## 2024-06-07 ENCOUNTER — Encounter (HOSPITAL_COMMUNITY): Payer: Self-pay

## 2024-06-07 ENCOUNTER — Encounter (HOSPITAL_COMMUNITY)

## 2024-06-07 DIAGNOSIS — Z1211 Encounter for screening for malignant neoplasm of colon: Secondary | ICD-10-CM | POA: Diagnosis not present

## 2024-06-07 NOTE — Therapy (Incomplete)
 OUTPATIENT PHYSICAL THERAPY LOWER EXTREMITY TREATMENT   Patient Name: Jorge Jefferson MRN: [id number] DOB:December 27, 1945, 78 y.o., male Today's Date: 06/07/2024  END OF SESSION:     Past Medical History:  Diagnosis Date   Diabetes mellitus without complication (HCC)    Hearing loss    Hyperlipidemia    Hypertension    Peripheral arterial disease (HCC)    Renal disorder    Past Surgical History:  Procedure Laterality Date   ABDOMINAL AORTOGRAM W/LOWER EXTREMITY N/A 03/25/2023   Procedure: ABDOMINAL AORTOGRAM W/LOWER EXTREMITY;  Surgeon: Gretta Lonni JINNY, MD;  Location: [facility];  Service: Cardiovascular;  Laterality: N/A;   CATARACT EXTRACTION W/PHACO Left 11/05/2023   Procedure: CATARACT EXTRACTION PHACO AND INTRAOCULAR LENS PLACEMENT (IOC);  Surgeon: Harrie Agent, MD;  Location: [facility];  Service: Ophthalmology;  Laterality: Left;  CDE: 6.25   CATARACT EXTRACTION W/PHACO Right 11/19/2023   Procedure: CATARACT EXTRACTION PHACO AND INTRAOCULAR LENS PLACEMENT (IOC);  Surgeon: Harrie Agent, MD;  Location: [facility];  Service: Ophthalmology;  Laterality: Right;  CDE: 7.22   PERIPHERAL VASCULAR INTERVENTION  03/25/2023   Procedure: PERIPHERAL VASCULAR INTERVENTION;  Surgeon: Gretta Lonni JINNY, MD;  Location: [facility];  Service: Cardiovascular;;   Patient Active Problem List   Diagnosis Date Noted   Mild protein-calorie malnutrition (HCC) 03/28/2023   Perforation of right tympanic membrane 03/18/2023   Atherosclerosis of native arteries of extremity with intermittent claudication (HCC) 03/01/2023   Eustachian tube dysfunction, bilateral 01/25/2023   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) 02/09/2022   Constipation 11/07/2021   Screening for malignant neoplasm of colon 11/07/2021   Advanced hepatic fibrosis 09/17/2021   Elevated ferritin level 09/17/2021   Hyperlipidemia 09/17/2021   History of colonic polyps  09/17/2021   Steatosis of liver 09/17/2021   Bilateral sensorineural hearing loss 03/15/2014   Chronic eustachian tube dysfunction 03/15/2014   Essential hypertension 03/15/2014   Mixed hearing loss of right ear 03/15/2014   Nasal septal perforation 03/15/2014    PCP: Leigh Lung, MD  REFERRING PROVIDER: Silva Juliene SAUNDERS, DPM  REFERRING DIAG: R26.89 (ICD-10-CM) - Balance disorder 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 Patient has joint stiffness in left foot, balance issues, 1cm limb length shorter on R, would like to include ROM, strengthening, stability, and gait/balance training. Modalities PRN at therapist's discretion.  THERAPY DIAG:  Impaired functional mobility, balance, gait, and endurance  Leg weakness, bilateral  Pain of both hip joints  Rationale for Evaluation and Treatment: Rehabilitation  ONSET DATE: A few months   SUBJECTIVE:   SUBJECTIVE STATEMENT: Daily: pt *** is very tall (6'9)  states he has not been here in 3.5  weeks due to gout flair up.  Admits to not doing his HEP in this time due to pain.  Reports no pain anywhere currently.    (Initial) Patient reports pain in hips when he walks and balance issues. Hip pain has been going on for a few months and it comes and goes. Reports hip pain is sharp. Reports if he walks around distance of apartment complex, it starts to hurt. Reports balance issue has been going on for a little while longer than hip pain. Reports they put a stent in about a year ago and L leg is a little bit longer than R. Isn't sure if theres any correlation there.  PERTINENT HISTORY: PAD LE Stent placed 03/2023  PAIN:  Are you having pain? Yes: NPRS scale: None currently. When its there, 10/10 Pain location: Both hips Pain description: sharp pain  Aggravating factors: Walking Relieving factors: Standing and moving hips around    PRECAUTIONS: None  RED FLAGS: None   WEIGHT BEARING RESTRICTIONS: No  FALLS:  Has patient fallen in last 6 months? 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  PLOF: Independent  PATIENT GOALS: To not feel this pain. I want to be able to walk up around [place] like he used to.  NEXT MD VISIT: August 26th  OBJECTIVE:  Note: Objective measures were completed at Evaluation unless otherwise noted.  DIAGNOSTIC FINDINGS:    2023: FINDINGS: There is no evidence of hip fracture or dislocation. There is no evidence of arthropathy or other focal bone abnormality.   IMPRESSION: Negative.  PATIENT SURVEYS:  LEFS : Lower Extremity Functional Score: 48 / 80 = 60.0 %  ABC (05/08/24): 1200/1600 - 75%  COGNITION: Overall cognitive status: Within functional limits for tasks assessed     SENSATION: WFL  MUSCLE LENGTH: Hamstrings: Right ~110 bilat deg; Left ~110 bilat deg  -  moderate tightness during passive 90/90 test  POSTURE: rounded shoulders and forward head  PALPATION:   LOWER EXTREMITY ROM:  Active ROM Right eval Left eval  Hip flexion    Hip extension    Hip abduction    Hip adduction    Hip internal rotation    Hip external rotation    Knee flexion    Knee extension    Ankle dorsiflexion    Ankle plantarflexion    Ankle inversion    Ankle eversion     (Blank rows = not tested)  LOWER EXTREMITY MMT:  MMT Right eval Left eval  Hip flexion 4- 4-  Hip extension 3+ 3+  Hip abduction 4 4  Hip adduction    Hip internal rotation    Hip external rotation    Knee flexion    Knee extension    Ankle dorsiflexion 5 5  Ankle plantarflexion    Ankle inversion    Ankle eversion     (Blank rows = not tested)  LOWER EXTREMITY SPECIAL TESTS:  Hip  special tests: Bel[place] (FABER) test: positive   FUNCTIONAL TESTS:  30 seconds chair stand test: 10.5 STS, no UE  4 Stage Balance: Side by side: 30, very mild swaying Semi tandem: 30 each foot leading, mild swaying Full tandem: unable to with RLE leading, L foot: 3 Single Leg stance: R foot - Unable, L foot: 3  DGI (performed 05/08/24) 1. Gait level surface (2) Mild Impairment: Walks 20', uses assistive devices, slower speed, mild gait deviations. 2. Change in gait speed (2) Mild Impairment: Is able to change speed but demonstrates mild gait deviations, or not gait deviations but unable to achieve a significant change in velocity, or uses an assistive device. 3. Gait with horizontal head turns (1) Moderate Impairment: Performs head turns with moderate change in gait velocity, slows down, staggers but recovers, can continue to walk. 4. Gait with vertical head turns 1) Moderate Impairment: Performs head turns with moderate change in gait velocity, slows down, staggers but recovers, can continue to walk. 5. Gait and pivot turn (2) Mild Impairment: Pivot turns safely in >  3 seconds and stops with no loss of balance. 6. Step over obstacle (2) Mild Impairment: Is able to step over box, but must slow down and adjust steps to clear box safely. 7. Step around obstacles (2) Mild Impairment: Is able to step around both cones, but must slow down and adjust steps to clear cones. 8. Stairs (2) Mild Impairment: Alternating feet, must use rail.  TOTAL SCORE: 14 / 24   GAIT: Distance walked: 100 Assistive device utilized: None Level of assistance: Complete Independence Comments: Mild limp at times (slight leg length discrepancy), L leg demo inc flexion throughout cycle,                                                                                                                                 TREATMENT DATE:  06/07/24 ***  06/01/24 Standing:  in // bars with bil UE assist heelraises  20X  4 lateral step ups 10X each  6 forward step ups 10X each  6 forward step downs 10X each  Tandem stance 30 X 3 each LE leading  6 forward lunges intermittent HHA  SLS 2 max either LE  Hip abduction 10X each  Hip extension 10X each Sidestepping on 10 line 3RT with cues for upright posturing Sit to stand with 2 blue foam in chair no UE assist 6X Nustep seat 17 UE/LE level 4 [place] 5 minutes  05/08/24 - ABC scoring/assessment NM - DGI scoring/assessment TE - Bilateral knees to chest with ball under feet for improving functional (20 reps) - LTR with control as needed (15 reps) - HS stretching seated TA - Sit to stand 2 x 10 repetitions - rest break between required and cues to push through heels to engage gluts - Step overs with 4 block and reverse step x 10 on each side w/ cuing for knee alignment and requiring BUE support // bars  05/04/24: PT Eval and HEP   PATIENT EDUCATION:  Education details: PT evaluation, objective findings, POC, Importance of HEP, Precautions, Clinic policies Person educated: Patient Education method: Explanation and Demonstration Education comprehension: verbalized understanding and returned demonstration  HOME EXERCISE PROGRAM: Access Code: [email or web address] URL: [email or web address] Date: 05/04/2024 Prepared by: Rosaria Powell-Butler  Exercises - Seated Hamstring Stretch  - 2 x daily - 7 x weekly - 3 sets - 30 hold - Sit to Stand  - 2 x daily - 7 x weekly - 3 sets - 10 reps - Supine Bridge  - 2 x daily - 7 x weekly - 3 sets - 10 reps - Standing Tandem Balance with Counter Support  - 2 x daily - 7 x weekly - 3 sets - 30 hold  ASSESSMENT:  CLINICAL IMPRESSION: Daily: *** pt returns after nearly 4 weeks since last visit due to gout flair up.  Admits to not doing the exercises as he should due to pain.  Reports balance is his major deficit.  Resumed LE strengthening and  balance challenges this session.  Pt with cues needed the  most for posturing (fwd flex) but also for technique.  Pt was able to keep count of repetitions.  No seated rest breaks needed, only one short water  break.  Single leg stance most challenging at only 2 each without UE assist.  1 foam added to seat of chair due to height and knee discomfort but able to complete without UE assist.  Finished on nustep for activity tolerance.   Encouraged to resume HEP at this time.   Pt will continue to benefit from skilled therapy in order to improve functional transfer safety, tolerance and to engage patient with gait for health management and QOL.   (Initial) Patient is a 78 y.o. 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 On this date, patient demonstrates dec LE strength, impaired balance, altered gait, dec endurance, and inc pain. Hip pain is reproduced on R hip only during FABER testing. Patient also demo moderate tightness in hamstrings bilaterally. Patient will benefit from continued skilled physical therapy in order to address the above to improve his function and quality of life.    OBJECTIVE IMPAIRMENTS: Abnormal gait, decreased activity tolerance, decreased balance, decreased coordination, decreased endurance, decreased ROM, decreased strength, improper body mechanics, postural dysfunction, and pain.   ACTIVITY LIMITATIONS: lifting, bending, sitting, standing, squatting, stairs, and transfers  PARTICIPATION LIMITATIONS: meal prep, cleaning, laundry, community activity, and yard work  PERSONAL FACTORS: N/A are also affecting patient's functional outcome.   REHAB POTENTIAL: Good  CLINICAL DECISION MAKING: Stable/uncomplicated  EVALUATION COMPLEXITY: Low   GOALS: Goals reviewed with patient? No  SHORT TERM GOALS: Target date: 05/18/24 Patient will be independent with performance of HEP to demonstrate adequate self management of  symptoms.  Baseline:  Goal status: INITIAL  2.   Patient will report at least a 25% improvement with function or pain overall since beginning PT. Baseline:  Goal status: INITIAL   LONG TERM GOALS: Target date: 06/15/24  Patient will improve LEFS by at least 9 points to demo improved self perceived function while meeting MCID. Baseline:  Goal status: INITIAL  2.  Patient will improve hip ext MMT to at least 4/5 to demonstrate to improved LE strength needed for functional transfer. Baseline:  Goal status: INITIAL  3.  Patient will improve 30 second chair stand by at least 3 STS in order to demonstrate improved LE endurance needed for community ambulation. Baseline:  Goal status: INITIAL  4.  Patient will maintain tandem balance for at least 10 seconds with each LE leading to demonstrate improved overall balance to decrease fall risk.  Baseline:  Goal status: INITIAL  5. Patient will reduce falls risk as indicated by Activities Specific Balance Confidence Scale (ABC) >67%.  Baseline:   Goal status: INITIAL   PLAN:  PT FREQUENCY: 2x/week  PT DURATION: 6 weeks  PLANNED INTERVENTIONS: 97164- PT Re-evaluation, 97110-Therapeutic exercises, 97530- Therapeutic activity, [id number]- Neuromuscular re-education, 97535- Self Care, [id number]- Manual therapy, [id number]- Gait training, [id number]- Electrical stimulation (manual), [id number]- Traction (mechanical), [phone] (1-2 muscles), 20561 (3+ muscles)- Dry Needling, Patient/Family education, Balance training, Stair training, Taping, Joint mobilization, Spinal mobilization, Cryotherapy, and Moist heat  PLAN FOR NEXT SESSION: progress LE strengthening, core strengthening, progress hip mobility and balance.  Lamarr LITTIE Citrin PT, DPT [facility] [phone] office   8:50 AM, 06/07/24

## 2024-06-13 ENCOUNTER — Encounter: Payer: Self-pay | Admitting: Podiatry

## 2024-06-13 ENCOUNTER — Ambulatory Visit (INDEPENDENT_AMBULATORY_CARE_PROVIDER_SITE_OTHER): Admitting: Podiatry

## 2024-06-13 DIAGNOSIS — E1151 Type 2 diabetes mellitus with diabetic peripheral angiopathy without gangrene: Secondary | ICD-10-CM

## 2024-06-13 DIAGNOSIS — M79676 Pain in unspecified toe(s): Secondary | ICD-10-CM | POA: Diagnosis not present

## 2024-06-13 DIAGNOSIS — B351 Tinea unguium: Secondary | ICD-10-CM

## 2024-06-16 ENCOUNTER — Encounter: Payer: Self-pay | Admitting: Podiatry

## 2024-06-16 NOTE — Progress Notes (Signed)
  Subjective:  Patient ID: Jorge Jefferson, male    DOB: 20-May-1946,  MRN: [id number]  Jorge Jefferson presents to clinic today for at risk foot care. Pt has h/o NIDDM with PAD and painful mycotic toenails of both feet that are difficult to trim. Pain interferes with daily activities and wearing enclosed shoe gear comfortably.  Chief Complaint  Patient presents with   RFC    Rm17 Routine foot care/ Dr. Elna Potters last visit March 24,2025   New problem(s): None.   PCP is Potters Elna, MD.  No Known Allergies  Review of Systems: Negative except as noted in the HPI.  Objective: No changes noted in today's physical examination. There were no vitals filed for this visit. Jorge Jefferson is a pleasant 78 y.o. male in NAD. AAO x 3.  Vascular Examination: CFT <4 seconds b/l. DP pulses diminished b/l. PT pulses diminished b/l. Digital hair absent. Skin temperature gradient warm to cool b/l. No ischemia or gangrene. No cyanosis or clubbing noted b/l. No edema noted b/l LE.   Neurological Examination: Sensation grossly intact b/l with 10 gram monofilament. Vibratory sensation intact b/l.   Dermatological Examination: Pedal skin thin, shiny and atrophic b/l. No open wounds. No interdigital macerations.   Toenails 1-5 b/l thick, discolored, elongated with subungual debris and pain on dorsal palpation.   No corns, calluses nor porokeratotic lesions noted.  Musculoskeletal Examination: Muscle strength 5/5 to all lower extremity muscle groups bilaterally. Hammertoe(s) 1-5 bilaterally.  Radiographs: None  Assessment/Plan: 1. Pain due to onychomycosis of toenail   2. Type II diabetes mellitus with peripheral circulatory disorder [facility])   Patient was evaluated and treated. All patient's and/or POA's questions/concerns addressed on today's visit. Mycotic toenails 1-5 debrided in length and girth without incident.  Treatment was provided by assistant Mable FABIENE Cherry under my supervision.  Continue daily foot inspections and monitor blood glucose per PCP/Endocrinologist's recommendations.Continue soft, supportive shoe gear daily. Report any pedal injuries to medical professional. Call office if there are any quesitons/concerns.  Return in about 3 months (around 09/13/2024).  Jorge Jefferson, DPM      [place] LOCATION: [address] [place] [address]                   Office [phone]   [facility] LOCATION: [address] [place], [place] [address] Office [phone]

## 2024-06-26 ENCOUNTER — Ambulatory Visit (HOSPITAL_COMMUNITY): Attending: Podiatry

## 2024-06-26 ENCOUNTER — Encounter (HOSPITAL_COMMUNITY): Payer: Self-pay

## 2024-06-26 DIAGNOSIS — M25552 Pain in left hip: Secondary | ICD-10-CM | POA: Diagnosis not present

## 2024-06-26 DIAGNOSIS — Z7409 Other reduced mobility: Secondary | ICD-10-CM | POA: Diagnosis not present

## 2024-06-26 DIAGNOSIS — M25551 Pain in right hip: Secondary | ICD-10-CM | POA: Diagnosis not present

## 2024-06-26 DIAGNOSIS — R29898 Other symptoms and signs involving the musculoskeletal system: Secondary | ICD-10-CM | POA: Diagnosis not present

## 2024-06-26 NOTE — Therapy (Signed)
 OUTPATIENT PHYSICAL THERAPY LOWER EXTREMITY TREATMENT   Patient Name: Jorge Jefferson MRN: [id number] DOB:02/14/1946, 78 y.o., male Today's Date: 06/26/2024  END OF SESSION:  PT End of Session - 06/26/24 0852     Visit Number 4    Number of Visits 12    Date for PT Re-Evaluation 06/15/24    Authorization Type [employer] Medicare    Authorization Time Period 6 visits approved 7/17-8/28    Authorization - Visit Number 4    Authorization - Number of Visits 6    Progress Note Due on Visit 6    PT Start Time [phone]    PT Stop Time 0930    PT Time Calculation (min) 38 min    Equipment Utilized During Treatment Gait belt    Activity Tolerance Patient tolerated treatment well    Behavior During Therapy WFL for tasks assessed/performed            Past Medical History:  Diagnosis Date   Diabetes mellitus without complication (HCC)    Hearing loss    Hyperlipidemia    Hypertension    Peripheral arterial disease (HCC)    Renal disorder    Past Surgical History:  Procedure Laterality Date   ABDOMINAL AORTOGRAM W/LOWER EXTREMITY N/A 03/25/2023   Procedure: ABDOMINAL AORTOGRAM W/LOWER EXTREMITY;  Surgeon: Gretta Lonni JINNY, MD;  Location: [facility];  Service: Cardiovascular;  Laterality: N/A;   CATARACT EXTRACTION W/PHACO Left 11/05/2023   Procedure: CATARACT EXTRACTION PHACO AND INTRAOCULAR LENS PLACEMENT (IOC);  Surgeon: Harrie Agent, MD;  Location: [facility];  Service: Ophthalmology;  Laterality: Left;  CDE: 6.25   CATARACT EXTRACTION W/PHACO Right 11/19/2023   Procedure: CATARACT EXTRACTION PHACO AND INTRAOCULAR LENS PLACEMENT (IOC);  Surgeon: Harrie Agent, MD;  Location: [facility];  Service: Ophthalmology;  Laterality: Right;  CDE: 7.22   PERIPHERAL VASCULAR INTERVENTION  03/25/2023   Procedure: PERIPHERAL VASCULAR INTERVENTION;  Surgeon: Gretta Lonni JINNY, MD;  Location: [facility];  Service: Cardiovascular;;   Patient Active Problem List   Diagnosis Date Noted    Mild protein-calorie malnutrition (HCC) 03/28/2023   Perforation of right tympanic membrane 03/18/2023   Atherosclerosis of native arteries of extremity with intermittent claudication (HCC) 03/01/2023   Eustachian tube dysfunction, bilateral 01/25/2023   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) 02/09/2022   Constipation 11/07/2021   Screening for malignant neoplasm of colon 11/07/2021   Advanced hepatic fibrosis 09/17/2021   Elevated ferritin level 09/17/2021   Hyperlipidemia 09/17/2021   History of colonic polyps 09/17/2021   Steatosis of liver 09/17/2021   Bilateral sensorineural hearing loss 03/15/2014   Chronic eustachian tube dysfunction 03/15/2014   Essential hypertension 03/15/2014   Mixed hearing loss of right ear 03/15/2014   Nasal septal perforation 03/15/2014    PCP: Leigh Lung, MD  REFERRING PROVIDER: Silva Juliene SAUNDERS, DPM  REFERRING DIAG: R26.89 (ICD-10-CM) - Balance disorder 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 Patient has joint stiffness in left foot, balance issues, 1cm limb length shorter on R, would like to include ROM, strengthening, stability, and gait/balance training. Modalities PRN at therapist's discretion.  THERAPY DIAG:  Pain of both hip joints  Impaired functional mobility, balance, gait, and endurance  Leg weakness, bilateral  Rationale for Evaluation and Treatment: Rehabilitation  ONSET DATE: A few months   SUBJECTIVE:   SUBJECTIVE STATEMENT: Pt states he  feels his balance has gotten better. States he would like to continue therapy as he has not gotten into it really. Pt states he thinks its the cigarettes why he can not walk longer. Pt states he thinks he needs to come into therapy more often. Pts main concern is hip pain as it is limiting how far he can walk.  (Initial) Patient reports pain in  hips when he walks and balance issues. Hip pain has been going on for a few months and it comes and goes. Reports hip pain is sharp. Reports if he walks around distance of apartment complex, it starts to hurt. Reports balance issue has been going on for a little while longer than hip pain. Reports they put a stent in about a year ago and L leg is a little bit longer than R. Isn't sure if theres any correlation there.   PERTINENT HISTORY: PAD LE Stent placed 03/2023  PAIN:  Are you having pain? Yes: NPRS scale: None currently. When its there, 10/10 Pain location: Both hips Pain description: sharp pain  Aggravating factors: Walking Relieving factors: Standing and moving hips around   PRECAUTIONS: None  RED FLAGS: None   WEIGHT BEARING RESTRICTIONS: No  FALLS:  Has patient fallen in last 6 months? 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  PLOF: Independent  PATIENT GOALS: To not feel this pain. I want to be able to walk up around [place] like he used to.  NEXT MD VISIT: August 26th  OBJECTIVE:  Note: Objective measures were completed at Evaluation unless otherwise noted.  DIAGNOSTIC FINDINGS:    2023: FINDINGS: There is no evidence of hip fracture or dislocation. There is no evidence of arthropathy or other focal bone abnormality.   IMPRESSION: Negative.  PATIENT SURVEYS:  LEFS : Lower Extremity Functional Score: 48 / 80 = 60.0 %  ABC (05/08/24): 1200/1600 - 75%  06/26/24  LEFS: 44/80 1350 / 1600 = 84.4 %  COGNITION: Overall cognitive status: Within functional limits for tasks assessed     SENSATION: WFL  MUSCLE LENGTH: Hamstrings: Right ~110 bilat deg; Left ~110 bilat deg  -  moderate tightness during passive 90/90 test  POSTURE: rounded shoulders and  forward head  PALPATION:   LOWER EXTREMITY ROM:  Active ROM Right eval Left eval  Hip flexion    Hip extension    Hip abduction    Hip adduction    Hip internal rotation    Hip external rotation    Knee flexion    Knee extension    Ankle dorsiflexion    Ankle plantarflexion    Ankle inversion    Ankle eversion     (Blank rows = not tested)  LOWER EXTREMITY MMT:  MMT Right eval Left eval Right 06/26/24 Left 06/26/24  Hip flexion 4- 4- 4+ 4-, pain in left hip  Hip extension 3+ 3+ 3+ 3+  Hip abduction 4 4 4  4+  Hip adduction      Hip internal rotation      Hip external rotation      Knee flexion      Knee extension      Ankle dorsiflexion 5 5 5 5   Ankle plantarflexion      Ankle inversion      Ankle eversion       (  Blank rows = not tested)  LOWER EXTREMITY SPECIAL TESTS:  Hip special tests: Bel[place] (FABER) test: positive   FUNCTIONAL TESTS:  30 seconds chair stand test: 10.5 STS, no UE  4 Stage Balance: Side by side: 30, very mild swaying Semi tandem: 30 each foot leading, mild swaying Full tandem: unable to with RLE leading, L foot: 3 Single Leg stance: R foot - Unable, L foot: 3  DGI (performed 05/08/24) 1. Gait level surface (2) Mild Impairment: Walks 20', uses assistive devices, slower speed, mild gait deviations. 2. Change in gait speed (2) Mild Impairment: Is able to change speed but demonstrates mild gait deviations, or not gait deviations but unable to achieve a significant change in velocity, or uses an assistive device. 3. Gait with horizontal head turns (1) Moderate Impairment: Performs head turns with moderate change in gait velocity, slows down, staggers but recovers, can continue to walk. 4. Gait with vertical head turns 1) Moderate Impairment: Performs head turns with moderate change in gait velocity, slows down, staggers but recovers, can continue to walk. 5. Gait and pivot turn (2) Mild Impairment: Pivot turns safely in > 3 seconds and  stops with no loss of balance. 6. Step over obstacle (2) Mild Impairment: Is able to step over box, but must slow down and adjust steps to clear box safely. 7. Step around obstacles (2) Mild Impairment: Is able to step around both cones, but must slow down and adjust steps to clear cones. 8. Stairs (2) Mild Impairment: Alternating feet, must use rail.  TOTAL SCORE: 14 / 24   06/26/24: 30 Second Chair stand test: 9.5 Left foot in front 28.37 s Right foot in front 15.05 s SLS 06/26/24: R: less than half a second L: 4s  GAIT: Distance walked: 100 Assistive device utilized: None Level of assistance: Complete Independence Comments: Mild limp at times (slight leg length discrepancy), L leg demo inc flexion throughout cycle,                                                                                                                                 TREATMENT DATE:  06/26/2024  Progress note: strength assessed, LEFS, ABC scale, balance assessed, reviewed HEP -Forward lunges on bosu ball, 1 set of 8 reps better performance going into LLE, pt cued for core activation and upright posture -Tandem stance trials, 3 reps, 30 seconds bilaterally -SLS trials, 30 seconds, 1 rep bilaterally -Sit to stands, 2 sets of 10 reps, pt cued for core activation   06/01/24 Standing:  in // bars with bil UE assist heelraises 20X  4 lateral step ups 10X each  6 forward step ups 10X each  6 forward step downs 10X each  Tandem stance 30 X 3 each LE leading  6 forward lunges intermittent HHA  SLS 2 max either LE  Hip abduction 10X each  Hip extension 10X each Sidestepping on 10 line 3RT with cues for upright  posturing Sit to stand with 2 blue foam in chair no UE assist 6X Nustep seat 17 UE/LE level 4 [place] 5 minutes  05/08/24 - ABC scoring/assessment NM - DGI scoring/assessment TE - Bilateral knees to chest with ball under feet for improving functional (20 reps) - LTR with control as  needed (15 reps) - HS stretching seated TA - Sit to stand 2 x 10 repetitions - rest break between required and cues to push through heels to engage gluts - Step overs with 4 block and reverse step x 10 on each side w/ cuing for knee alignment and requiring BUE support // bars   PATIENT EDUCATION:  Education details: PT evaluation, objective findings, POC, Importance of HEP, Precautions, Clinic policies Person educated: Patient Education method: Explanation and Demonstration Education comprehension: verbalized understanding and returned demonstration  HOME EXERCISE PROGRAM: Access Code: [email or web address] URL: [email or web address] Date: 05/04/2024 Prepared by: Rosaria Powell-Butler  Exercises - Seated Hamstring Stretch  - 2 x daily - 7 x weekly - 3 sets - 30 hold - Sit to Stand  - 2 x daily - 7 x weekly - 3 sets - 10 reps - Supine Bridge  - 2 x daily - 7 x weekly - 3 sets - 10 reps - Standing Tandem Balance with Counter Support  - 2 x daily - 7 x weekly - 3 sets - 30 hold  ASSESSMENT:  CLINICAL IMPRESSION: Patient continues to demonstrate increased hip pain, decreased BLE strength, decreased gait quality and improved balance. Patient also demonstrates improved perception of balance and meeting tandem stance balance goal set at the beginning of therapy. Patient able to progress dynamic balance and core activation exercises today with lunge variation, good performance with verbal cueing. Pt progress likely limited due to only having had 3 treatment session due to gout flare up, only 2/7 goals met at this date. Patient would continue to benefit from skilled physical therapy for increased endurance with ambulation, increased LE strength, and improved balance for improved quality of life, improved independence with gait training and continued progress towards therapy goals. Plan to extend for 6 weeks at 2 visits per week for increased progress towards remaining therapy goals.     (Initial) Patient is a 78 y.o. 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 On this date, patient demonstrates dec LE strength, impaired balance, altered gait, dec endurance, and inc pain. Hip pain is reproduced on R hip only during FABER testing. Patient also demo moderate tightness in hamstrings bilaterally. Patient will benefit from continued skilled physical therapy in order to address the above to improve his function and quality of life.    OBJECTIVE IMPAIRMENTS: Abnormal gait, decreased activity tolerance, decreased balance, decreased coordination, decreased endurance, decreased ROM, decreased strength, improper body mechanics, postural dysfunction, and pain.   ACTIVITY LIMITATIONS: lifting, bending, sitting, standing, squatting, stairs, and transfers  PARTICIPATION LIMITATIONS: meal prep, cleaning, laundry, community activity, and yard work  PERSONAL FACTORS: N/A are also affecting patient's functional outcome.   REHAB POTENTIAL: Good  CLINICAL DECISION MAKING: Stable/uncomplicated  EVALUATION COMPLEXITY: Low   GOALS: Goals reviewed with patient? No  SHORT TERM GOALS: Target date: 05/18/24 Patient will be independent with performance of HEP to demonstrate adequate self management of symptoms.  Baseline:  Goal status: Progressing  2.   Patient will report at least a 25% improvement with function or pain overall since beginning PT. Baseline: 10% reported  06/26/24 Goal status: PROGRESSING   LONG TERM GOALS: Target date: 06/15/24  Patient will improve LEFS by at least 9 points to demo improved self perceived function while meeting MCID. Baseline:  Goal status: PROGRESSING  2.  Patient will improve hip ext MMT to at least 4/5 to demonstrate to improved LE strength needed for functional transfer. Baseline:  Goal status: PROGRESSING  3.  Patient will  improve 30 second chair stand by at least 3 STS in order to demonstrate improved LE endurance needed for community ambulation. Baseline:  Goal status: PROGRESSING  4.  Patient will maintain tandem balance for at least 10 seconds with each LE leading to demonstrate improved overall balance to decrease fall risk.  Baseline:  Goal status: MET  5. Patient will reduce falls risk as indicated by Activities Specific Balance Confidence Scale (ABC) >67%.  Baseline:   Goal status: MET   PLAN:  PT FREQUENCY: 2x/week  PT DURATION: 6 weeks  PLANNED INTERVENTIONS: 97164- PT Re-evaluation, 97110-Therapeutic exercises, 97530- Therapeutic activity, [id number]- Neuromuscular re-education, 97535- Self Care, [id number]- Manual therapy, [id number]- Gait training, [phone]- Electrical stimulation (manual), [id number]- Traction (mechanical), 20560 (1-2 muscles), 20561 (3+ muscles)- Dry Needling, Patient/Family education, Balance training, Stair training, Taping, Joint mobilization, Spinal mobilization, Cryotherapy, and Moist heat  PLAN FOR NEXT SESSION: progress LE strengthening, core strengthening, progress hip mobility and balance. Requesting additional 6 weeks, 2 visits per week.   Lang Ada, PT, DPT [facility] Office: [phone] 10:47 AM, 06/26/24  [facility] Medicare Auth Request Information Treatment Start Date: 05/08/24  Date of referral: 03/30/24 Referring provider:   Silva Juliene SAUNDERS, DPM   Referring diagnosis (ICD 10)?  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 (if different than referring diagnosis) M25.551, M25.552; Z74.09; R29.898   What was this (referring dx) caused by? Ongoing Issue  Lysle of Condition: Recurrent (multiple episodes of < 3 months)   Laterality: Both  Current Functional Measure Score: LEFS: 44/80  Objective measurements identify impairments when they are compared to normal values,  the uninvolved extremity, and prior level of function.  [x]  Yes  []  No  Objective assessment of functional ability: Moderate functional limitations   Briefly describe symptoms: hip pain/weakness, impaired balance  How did symptoms start: age related  Average pain intensity:  Last 24 hours: none reported  Past week: none reported  How often does the pt experience symptoms? Frequently  How much have the symptoms interfered with usual daily activities? Moderately  How has condition changed since care began at this facility? A little better  In general, how is the patients overall health? Fair   BACK PAIN (STarT Back Screening Tool) No

## 2024-06-28 ENCOUNTER — Encounter (HOSPITAL_COMMUNITY): Payer: Self-pay

## 2024-06-28 ENCOUNTER — Ambulatory Visit (HOSPITAL_COMMUNITY)

## 2024-06-28 DIAGNOSIS — Z7409 Other reduced mobility: Secondary | ICD-10-CM

## 2024-06-28 DIAGNOSIS — M25551 Pain in right hip: Secondary | ICD-10-CM | POA: Diagnosis not present

## 2024-06-28 DIAGNOSIS — M25552 Pain in left hip: Secondary | ICD-10-CM

## 2024-06-28 DIAGNOSIS — R29898 Other symptoms and signs involving the musculoskeletal system: Secondary | ICD-10-CM | POA: Diagnosis not present

## 2024-06-28 NOTE — Therapy (Signed)
 OUTPATIENT PHYSICAL THERAPY LOWER EXTREMITY TREATMENT   Patient Name: Jorge Jefferson MRN: [id number] DOB:1946-02-21, 78 y.o., male Today's Date: 06/28/2024  END OF SESSION:  PT End of Session - 06/28/24 0847     Visit Number 5    Number of Visits 12    Date for PT Re-Evaluation 08/11/24    Authorization Type [employer] Medicare    Authorization Time Period uhc approved 4 visits from 06/26/24-07/24/24    Authorization - Visit Number 2    Authorization - Number of Visits 4    Progress Note Due on Visit 7    PT Start Time [phone]    PT Stop Time 0930    PT Time Calculation (min) 43 min    Equipment Utilized During Treatment Gait belt    Activity Tolerance Patient tolerated treatment well    Behavior During Therapy WFL for tasks assessed/performed            Past Medical History:  Diagnosis Date   Diabetes mellitus without complication (HCC)    Hearing loss    Hyperlipidemia    Hypertension    Peripheral arterial disease (HCC)    Renal disorder    Past Surgical History:  Procedure Laterality Date   ABDOMINAL AORTOGRAM W/LOWER EXTREMITY N/A 03/25/2023   Procedure: ABDOMINAL AORTOGRAM W/LOWER EXTREMITY;  Surgeon: Gretta Lonni JINNY, MD;  Location: [facility];  Service: Cardiovascular;  Laterality: N/A;   CATARACT EXTRACTION W/PHACO Left 11/05/2023   Procedure: CATARACT EXTRACTION PHACO AND INTRAOCULAR LENS PLACEMENT (IOC);  Surgeon: Harrie Agent, MD;  Location: [facility];  Service: Ophthalmology;  Laterality: Left;  CDE: 6.25   CATARACT EXTRACTION W/PHACO Right 11/19/2023   Procedure: CATARACT EXTRACTION PHACO AND INTRAOCULAR LENS PLACEMENT (IOC);  Surgeon: Harrie Agent, MD;  Location: [facility];  Service: Ophthalmology;  Laterality: Right;  CDE: 7.22   PERIPHERAL VASCULAR INTERVENTION  03/25/2023   Procedure: PERIPHERAL VASCULAR INTERVENTION;  Surgeon: Gretta Lonni JINNY, MD;  Location: [facility];  Service: Cardiovascular;;   Patient Active Problem List    Diagnosis Date Noted   Mild protein-calorie malnutrition (HCC) 03/28/2023   Perforation of right tympanic membrane 03/18/2023   Atherosclerosis of native arteries of extremity with intermittent claudication (HCC) 03/01/2023   Eustachian tube dysfunction, bilateral 01/25/2023   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) 02/09/2022   Constipation 11/07/2021   Screening for malignant neoplasm of colon 11/07/2021   Advanced hepatic fibrosis 09/17/2021   Elevated ferritin level 09/17/2021   Hyperlipidemia 09/17/2021   History of colonic polyps 09/17/2021   Steatosis of liver 09/17/2021   Bilateral sensorineural hearing loss 03/15/2014   Chronic eustachian tube dysfunction 03/15/2014   Essential hypertension 03/15/2014   Mixed hearing loss of right ear 03/15/2014   Nasal septal perforation 03/15/2014    PCP: Leigh Lung, MD  REFERRING PROVIDER: Silva Juliene SAUNDERS, DPM  REFERRING DIAG: R26.89 (ICD-10-CM) - Balance disorder 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 Patient has joint stiffness in left foot, balance issues, 1cm limb length shorter on R, would like to include ROM, strengthening, stability, and gait/balance training. Modalities PRN at therapist's discretion.  THERAPY DIAG:  Pain of both hip joints  Impaired functional mobility, balance, gait, and endurance  Leg weakness, bilateral  Rationale for Evaluation and Treatment: Rehabilitation  ONSET DATE: A few months   SUBJECTIVE:   SUBJECTIVE STATEMENT: Pt  stated he is feeling good today.  No reports of pain.  Reports he has been compliant with exercises, stated too difficult to complete bridges at home.  Feels a little stiff this morning.    (Initial) Patient reports pain in hips when he walks and balance issues. Hip pain has been going on for a few months and it comes and goes.  Reports hip pain is sharp. Reports if he walks around distance of apartment complex, it starts to hurt. Reports balance issue has been going on for a little while longer than hip pain. Reports they put a stent in about a year ago and L leg is a little bit longer than R. Isn't sure if theres any correlation there.   PERTINENT HISTORY: PAD LE Stent placed 03/2023  PAIN:  Are you having pain? Yes: NPRS scale: None currently. When its there, 10/10 Pain location: Both hips Pain description: sharp pain  Aggravating factors: Walking Relieving factors: Standing and moving hips around   PRECAUTIONS: None  RED FLAGS: None   WEIGHT BEARING RESTRICTIONS: No  FALLS:  Has patient fallen in last 6 months? 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  PLOF: Independent  PATIENT GOALS: To not feel this pain. I want to be able to walk up around [place] like he used to.  NEXT MD VISIT: August 26th  OBJECTIVE:  Note: Objective measures were completed at Evaluation unless otherwise noted.  DIAGNOSTIC FINDINGS:    2023: FINDINGS: There is no evidence of hip fracture or dislocation. There is no evidence of arthropathy or other focal bone abnormality.   IMPRESSION: Negative.  PATIENT SURVEYS:  LEFS : Lower Extremity Functional Score: 48 / 80 = 60.0 %  ABC (05/08/24): 1200/1600 - 75%  06/26/24  LEFS: 44/80 1350 / 1600 = 84.4 %  COGNITION: Overall cognitive status: Within functional limits for tasks assessed     SENSATION: WFL  MUSCLE LENGTH: Hamstrings: Right ~110 bilat deg; Left ~110 bilat deg  -  moderate tightness during passive 90/90 test  POSTURE: rounded shoulders and forward head  PALPATION:   LOWER EXTREMITY ROM:  Active ROM Right eval Left eval  Hip flexion     Hip extension    Hip abduction    Hip adduction    Hip internal rotation    Hip external rotation    Knee flexion    Knee extension    Ankle dorsiflexion    Ankle plantarflexion    Ankle inversion    Ankle eversion     (Blank rows = not tested)  LOWER EXTREMITY MMT:  MMT Right eval Left eval Right 06/26/24 Left 06/26/24  Hip flexion 4- 4- 4+ 4-, pain in left hip  Hip extension 3+ 3+ 3+ 3+  Hip abduction 4 4 4  4+  Hip adduction      Hip internal rotation      Hip external rotation      Knee flexion      Knee extension      Ankle dorsiflexion 5 5 5 5   Ankle plantarflexion      Ankle inversion      Ankle eversion       (Blank rows = not tested)  LOWER EXTREMITY SPECIAL TESTS:  Hip special tests: [place] (FABER) test: positive   FUNCTIONAL TESTS:  30 seconds  chair stand test: 10.5 STS, no UE  4 Stage Balance: Side by side: 30, very mild swaying Semi tandem: 30 each foot leading, mild swaying Full tandem: unable to with RLE leading, L foot: 3 Single Leg stance: R foot - Unable, L foot: 3  DGI (performed 05/08/24) 1. Gait level surface (2) Mild Impairment: Walks 20', uses assistive devices, slower speed, mild gait deviations. 2. Change in gait speed (2) Mild Impairment: Is able to change speed but demonstrates mild gait deviations, or not gait deviations but unable to achieve a significant change in velocity, or uses an assistive device. 3. Gait with horizontal head turns (1) Moderate Impairment: Performs head turns with moderate change in gait velocity, slows down, staggers but recovers, can continue to walk. 4. Gait with vertical head turns 1) Moderate Impairment: Performs head turns with moderate change in gait velocity, slows down, staggers but recovers, can continue to walk. 5. Gait and pivot turn (2) Mild Impairment: Pivot turns safely in > 3 seconds and stops with no loss of balance. 6. Step over obstacle (2) Mild Impairment: Is able to step over box, but  must slow down and adjust steps to clear box safely. 7. Step around obstacles (2) Mild Impairment: Is able to step around both cones, but must slow down and adjust steps to clear cones. 8. Stairs (2) Mild Impairment: Alternating feet, must use rail.  TOTAL SCORE: 14 / 24   06/26/24: 30 Second Chair stand test: 9.5 Left foot in front 28.37 s Right foot in front 15.05 s SLS 06/26/24: R: less than half a second L: 4s  GAIT: Distance walked: 100 Assistive device utilized: None Level of assistance: Complete Independence Comments: Mild limp at times (slight leg length discrepancy), L leg demo inc flexion throughout cycle,                                                                                                                                 TREATMENT DATE:  06/28/24: STS no HHA 10x standard chair height with cueing for eccentric control Nustep UE/LE 5' [place] L3 resistance Standing inside // bars:  Forward 6in hurdles then 12in  Lateral step over 12 in hurdles 3RT   Toe tapping 6 in 2x 10 alternating with BUE then no UE support  Sidestep with GTB 2RT down black line  Tandem stance 2x 30 Seated hamstring stretch 2x 30 BLE  06/26/2024  Progress note: strength assessed, LEFS, ABC scale, balance assessed, reviewed HEP -Forward lunges on bosu ball, 1 set of 8 reps better performance going into LLE, pt cued for core activation and upright posture -Tandem stance trials, 3 reps, 30 seconds bilaterally -SLS trials, 30 seconds, 1 rep bilaterally -Sit to stands, 2 sets of 10 reps, pt cued for core activation   06/01/24 Standing:  in // bars with bil UE assist heelraises 20X  4 lateral step ups 10X each  6 forward step ups 10X  each  6 forward step downs 10X each  Tandem stance 30 X 3 each LE leading  6 forward lunges intermittent HHA  SLS 2 max either LE  Hip abduction 10X each  Hip extension 10X each Sidestepping on 10 line 3RT with cues for upright  posturing Sit to stand with 2 blue foam in chair no UE assist 6X Nustep seat 17 UE/LE level 4 [place] 5 minutes  05/08/24 - ABC scoring/assessment NM - DGI scoring/assessment TE - Bilateral knees to chest with ball under feet for improving functional (20 reps) - LTR with control as needed (15 reps) - HS stretching seated TA - Sit to stand 2 x 10 repetitions - rest break between required and cues to push through heels to engage gluts - Step overs with 4 block and reverse step x 10 on each side w/ cuing for knee alignment and requiring BUE support // bars   PATIENT EDUCATION:  Education details: PT evaluation, objective findings, POC, Importance of HEP, Precautions, Clinic policies Person educated: Patient Education method: Explanation and Demonstration Education comprehension: verbalized understanding and returned demonstration  HOME EXERCISE PROGRAM: Access Code: [email or web address] URL: [email or web address] Date: 05/04/2024 Prepared by: Rosaria Powell-Butler  Exercises - Seated Hamstring Stretch  - 2 x daily - 7 x weekly - 3 sets - 30 hold - Sit to Stand  - 2 x daily - 7 x weekly - 3 sets - 10 reps - Supine Bridge  - 2 x daily - 7 x weekly - 3 sets - 10 reps - Standing Tandem Balance with Counter Support  - 2 x daily - 7 x weekly - 3 sets - 30 hold  06/28/24: - Side Stepping with Resistance at Thighs and Counter Support  - 1 x daily - 7 x weekly - 3 sets - 10 reps  ASSESSMENT:  CLINICAL IMPRESSION: Session focus with LE strengthening and balance training.  Added hurdles to improve SLS and hip strengthening, pt able to complete 6in with minimal difficulty and progressed to 12in height with some instability noted and intermittent HHA.  Reviewed compliance with HEP currently, pt stated minimal compliance with bed exercises as too difficulty to complete on bed at home.  Reviewed importance of HEP compliance and discussed increased compliance with standing exercises.  Added  sidestep with theraband around thighs to HEP, printer jammed so unable to give copy, please give next session.  (Initial) Patient is a 78 y.o. 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 On this date, patient demonstrates dec LE strength, impaired balance, altered gait, dec endurance, and inc pain. Hip pain is reproduced on R hip only during FABER testing. Patient also demo moderate tightness in hamstrings bilaterally. Patient will benefit from continued skilled physical therapy in order to address the above to improve his function and quality of life.    OBJECTIVE IMPAIRMENTS: Abnormal gait, decreased activity tolerance, decreased balance, decreased coordination, decreased endurance, decreased ROM, decreased strength, improper body mechanics, postural dysfunction, and pain.   ACTIVITY LIMITATIONS: lifting, bending, sitting, standing, squatting, stairs, and transfers  PARTICIPATION LIMITATIONS: meal prep, cleaning, laundry, community activity, and yard work  PERSONAL FACTORS: N/A are also affecting patient's functional outcome.   REHAB POTENTIAL: Good  CLINICAL DECISION MAKING: Stable/uncomplicated  EVALUATION COMPLEXITY: Low   GOALS: Goals reviewed with patient? No  SHORT TERM GOALS: Target date: 05/18/24 Patient will be independent with performance of HEP to demonstrate adequate  self management of symptoms.  Baseline:  Goal status: Progressing  2.   Patient will report at least a 25% improvement with function or pain overall since beginning PT. Baseline: 10% reported 06/26/24 Goal status: PROGRESSING   LONG TERM GOALS: Target date: 06/15/24  Patient will improve LEFS by at least 9 points to demo improved self perceived function while meeting MCID. Baseline:  Goal status: PROGRESSING  2.  Patient will improve hip ext MMT to at least 4/5 to demonstrate to  improved LE strength needed for functional transfer. Baseline:  Goal status: PROGRESSING  3.  Patient will improve 30 second chair stand by at least 3 STS in order to demonstrate improved LE endurance needed for community ambulation. Baseline:  Goal status: PROGRESSING  4.  Patient will maintain tandem balance for at least 10 seconds with each LE leading to demonstrate improved overall balance to decrease fall risk.  Baseline:  Goal status: MET  5. Patient will reduce falls risk as indicated by Activities Specific Balance Confidence Scale (ABC) >67%.  Baseline:   Goal status: MET   PLAN:  PT FREQUENCY: 2x/week  PT DURATION: 6 weeks  PLANNED INTERVENTIONS: [id number]- PT Re-evaluation, 97110-Therapeutic exercises, 97530- Therapeutic activity, [id number]- Neuromuscular re-education, 97535- Self Care, [id number]- Manual therapy, [id number]- Gait training, [phone]- Electrical stimulation (manual), [id number]- Traction (mechanical), 20560 (1-2 muscles), [id number] (3+ muscles)- Dry Needling, Patient/Family education, Balance training, Stair training, Taping, Joint mobilization, Spinal mobilization, Cryotherapy, and Moist heat  PLAN FOR NEXT SESSION: progress LE strengthening, core strengthening, progress hip mobility and balance. Requesting additional 6 weeks, 2 visits per week.   Give pt copy of sidestep, printer down this session.  Augustin Mclean, LPTA/CLT; CBIS [phone]  4:56 PM, 06/28/24

## 2024-07-04 ENCOUNTER — Telehealth (HOSPITAL_COMMUNITY): Payer: Self-pay

## 2024-07-04 ENCOUNTER — Encounter (HOSPITAL_COMMUNITY)

## 2024-07-04 NOTE — Telephone Encounter (Signed)
 Pt was called concerning his missed 1PM appointment today. Pt states he tried to call earlier and cancel but phone was busy. Pt was reminded of next scheduled appointment and verbally confirms next appointment time. This is no show #1.  Lang Ada, PT, DPT [facility] Office: [phone] 1:41 PM, 07/04/24

## 2024-07-06 ENCOUNTER — Ambulatory Visit (HOSPITAL_COMMUNITY): Admitting: Physical Therapy

## 2024-07-06 DIAGNOSIS — M25552 Pain in left hip: Secondary | ICD-10-CM | POA: Diagnosis not present

## 2024-07-06 DIAGNOSIS — Z7409 Other reduced mobility: Secondary | ICD-10-CM

## 2024-07-06 DIAGNOSIS — R29898 Other symptoms and signs involving the musculoskeletal system: Secondary | ICD-10-CM

## 2024-07-06 DIAGNOSIS — M25551 Pain in right hip: Secondary | ICD-10-CM

## 2024-07-06 NOTE — Therapy (Signed)
 OUTPATIENT PHYSICAL THERAPY LOWER EXTREMITY TREATMENT   Patient Name: Jorge Jefferson MRN: [id number] DOB:[birth year], 78 y.o., male Today's Date: 07/06/2024  END OF SESSION:  PT End of Session - 07/06/24 0940     Visit Number 6    Number of Visits 12    Date for PT Re-Evaluation 08/11/24    Authorization Type [employer] Medicare    Authorization Time Period uhc approved 4 visits from 06/26/24-07/24/24    Authorization - Visit Number 3    Authorization - Number of Visits 4    Progress Note Due on Visit 7    PT Start Time 0935    PT Stop Time 1015    PT Time Calculation (min) 40 min    Equipment Utilized During Treatment Gait belt    Activity Tolerance Patient tolerated treatment well    Behavior During Therapy WFL for tasks assessed/performed             Past Medical History:  Diagnosis Date   Diabetes mellitus without complication (HCC)    Hearing loss    Hyperlipidemia    Hypertension    Peripheral arterial disease (HCC)    Renal disorder    Past Surgical History:  Procedure Laterality Date   ABDOMINAL AORTOGRAM W/LOWER EXTREMITY N/A 03/25/2023   Procedure: ABDOMINAL AORTOGRAM W/LOWER EXTREMITY;  Surgeon: Gretta Lonni JINNY, MD;  Location: [facility];  Service: Cardiovascular;  Laterality: N/A;   CATARACT EXTRACTION W/PHACO Left 11/05/2023   Procedure: CATARACT EXTRACTION PHACO AND INTRAOCULAR LENS PLACEMENT (IOC);  Surgeon: Harrie Agent, MD;  Location: [facility];  Service: Ophthalmology;  Laterality: Left;  CDE: 6.25   CATARACT EXTRACTION W/PHACO Right 11/19/2023   Procedure: CATARACT EXTRACTION PHACO AND INTRAOCULAR LENS PLACEMENT (IOC);  Surgeon: Harrie Agent, MD;  Location: [facility];  Service: Ophthalmology;  Laterality: Right;  CDE: 7.22   PERIPHERAL VASCULAR INTERVENTION  03/25/2023   Procedure: PERIPHERAL VASCULAR INTERVENTION;  Surgeon: Gretta Lonni JINNY, MD;  Location: [facility];  Service: Cardiovascular;;   Patient Active Problem List    Diagnosis Date Noted   Mild protein-calorie malnutrition (HCC) 03/28/2023   Perforation of right tympanic membrane 03/18/2023   Atherosclerosis of native arteries of extremity with intermittent claudication (HCC) 03/01/2023   Eustachian tube dysfunction, bilateral 01/25/2023   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) 02/09/2022   Constipation 11/07/2021   Screening for malignant neoplasm of colon 11/07/2021   Advanced hepatic fibrosis 09/17/2021   Elevated ferritin level 09/17/2021   Hyperlipidemia 09/17/2021   History of colonic polyps 09/17/2021   Steatosis of liver 09/17/2021   Bilateral sensorineural hearing loss 03/15/2014   Chronic eustachian tube dysfunction 03/15/2014   Essential hypertension 03/15/2014   Mixed hearing loss of right ear 03/15/2014   Nasal septal perforation 03/15/2014    PCP: Leigh Lung, MD  REFERRING PROVIDER: Silva Juliene SAUNDERS, DPM  REFERRING DIAG: R26.89 (ICD-10-CM) - Balance disorder 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 Patient has joint stiffness in left foot, balance issues, 1cm limb length shorter on R, would like to include ROM, strengthening, stability, and gait/balance training. Modalities PRN at therapist's discretion.  THERAPY DIAG:  Pain of both hip joints  Impaired functional mobility, balance, gait, and endurance  Leg weakness, bilateral  Rationale for Evaluation and Treatment: Rehabilitation  ONSET DATE: A few months   SUBJECTIVE:   SUBJECTIVE STATEMENT:  Pt stated he is having some back and leg pain today.     (Initial) Patient reports pain in hips when he walks and balance issues. Hip pain has been going on for a few months and it comes and goes. Reports hip pain is sharp. Reports if he walks around distance of apartment complex, it starts to hurt. Reports balance issue has been  going on for a little while longer than hip pain. Reports they put a stent in about a year ago and L leg is a little bit longer than R. Isn't sure if theres any correlation there.   PERTINENT HISTORY: PAD LE Stent placed 03/2023  PAIN:  Are you having pain? Yes: NPRS scale: 8/10 Pain location: Both hips Pain description: sharp pain  Aggravating factors: Walking Relieving factors: Standing and moving hips around   PRECAUTIONS: None  RED FLAGS: None   WEIGHT BEARING RESTRICTIONS: No  FALLS:  Has patient fallen in last 6 months? 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  PLOF: Independent  PATIENT GOALS: To not feel this pain. I want to be able to walk up around [place] like he used to.  NEXT MD VISIT: August 26th  OBJECTIVE:  Note: Objective measures were completed at Evaluation unless otherwise noted.  DIAGNOSTIC FINDINGS:    2023: FINDINGS: There is no evidence of hip fracture or dislocation. There is no evidence of arthropathy or other focal bone abnormality.   IMPRESSION: Negative.  PATIENT SURVEYS:  LEFS : Lower Extremity Functional Score: 48 / 80 = 60.0 %  ABC (05/08/24): 1200/1600 - 75%  06/26/24  LEFS: 44/80 1350 / 1600 = 84.4 %  COGNITION: Overall cognitive status: Within functional limits for tasks assessed     SENSATION: WFL  MUSCLE LENGTH: Hamstrings: Right ~110 bilat deg; Left ~110 bilat deg  -  moderate tightness during passive 90/90 test  POSTURE: rounded shoulders and forward head  PALPATION:    LOWER EXTREMITY MMT:  MMT Right eval Left eval Right 06/26/24 Left 06/26/24  Hip flexion 4- 4- 4+ 4-, pain in left hip  Hip extension 3+ 3+ 3+ 3+  Hip abduction 4 4 4  4+  Hip adduction      Hip internal rotation      Hip external  rotation      Knee flexion      Knee extension      Ankle dorsiflexion 5 5 5 5   Ankle plantarflexion      Ankle inversion      Ankle eversion       (Blank rows = not tested)  LOWER EXTREMITY SPECIAL TESTS:  Hip special tests: [place] (FABER) test: positive   FUNCTIONAL TESTS:  30 seconds chair stand test: 10.5 STS, no UE  4 Stage Balance: Side by side: 30, very mild swaying Semi tandem: 30 each foot leading, mild swaying Full tandem: unable to with RLE leading, L foot: 3 Single Leg stance: R foot - Unable, L foot: 3  DGI (performed 05/08/24) 1. Gait level surface (2) Mild Impairment: Walks 20', uses assistive devices, slower speed, mild gait deviations. 2. Change in gait speed (2) Mild Impairment: Is able to change speed but demonstrates mild gait deviations, or not gait deviations but unable to achieve a significant change in velocity, or uses an assistive device. 3. Gait with  horizontal head turns (1) Moderate Impairment: Performs head turns with moderate change in gait velocity, slows down, staggers but recovers, can continue to walk. 4. Gait with vertical head turns 1) Moderate Impairment: Performs head turns with moderate change in gait velocity, slows down, staggers but recovers, can continue to walk. 5. Gait and pivot turn (2) Mild Impairment: Pivot turns safely in > 3 seconds and stops with no loss of balance. 6. Step over obstacle (2) Mild Impairment: Is able to step over box, but must slow down and adjust steps to clear box safely. 7. Step around obstacles (2) Mild Impairment: Is able to step around both cones, but must slow down and adjust steps to clear cones. 8. Stairs (2) Mild Impairment: Alternating feet, must use rail.  TOTAL SCORE: 14 / 24   06/26/24: 30 Second Chair stand test: 9.5 Left foot in front 28.37 s Right foot in front 15.05 s SLS 06/26/24: R: less than half a second L: 4s  GAIT: Distance walked: 100 Assistive device utilized: None Level of  assistance: Complete Independence Comments: Mild limp at times (slight leg length discrepancy), L leg demo inc flexion throughout cycle,                                                                                                                                 TREATMENT DATE:  06/28/24: Nustep UE/LE 5' [place] L5 resistance, seat 15 and UE Standing inside // bars:  Toe tapping 6 in X10 alternating with BUE then  X10 no UE support  Tandem stance 2x 30  Slant board stretch 2X30 Seated hamstring stretch 2x 30 BLE STS no HHA 10x standard chair height with cueing for eccentric control Forward 2X6 and 2X12 hurdles on 60ft line, 2RT step to and over SBA Lateral step over 2X6 and 2X12 hurdles on 42ft line, 2RT step to and over SBA  Standing lumbar extension, hands on hips 5X  06/28/24: STS no HHA 10x standard chair height with cueing for eccentric control Nustep UE/LE 5' [place] L3 resistance Standing inside // bars:  Forward 6in hurdles then 12in  Lateral step over 12 in hurdles 3RT   Toe tapping 6 in 2x 10 alternating with BUE then no UE support  Sidestep with GTB 2RT down black line  Tandem stance 2x 30 Seated hamstring stretch 2x 30 BLE  06/26/2024  Progress note: strength assessed, LEFS, ABC scale, balance assessed, reviewed HEP -Forward lunges on bosu ball, 1 set of 8 reps better performance going into LLE, pt cued for core activation and upright posture -Tandem stance trials, 3 reps, 30 seconds bilaterally -SLS trials, 30 seconds, 1 rep bilaterally -Sit to stands, 2 sets of 10 reps, pt cued for core activation   06/01/24 Standing:  in // bars with bil UE assist heelraises 20X  4 lateral step ups 10X each  6 forward step ups 10X each  6 forward step downs 10X each  Tandem stance 30 X 3 each LE leading  6 forward lunges intermittent HHA  SLS 2 max either LE  Hip abduction 10X each  Hip extension 10X each Sidestepping on 10 line 3RT with cues for  upright posturing Sit to stand with 2 blue foam in chair no UE assist 6X Nustep seat 17 UE/LE level 4 [place] 5 minutes  05/08/24 - ABC scoring/assessment NM - DGI scoring/assessment TE - Bilateral knees to chest with ball under feet for improving functional (20 reps) - LTR with control as needed (15 reps) - HS stretching seated TA - Sit to stand 2 x 10 repetitions - rest break between required and cues to push through heels to engage gluts - Step overs with 4 block and reverse step x 10 on each side w/ cuing for knee alignment and requiring BUE support // bars   PATIENT EDUCATION:  Education details: PT evaluation, objective findings, POC, Importance of HEP, Precautions, Clinic policies Person educated: Patient Education method: Explanation and Demonstration Education comprehension: verbalized understanding and returned demonstration  HOME EXERCISE PROGRAM: Access Code: [email or web address] URL: [email or web address] Date: 05/04/2024 Prepared by: Rosaria Powell-Butler  Exercises - Seated Hamstring Stretch  - 2 x daily - 7 x weekly - 3 sets - 30 hold - Sit to Stand  - 2 x daily - 7 x weekly - 3 sets - 10 reps - Supine Bridge  - 2 x daily - 7 x weekly - 3 sets - 10 reps - Standing Tandem Balance with Counter Support  - 2 x daily - 7 x weekly - 3 sets - 30 hold  06/28/24: - Side Stepping with Resistance at Thighs and Counter Support  - 1 x daily - 7 x weekly - 3 sets - 10 reps  ASSESSMENT:  CLINICAL IMPRESSION: Continued focus on LE strengthening and balance.  Completed hurdles outside // bars to challenge stabilization. Initially with slight LOB due to difficulty clearing 12 hurdles, however pt able to self correct these.  Pt was not able to complete reciprocally, had to complete with step to.  Pt then c/o LB discomfort and LE pain following due to leaning forward to see hurdles (pt is very tall).  Pt did require 2 short seated rest breaks during session today due to  fatigue and pain. Pt with complaints, grunts/groans throughout session today with all activities. Encouraged to do standing lumbar extension as this tends to ease pain.   Added slant board stretch as noted stiffness and discomfort into calves bilaterally.  Cues for form, posture and holds throughout session today. Pt continues with some of his HEP but admits to not doing all of these.  Pt given copy of sidestep activity to do at home today.  (Initial) Patient is a 78 y.o. 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 On this date, patient demonstrates dec LE strength, impaired balance, altered gait, dec endurance, and inc pain. Hip pain is reproduced on R hip only during FABER testing. Patient also demo moderate tightness in hamstrings bilaterally. Patient will benefit from continued skilled physical therapy in order to address the above to improve his function and quality of life.    OBJECTIVE IMPAIRMENTS: Abnormal gait, decreased activity tolerance, decreased balance, decreased coordination, decreased endurance, decreased ROM, decreased strength, improper body mechanics, postural dysfunction, and pain.   ACTIVITY LIMITATIONS: lifting, bending, sitting, standing, squatting, stairs, and transfers  PARTICIPATION LIMITATIONS: meal prep,  cleaning, laundry, community activity, and yard work  PERSONAL FACTORS: N/A are also affecting patient's functional outcome.   REHAB POTENTIAL: Good  CLINICAL DECISION MAKING: Stable/uncomplicated  EVALUATION COMPLEXITY: Low   GOALS: Goals reviewed with patient? No  SHORT TERM GOALS: Target date: 05/18/24 Patient will be independent with performance of HEP to demonstrate adequate self management of symptoms.  Baseline:  Goal status: Progressing  2.   Patient will report at least a 25% improvement with function or pain overall since beginning  PT. Baseline: 10% reported 06/26/24 Goal status: PROGRESSING   LONG TERM GOALS: Target date: 06/15/24  Patient will improve LEFS by at least 9 points to demo improved self perceived function while meeting MCID. Baseline:  Goal status: PROGRESSING  2.  Patient will improve hip ext MMT to at least 4/5 to demonstrate to improved LE strength needed for functional transfer. Baseline:  Goal status: PROGRESSING  3.  Patient will improve 30 second chair stand by at least 3 STS in order to demonstrate improved LE endurance needed for community ambulation. Baseline:  Goal status: PROGRESSING  4.  Patient will maintain tandem balance for at least 10 seconds with each LE leading to demonstrate improved overall balance to decrease fall risk.  Baseline:  Goal status: MET  5. Patient will reduce falls risk as indicated by Activities Specific Balance Confidence Scale (ABC) >67%.  Baseline:   Goal status: MET   PLAN:  PT FREQUENCY: 2x/week  PT DURATION: 6 weeks  PLANNED INTERVENTIONS: 97164- PT Re-evaluation, 97110-Therapeutic exercises, 97530- Therapeutic activity, [id number]- Neuromuscular re-education, 97535- Self Care, [id number]- Manual therapy, [id number]- Gait training, [phone]- Electrical stimulation (manual), [id number]- Traction (mechanical), 20560 (1-2 muscles), 20561 (3+ muscles)- Dry Needling, Patient/Family education, Balance training, Stair training, Taping, Joint mobilization, Spinal mobilization, Cryotherapy, and Moist heat  PLAN FOR NEXT SESSION: progress LE strengthening, core strengthening, progress hip mobility and balance.  Greig KATHEE Fuse, PTA/CLT [facility] Ph: [phone] 9:41 AM, 07/06/24

## 2024-07-11 ENCOUNTER — Ambulatory Visit (HOSPITAL_COMMUNITY)

## 2024-07-11 ENCOUNTER — Encounter (HOSPITAL_COMMUNITY): Payer: Self-pay

## 2024-07-11 DIAGNOSIS — M25552 Pain in left hip: Secondary | ICD-10-CM | POA: Diagnosis not present

## 2024-07-11 DIAGNOSIS — M25551 Pain in right hip: Secondary | ICD-10-CM

## 2024-07-11 DIAGNOSIS — R29898 Other symptoms and signs involving the musculoskeletal system: Secondary | ICD-10-CM | POA: Diagnosis not present

## 2024-07-11 DIAGNOSIS — Z7409 Other reduced mobility: Secondary | ICD-10-CM

## 2024-07-11 NOTE — Therapy (Signed)
 OUTPATIENT PHYSICAL THERAPY LOWER EXTREMITY TREATMENT  Progress Note Reporting Period 06/26/24 to 07/11/24  See note below for Objective Data and Assessment of Progress/Goals.     Patient Name: Jorge Jefferson MRN: [id number] DOB:Jan 24, 1946, 78 y.o., male Today's Date: 07/11/2024  END OF SESSION:  PT End of Session - 07/11/24 0946     Visit Number 7    Number of Visits 12    Date for Recertification  08/11/24    Authorization Type [employer] Medicare    Authorization Time Period uhc approved 4 visits from 06/26/24-07/24/24    Authorization - Visit Number 4    Authorization - Number of Visits 4    Progress Note Due on Visit 7    PT Start Time [phone]    PT Stop Time 1025    PT Time Calculation (min) 38 min    Equipment Utilized During Treatment Gait belt    Activity Tolerance Patient tolerated treatment well    Behavior During Therapy WFL for tasks assessed/performed              Past Medical History:  Diagnosis Date   Diabetes mellitus without complication (HCC)    Hearing loss    Hyperlipidemia    Hypertension    Peripheral arterial disease    Renal disorder    Past Surgical History:  Procedure Laterality Date   ABDOMINAL AORTOGRAM W/LOWER EXTREMITY N/A 03/25/2023   Procedure: ABDOMINAL AORTOGRAM W/LOWER EXTREMITY;  Surgeon: Gretta Lonni JINNY, MD;  Location: [facility];  Service: Cardiovascular;  Laterality: N/A;   CATARACT EXTRACTION W/PHACO Left 11/05/2023   Procedure: CATARACT EXTRACTION PHACO AND INTRAOCULAR LENS PLACEMENT (IOC);  Surgeon: Harrie Agent, MD;  Location: [facility];  Service: Ophthalmology;  Laterality: Left;  CDE: 6.25   CATARACT EXTRACTION W/PHACO Right 11/19/2023   Procedure: CATARACT EXTRACTION PHACO AND INTRAOCULAR LENS PLACEMENT (IOC);  Surgeon: Harrie Agent, MD;  Location: [facility];  Service: Ophthalmology;  Laterality: Right;  CDE: 7.22   PERIPHERAL VASCULAR INTERVENTION  03/25/2023   Procedure: PERIPHERAL VASCULAR INTERVENTION;   Surgeon: Gretta Lonni JINNY, MD;  Location: [facility];  Service: Cardiovascular;;   Patient Active Problem List   Diagnosis Date Noted   Mild protein-calorie malnutrition 03/28/2023   Perforation of right tympanic membrane 03/18/2023   Atherosclerosis of native arteries of extremity with intermittent claudication 03/01/2023   Eustachian tube dysfunction, bilateral 01/25/2023   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) 02/09/2022   Constipation 11/07/2021   Screening for malignant neoplasm of colon 11/07/2021   Advanced hepatic fibrosis 09/17/2021   Elevated ferritin level 09/17/2021   Hyperlipidemia 09/17/2021   History of colonic polyps 09/17/2021   Steatosis of liver 09/17/2021   Bilateral sensorineural hearing loss 03/15/2014   Chronic eustachian tube dysfunction 03/15/2014   Essential hypertension 03/15/2014   Mixed hearing loss of right ear 03/15/2014   Nasal septal perforation 03/15/2014    PCP: Leigh Lung, MD  REFERRING PROVIDER: Silva Juliene SAUNDERS, DPM  REFERRING DIAG: R26.89 (ICD-10-CM) - Balance disorder 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 Patient has joint stiffness in left foot, balance issues, 1cm limb length shorter on R, would like to include ROM, strengthening, stability, and gait/balance training. Modalities PRN at therapist's discretion.  THERAPY DIAG:  Pain of both hip joints  Impaired functional mobility, balance, gait, and endurance  Leg weakness, bilateral  Rationale for Evaluation and Treatment: Rehabilitation  ONSET DATE: A few months   SUBJECTIVE:   SUBJECTIVE STATEMENT: Pt states that he thinks that his balance has gotten a little worse, just feels more off balance this week for some reason. Pt states that he does think the therapy is helping his stamina. Pt states he does a lot of  sitting at home.  Pt states that he feels he has improved about 70% from the beginning of therapy.    (Initial) Patient reports pain in hips when he walks and balance issues. Hip pain has been going on for a few months and it comes and goes. Reports hip pain is sharp. Reports if he walks around distance of apartment complex, it starts to hurt. Reports balance issue has been going on for a little while longer than hip pain. Reports they put a stent in about a year ago and L leg is a little bit longer than R. Isn't sure if theres any correlation there.   PERTINENT HISTORY: PAD LE Stent placed 03/2023  PAIN:  Are you having pain? Yes: NPRS scale: 8/10 Pain location: Both hips Pain description: sharp pain  Aggravating factors: Walking Relieving factors: Standing and moving hips around   PRECAUTIONS: None  RED FLAGS: None   WEIGHT BEARING RESTRICTIONS: No  FALLS:  Has patient fallen in last 6 months? 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  PLOF: Independent  PATIENT GOALS: To not feel this pain. I want to be able to walk up around [place] like he used to.  NEXT MD VISIT: August 26th  OBJECTIVE:  Note: Objective measures were completed at Evaluation unless otherwise noted.  DIAGNOSTIC FINDINGS:    2023: FINDINGS: There is no evidence of hip fracture or dislocation. There is no evidence of arthropathy or other focal bone abnormality.   IMPRESSION: Negative.  PATIENT SURVEYS:  LEFS : Lower Extremity Functional Score: 48 / 80 = 60.0 %  ABC (05/08/24): 1200/1600 - 75%  06/26/24  LEFS: 44/80 1350 / 1600 = 84.4 %   07/11/24: LEFS: 48 / 80 = 60.0 % COGNITION: Overall cognitive status: Within functional limits for tasks assessed     SENSATION: WFL  MUSCLE  LENGTH: Hamstrings: Right ~110 bilat deg; Left ~110 bilat deg  -  moderate tightness during passive 90/90 test  POSTURE: rounded shoulders and forward head  PALPATION:    LOWER EXTREMITY MMT:  MMT Right eval Left eval Right 06/26/24 Left 06/26/24 Right 07/11/24 Left  07/11/24  Hip flexion 4- 4- 4+ 4-, pain in left hip 4+ 4+  Hip extension 3+ 3+ 3+ 3+ 4 4  Hip abduction 4 4 4  4+ 4+ 4+  Hip adduction        Hip internal rotation        Hip external rotation        Knee flexion        Knee extension        Ankle dorsiflexion 5 5 5 5 5 5   Ankle plantarflexion        Ankle inversion        Ankle eversion         (Blank rows = not tested)  LOWER EXTREMITY SPECIAL TESTS:  Hip special tests: Belvie (FABER) test: positive   FUNCTIONAL TESTS:  30 seconds  chair stand test: 10.5 STS, no UE  4 Stage Balance: Side by side: 30, very mild swaying Semi tandem: 30 each foot leading, mild swaying Full tandem: unable to with RLE leading, L foot: 3 Single Leg stance: R foot - Unable, L foot: 3  DGI (performed 05/08/24) 1. Gait level surface (2) Mild Impairment: Walks 20', uses assistive devices, slower speed, mild gait deviations. 2. Change in gait speed (2) Mild Impairment: Is able to change speed but demonstrates mild gait deviations, or not gait deviations but unable to achieve a significant change in velocity, or uses an assistive device. 3. Gait with horizontal head turns (1) Moderate Impairment: Performs head turns with moderate change in gait velocity, slows down, staggers but recovers, can continue to walk. 4. Gait with vertical head turns 1) Moderate Impairment: Performs head turns with moderate change in gait velocity, slows down, staggers but recovers, can continue to walk. 5. Gait and pivot turn (2) Mild Impairment: Pivot turns safely in > 3 seconds and stops with no loss of balance. 6. Step over obstacle (2) Mild Impairment: Is able to step over box, but must slow  down and adjust steps to clear box safely. 7. Step around obstacles (2) Mild Impairment: Is able to step around both cones, but must slow down and adjust steps to clear cones. 8. Stairs (2) Mild Impairment: Alternating feet, must use rail.  TOTAL SCORE: 14 / 24   06/26/24: 30 Second Chair stand test: 9.5 Left foot in front 28.37 s Right foot in front 15.05 s SLS 06/26/24: R: less than half a second L: 4s  GAIT: Distance walked: 100 Assistive device utilized: None Level of assistance: Complete Independence Comments: Mild limp at times (slight leg length discrepancy), L leg demo inc flexion throughout cycle,                                                                                                                                 TREATMENT DATE:  07/11/2024  Progress note performed: LEFS, education on HEP, goal tracking, PT recommendations.  Therapeutic Exercise: -Nustep, 6 minutes while completing updated LEFS, level 6 resistance, pt cued for 70 SPM -Sit to stands, 2 sets of 10 reps, pt cued for core activation and controlled descent   Neuromuscular Re-education: -Lateral stepping 5 laps parallel bars, with RTB around knees, pt cued for upright posture and no UE support -Monster walks, 5 laps in parallel bars, with RTB around knees, pt cued for upright posture and no UE support -Forward lunges, 1 set of 7 reps better performance going into RLE, pt cued for core activation and upright posture -Standing balance, 30 second bouts, NBOS, tandem stance (bilateral), SLS, blue foam normal BOS with EC   06/28/24: Nustep UE/LE 5' [place] L5 resistance, seat 15 and UE Standing inside // bars:  Toe tapping 6 in X10 alternating with BUE then  X10 no UE support  Tandem stance 2x 30  Slant board stretch 2X30 Seated hamstring stretch 2x 30 BLE STS no HHA 10x standard chair height with cueing for eccentric control Forward 2X6 and 2X12 hurdles on 19ft line, 2RT step to and over  SBA Lateral step over 2X6 and 2X12 hurdles on 30ft line, 2RT step to and over SBA  Standing lumbar extension, hands on hips 5X  06/28/24: STS no HHA 10x standard chair height with cueing for eccentric control Nustep UE/LE 5' [place] L3 resistance Standing inside // bars:  Forward 6in hurdles then 12in  Lateral step over 12 in hurdles 3RT   Toe tapping 6 in 2x 10 alternating with BUE then no UE support  Sidestep with GTB 2RT down black line  Tandem stance 2x 30 Seated hamstring stretch 2x 30 BLE   PATIENT EDUCATION:  Education details: PT evaluation, objective findings, POC, Importance of HEP, Precautions, Clinic policies Person educated: Patient Education method: Explanation and Demonstration Education comprehension: verbalized understanding and returned demonstration  HOME EXERCISE PROGRAM: Access Code: [email or web address] URL: [email or web address] Date: 05/04/2024 Prepared by: Rosaria Powell-Butler  Exercises - Seated Hamstring Stretch  - 2 x daily - 7 x weekly - 3 sets - 30 hold - Sit to Stand  - 2 x daily - 7 x weekly - 3 sets - 10 reps - Supine Bridge  - 2 x daily - 7 x weekly - 3 sets - 10 reps - Standing Tandem Balance with Counter Support  - 2 x daily - 7 x weekly - 3 sets - 30 hold  06/28/24: - Side Stepping with Resistance at Thighs and Counter Support  - 1 x daily - 7 x weekly - 3 sets - 10 reps  ASSESSMENT:  CLINICAL IMPRESSION: Patient continues to demonstrate improved LE strength, fair endurance, decreased gait quality and balance. Patient also demonstrates ability to meet 4/7 therapy goals during today's session. Pt still demonstrating most difficulty with balance and endurance goals. Patient able to progress dynamic balance and core activation exercises today with step up variations and lunges on bosu ball, good performance with verbal cueing. Pt was educated on the importance of becoming more independent with HEP as this is the goal of therapy.  Patient would continue to benefit from additional skilled physical therapy for 4 weeks at 2 visits per week in order to demonstrate increased endurance with ambulation, increased LE strength, and improved balance for improved quality of life, improved independence with decreased fall risk and continued progress towards therapy goals.    (Initial) Patient is a 78 y.o. 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 On this date, patient demonstrates dec LE strength, impaired balance, altered gait, dec endurance, and inc pain. Hip pain is reproduced on R hip only during FABER testing. Patient also demo moderate tightness in hamstrings bilaterally. Patient will benefit from continued skilled physical therapy in order to address the above to improve his function and quality of life.    OBJECTIVE IMPAIRMENTS: Abnormal gait, decreased activity tolerance, decreased balance, decreased coordination, decreased endurance, decreased ROM, decreased strength, improper body mechanics, postural dysfunction, and pain.   ACTIVITY LIMITATIONS: lifting, bending, sitting, standing, squatting, stairs, and transfers  PARTICIPATION LIMITATIONS: meal prep, cleaning, laundry, community activity, and yard work  PERSONAL FACTORS: N/A are also affecting patient's functional outcome.   REHAB POTENTIAL: Good  CLINICAL DECISION MAKING: Stable/uncomplicated  EVALUATION COMPLEXITY: Low   GOALS: Goals reviewed with patient? No  SHORT  TERM GOALS: Target date: 05/18/24 Patient will be independent with performance of HEP to demonstrate adequate self management of symptoms.  Baseline:  Goal status: Progressing  2.   Patient will report at least a 25% improvement with function or pain overall since beginning PT. Baseline: 70% reported 07/11/24 Goal status: MET   LONG TERM GOALS: Target date:  06/15/24  Patient will improve LEFS by at least 9 points to demo improved self perceived function while meeting MCID. Baseline:  Goal status: PROGRESSING  2.  Patient will improve hip ext MMT to at least 4/5 to demonstrate to improved LE strength needed for functional transfer. Baseline:  Goal status: MET  3.  Patient will improve 30 second chair stand by at least 3 STS in order to demonstrate improved LE endurance needed for community ambulation. Baseline:  Goal status: PROGRESSING  4.  Patient will maintain tandem balance for at least 10 seconds with each LE leading to demonstrate improved overall balance to decrease fall risk.  Baseline:  Goal status: MET  5. Patient will reduce falls risk as indicated by Activities Specific Balance Confidence Scale (ABC) >67%.  Baseline:   Goal status: MET   PLAN:  PT FREQUENCY: 2x/week  PT DURATION: 4 weeks  PLANNED INTERVENTIONS: 97164- PT Re-evaluation, 97110-Therapeutic exercises, 97530- Therapeutic activity, [id number]- Neuromuscular re-education, 97535- Self Care, 02859- Manual therapy, [id number]- Gait training, [phone]- Electrical stimulation (manual), [id number]- Traction (mechanical), 20560 (1-2 muscles), 20561 (3+ muscles)- Dry Needling, Patient/Family education, Balance training, Stair training, Taping, Joint mobilization, Spinal mobilization, Cryotherapy, and Moist heat  PLAN FOR NEXT SESSION: progress LE strengthening, core strengthening, progress hip mobility and balance. Seeking additional 4 weeks at 2 visits per week for progress towards remaining unmet goals.  Lang Ada, PT, DPT [facility] Office: [phone] 11:54 AM, 07/11/24   [facility] Medicare Auth Request Information Treatment Start Date: 05/08/24   Date of referral: 03/30/24 Referring provider:   Silva Juliene SAUNDERS, DPM    Referring diagnosis (ICD 10)?  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 (if different than referring diagnosis) M25.551, M25.552; Z74.09; R29.898     What was this (referring dx) caused by? Ongoing Issue   Lysle of Condition: Recurrent (multiple episodes of < 3 months)             Laterality: Both   Current Functional Measure Score: LEFS: 48/80   Objective measurements identify impairments when they are compared to normal values, the uninvolved extremity, and prior level of function.            [x]  Yes            []  No   Objective assessment of functional ability: Moderate functional limitations             Briefly describe symptoms: hip pain/weakness, impaired balance   How did symptoms start: age related   Average pain intensity:            Last 24 hours: none reported            Past week: none reported   How often does the pt experience symptoms? Frequently   How much have the symptoms interfered with usual daily activities? Moderately   How has condition changed since care began at this facility? A little better   In general, how is the patients overall health? Fair     BACK PAIN (STarT Back Screening Tool)  No

## 2024-07-13 ENCOUNTER — Encounter (HOSPITAL_COMMUNITY)

## 2024-07-13 ENCOUNTER — Encounter (HOSPITAL_COMMUNITY): Payer: Self-pay

## 2024-07-13 ENCOUNTER — Telehealth (HOSPITAL_COMMUNITY): Payer: Self-pay

## 2024-07-13 NOTE — Telephone Encounter (Signed)
 Left message on patients voicemail regarding missed appointment today; left phone number for call back.  9:32 AM, 07/13/24 Jorge Jefferson MPT [place] [facility] [place] [phone]

## 2024-07-13 NOTE — Therapy (Signed)
 PHYSICAL THERAPY DISCHARGE SUMMARY  Visits from Start of Care: 7  Current functional level related to goals / functional outcomes: Met 4/7 goals   Remaining deficits:  Pt still demonstrating most difficulty with balance and endurance goals.    Education / Equipment: HEP   Patient agrees to discharge. Patient goals were partially met. Patient is being discharged due to being pleased with the current functional level.; patient request  10:23 AM, 07/13/24 Salah Nakamura Small Gavin Telford MPT [facility] [place] [phone]

## 2024-07-17 ENCOUNTER — Other Ambulatory Visit (HOSPITAL_COMMUNITY): Payer: Self-pay | Admitting: Nurse Practitioner

## 2024-07-17 DIAGNOSIS — K7402 Hepatic fibrosis, advanced fibrosis: Secondary | ICD-10-CM | POA: Diagnosis not present

## 2024-07-17 DIAGNOSIS — I10 Essential (primary) hypertension: Secondary | ICD-10-CM | POA: Diagnosis not present

## 2024-07-17 DIAGNOSIS — K760 Fatty (change of) liver, not elsewhere classified: Secondary | ICD-10-CM | POA: Diagnosis not present

## 2024-07-17 DIAGNOSIS — F1090 Alcohol use, unspecified, uncomplicated: Secondary | ICD-10-CM

## 2024-07-18 ENCOUNTER — Encounter (HOSPITAL_COMMUNITY)

## 2024-07-20 ENCOUNTER — Encounter (HOSPITAL_COMMUNITY): Admitting: Physical Therapy

## 2024-07-25 ENCOUNTER — Encounter (HOSPITAL_COMMUNITY)

## 2024-07-25 ENCOUNTER — Ambulatory Visit (HOSPITAL_COMMUNITY)
Admission: RE | Admit: 2024-07-25 | Discharge: 2024-07-25 | Disposition: A | Discharge status: Home / Self Care | Source: Ambulatory Visit | Attending: Nurse Practitioner | Admitting: Nurse Practitioner

## 2024-07-25 DIAGNOSIS — K7402 Hepatic fibrosis, advanced fibrosis: Secondary | ICD-10-CM | POA: Diagnosis not present

## 2024-07-25 DIAGNOSIS — F1090 Alcohol use, unspecified, uncomplicated: Secondary | ICD-10-CM | POA: Diagnosis present

## 2024-07-25 DIAGNOSIS — K760 Fatty (change of) liver, not elsewhere classified: Secondary | ICD-10-CM | POA: Insufficient documentation

## 2024-07-25 DIAGNOSIS — R932 Abnormal findings on diagnostic imaging of liver and biliary tract: Secondary | ICD-10-CM | POA: Diagnosis not present

## 2024-07-27 ENCOUNTER — Encounter (HOSPITAL_COMMUNITY)

## 2024-08-01 ENCOUNTER — Encounter (HOSPITAL_COMMUNITY)

## 2024-08-03 ENCOUNTER — Encounter (HOSPITAL_COMMUNITY)

## 2024-08-17 ENCOUNTER — Ambulatory Visit (HOSPITAL_COMMUNITY)
Admission: RE | Admit: 2024-08-17 | Discharge: 2024-08-17 | Disposition: A | Discharge status: Home / Self Care | Source: Ambulatory Visit | Attending: Family Medicine | Admitting: Family Medicine

## 2024-08-17 DIAGNOSIS — F17210 Nicotine dependence, cigarettes, uncomplicated: Secondary | ICD-10-CM | POA: Diagnosis present

## 2024-08-24 ENCOUNTER — Telehealth: Payer: Self-pay

## 2024-08-24 NOTE — Progress Notes (Signed)
   08/24/2024  Patient ID: Jorge Jefferson, male   DOB: October 25, 1945, 78 y.o.   MRN: [id number]   2025 Medication Assistance Application Summary:  Patient was outreached regarding medication assistance for 2025. Verified address, anticipated insurance for 2025, and income. Patient is interested in PAP for 2025 for Farxiga 5 mg, no other new medications were identified for medication assistance.    Medication Assistance Findings:  Medication assistance needs identified: Farxiga     Additional medication assistance options reviewed with patient as warranted:  No other options identified  Plan: I will route patient assistance letter to pharmacy technician who will coordinate patient assistance program application process for medications listed above.  Pharmacy technician will assist with obtaining all required documents from both patient and provider(s) and submit application(s) once completed.    Thank you for allowing pharmacy to be a part of this patient's care.  Heather Factor, PharmD Clinical Pharmacist  [phone]

## 2024-08-25 ENCOUNTER — Telehealth: Payer: Self-pay

## 2024-08-25 NOTE — Telephone Encounter (Signed)
 PAP: Patient assistance application for Farxiga through [employer] ([employer]) has been mailed to pt's home address on file. Provider portion of application will be faxed to provider's office.  Provider portion has been faxed to Dr. Elna Potters at [facility]

## 2024-08-31 NOTE — Progress Notes (Addendum)
   08/31/2024  Patient ID: Jorge Jefferson, male   DOB: [birth year], 78 y.o.   MRN: [id number]   Called patient to follow up on PAP. I was unable to reach the patient today. I will attempt to reach him again next week. Per [employer], the application was mailed out last week.   Heather Factor, PharmD Clinical Pharmacist  [phone]

## 2024-09-05 ENCOUNTER — Encounter (HOSPITAL_COMMUNITY): Payer: Self-pay | Admitting: *Deleted

## 2024-09-05 ENCOUNTER — Other Ambulatory Visit: Payer: Self-pay

## 2024-09-05 ENCOUNTER — Emergency Department (HOSPITAL_COMMUNITY)
Admission: EM | Admit: 2024-09-05 | Discharge: 2024-09-05 | Disposition: A | Discharge status: Home / Self Care | Attending: Emergency Medicine | Admitting: Emergency Medicine

## 2024-09-05 DIAGNOSIS — I10 Essential (primary) hypertension: Secondary | ICD-10-CM | POA: Insufficient documentation

## 2024-09-05 DIAGNOSIS — Z79899 Other long term (current) drug therapy: Secondary | ICD-10-CM | POA: Diagnosis not present

## 2024-09-05 DIAGNOSIS — Z7902 Long term (current) use of antithrombotics/antiplatelets: Secondary | ICD-10-CM | POA: Diagnosis not present

## 2024-09-05 NOTE — Telephone Encounter (Signed)
 Received provider portion of patient assistance application

## 2024-09-05 NOTE — Discharge Instructions (Signed)
 As we discussed, check your blood pressure daily at the same time every day and keep a log of the measurements. Take your medications as prescribed by your provider and keep your recheck appointment next week. Take the log of blood pressures with you to that appointment to review with your provider.  Return to the ED with any new or concerning symptoms at any time.

## 2024-09-05 NOTE — ED Triage Notes (Addendum)
 Pt being seen for high blood pressure with his machine at home. 184/94 per pt. Denies any pain.

## 2024-09-05 NOTE — ED Provider Notes (Signed)
 [place] EMERGENCY DEPARTMENT AT [facility] Provider Note   CSN: [id number] Arrival date & time: 09/05/24  1348     Patient presents with: Hypertension   Jorge Jefferson is a 78 y.o. male.   Patient to ED with concern for high blood pressure readings he is getting on his machine at home, referencing 184/94 as example. No chest pain, SOB/DOE, peripheral edema. He was seen by primary care yesterday who started new medications that he has yet to pick up at the pharmacy. He came here for a blood pressure check.   The history is provided by the patient. No language interpreter was used.  Hypertension       Prior to Admission medications   Medication Sig Start Date End Date Taking? Authorizing Provider  amLODipine (NORVASC) 10 MG tablet Take 10 mg by mouth daily. 04/03/15   [provider]  atorvastatin (LIPITOR) 10 MG tablet Take 10 mg by mouth daily. 08/08/21   [provider]  Blood Glucose Monitoring Suppl (ONE TOUCH ULTRA 2) w/Device KIT  11/28/19   [provider]  cholecalciferol (VITAMIN D) 25 MCG (1000 UNIT) tablet Take 1,000 Units by mouth daily.    [provider]  ciprofloxacin  (CILOXAN ) 0.3 % ophthalmic solution 3 drops 2 (two) times daily. Place in both Ears 07/21/19   [provider]  cloNIDine (CATAPRES) 0.1 MG tablet Take 0.1 mg by mouth daily. 09/03/21   [provider]  clopidogrel  (PLAVIX ) 75 MG tablet Take 1 tablet (75 mg total) by mouth daily. 05/10/24 04/05/25  Gretta Lonni JINNY, MD  dexamethasone  (DECADRON ) 0.1 % ophthalmic solution 2 drops 2 (two) times daily. Place in both Ears 01/25/23   [provider]  Lancets [facility] CATHRYNE PLUS [place]) MISC  11/28/19   [provider]  Magnesium 250 MG TABS Take 250 mg by mouth daily.    [provider]  Multiple Vitamins-Minerals (CENTRUM ADULTS) TABS Take 1 tablet by mouth daily.    [provider]  nicotine (NICODERM  CQ - DOSED IN MG/24 HOURS) 21 mg/24hr patch Place 21 mg onto the skin every other day. 07/29/20   [provider]  Omega-3 Fatty Acids (OMEGA III EPA+DHA) 1000 MG CAPS Krill oil    [provider]  [facility] ULTRA test strip  11/28/19   [provider]  oxyCODONE  (ROXICODONE ) 5 MG immediate release tablet Take 1 tablet (5 mg total) by mouth every 4 (four) hours as needed for severe pain (pain score 7-10). 05/24/24   Raford Lenis, MD  predniSONE  (DELTASONE ) 50 MG tablet Take 1 tablet (50 mg total) by mouth daily. 05/24/24   Raford Lenis, MD    Allergies: Patient has no known allergies.    Review of Systems  Updated Vital Signs BP (!) 161/75   Pulse 60   Temp 97.7 F (36.5 C) (Oral)   Resp 11   SpO2 96%   Physical Exam Vitals and nursing note reviewed.  Constitutional:      General: He is not in acute distress.    Appearance: Normal appearance. He is well-developed. He is not ill-appearing.  HENT:     Head: Normocephalic.  Cardiovascular:     Rate and Rhythm: Normal rate and regular rhythm.     Heart sounds: No murmur heard. Pulmonary:     Effort: Pulmonary effort is normal.     Breath sounds: Normal breath sounds. No wheezing, rhonchi or rales.  Abdominal:     General: Bowel sounds  are normal.     Palpations: Abdomen is soft.     Tenderness: There is no abdominal tenderness. There is no guarding or rebound.  Musculoskeletal:        General: Normal range of motion.     Cervical back: Normal range of motion and neck supple.     Right lower leg: No edema.     Left lower leg: No edema.  Skin:    General: Skin is warm and dry.  Neurological:     General: No focal deficit present.     Mental Status: He is alert and oriented to person, place, and time.     (all labs ordered are listed, but only abnormal results are displayed) Labs Reviewed - No data to display  EKG: None  Radiology: No results found.   Procedures   Medications Ordered in the ED  - No data to display  Clinical Course as of 09/05/24 1629  Tue Sep 05, 2024  1626 Patient with history of high blood pressure here to have his blood pressure checked. He has noticed it was elevated at home, and at his PCP check yesterday. Provided new prescriptions but has not picked them up yet. He is asymptomatic. BP here: systolic 161 - 187. Diastolic 73-78.   Have discussed keeping a log of his pressures and keeping his already scheduled appointment with PCP for recheck next week to review. He is encouraged to take the medications as prescribed.  [SU]    Clinical Course User Index [SU] Odell Balls, PA-C                                 Medical Decision Making       Final diagnoses:  Hypertension, unspecified type    ED Discharge Orders     None          Odell Balls, PA-C 09/05/24 1629    Suzette Pac, MD 09/07/24 1413

## 2024-09-26 ENCOUNTER — Ambulatory Visit: Admitting: Podiatry

## 2024-09-27 ENCOUNTER — Ambulatory Visit: Admitting: Podiatry

## 2024-09-27 ENCOUNTER — Encounter: Payer: Self-pay | Admitting: Podiatry

## 2024-09-27 DIAGNOSIS — M79676 Pain in unspecified toe(s): Secondary | ICD-10-CM | POA: Diagnosis not present

## 2024-09-27 DIAGNOSIS — B351 Tinea unguium: Secondary | ICD-10-CM | POA: Diagnosis not present

## 2024-09-27 DIAGNOSIS — E1151 Type 2 diabetes mellitus with diabetic peripheral angiopathy without gangrene: Secondary | ICD-10-CM | POA: Diagnosis not present

## 2024-10-01 NOTE — Progress Notes (Signed)
 1. Failure to attend appointment with reason given    Appointment rescheduled due to 2 hour delay secondary to inclement weather.

## 2024-10-03 NOTE — Telephone Encounter (Signed)
 LVM for patient to return my call regarding patient assistance applications mailed 11/7

## 2024-10-05 ENCOUNTER — Encounter: Payer: Self-pay | Admitting: Podiatry

## 2024-10-05 NOTE — Progress Notes (Signed)
°  Subjective:  Patient ID: Jorge Jefferson, male    DOB: 1945-12-22,  MRN: [id number]  Jorge Jefferson presents to clinic today for at risk foot care. Pt has h/o NIDDM with PAD and painful elongated mycotic toenails 1-5 bilaterally which are tender when wearing enclosed shoe gear. Pain is relieved with periodic professional debridement.  Chief Complaint  Patient presents with   RFC    Rm15 [facility]/ DR. Arno Potters last visit April 24 2024   New problem(s): None.   PCP is [facility], MD.  Allergies[1]  Review of Systems: Negative except as noted in the HPI.  Objective: No changes noted in today's physical examination. There were no vitals filed for this visit. Jorge Jefferson is a pleasant 78 y.o. male in NAD. AAO x 3.  Vascular Examination: CFT <4 seconds b/l. DP pulses diminished b/l. PT pulses diminished b/l. Digital hair absent. Skin temperature gradient warm to cool b/l. No ischemia or gangrene. No cyanosis or clubbing noted b/l. No edema noted b/l LE.   Neurological Examination: Sensation grossly intact b/l with 10 gram monofilament. Vibratory sensation intact b/l.   Dermatological Examination: Pedal skin thin, shiny and atrophic b/l. No open wounds. No interdigital macerations.   Toenails 1-5 b/l thick, discolored, elongated with subungual debris and pain on dorsal palpation.   No corns, calluses nor porokeratotic lesions noted.  Musculoskeletal Examination: Muscle strength 5/5 to all lower extremity muscle groups bilaterally. Hammertoe(s) 1-5 bilaterally.  Radiographs: None.  Assessment/Plan: 1. Pain due to onychomycosis of toenail   2. Type II diabetes mellitus with peripheral circulatory disorder [facility])   Consent given for treatment. Patient examined. All patient's and/or POA's questions/concerns addressed on today's visit. Toenails 1-5 b/l debrided in length and girth without incident. Treatment was provided by assistant Mable FABIENE Cherry under my  supervision.Continue foot and shoe inspections daily. Monitor blood glucose per PCP/Endocrinologist's recommendations. Continue soft, supportive shoe gear daily. Report any pedal injuries to medical professional. Call office if there are any questions/concerns.  Return in about 3 months (around 12/26/2024).  Jorge Jefferson, DPM      [place] LOCATION: [address], [place] [address]                   Office [phone]   [facility] LOCATION: [address] [place], [place] [address] Office [phone]     [1] No Known Allergies

## 2024-10-26 NOTE — Telephone Encounter (Signed)
 2nd attempt to contact patient regarding patient assistance, left HIPAA compliant voicemail for patient to return my call

## 2024-11-02 ENCOUNTER — Telehealth: Payer: Self-pay

## 2024-11-02 NOTE — Telephone Encounter (Signed)
 Unable to reach patient after multiple attempts-did not return his application.  Will reopen if patient contacts me or returns application

## 2024-11-02 NOTE — Progress Notes (Signed)
" ° °  11/02/2024  Patient ID: Jorge Jefferson, male   DOB: 03/07/1946, 79 y.o.   MRN: [id number]  Contacted patient today to follow up on medication assistance referral for Farxiga. The patient was reluctant to discuss Farxiga with me, as he does not wish to be on another medication. I educated the patient about the benefits of this medication for his CKD. The patient became more open to the conversation and would like to discuss Farxiga initiation with his PCP at his upcoming visit on 11/28/24.   I will follow up after upcoming PCP visit.   Heather Factor, PharmD Clinical Pharmacist  [phone]   "

## 2024-11-22 ENCOUNTER — Observation Stay (HOSPITAL_COMMUNITY)
Admission: EM | Admit: 2024-11-22 | Source: Home / Self Care | Attending: Emergency Medicine | Admitting: Internal Medicine

## 2024-11-22 ENCOUNTER — Encounter (HOSPITAL_COMMUNITY): Payer: Self-pay

## 2024-11-22 ENCOUNTER — Emergency Department (HOSPITAL_COMMUNITY)

## 2024-11-22 ENCOUNTER — Observation Stay (HOSPITAL_COMMUNITY)

## 2024-11-22 ENCOUNTER — Other Ambulatory Visit: Payer: Self-pay

## 2024-11-22 DIAGNOSIS — I6381 Other cerebral infarction due to occlusion or stenosis of small artery: Secondary | ICD-10-CM | POA: Diagnosis not present

## 2024-11-22 DIAGNOSIS — K760 Fatty (change of) liver, not elsewhere classified: Secondary | ICD-10-CM | POA: Diagnosis not present

## 2024-11-22 DIAGNOSIS — R29705 NIHSS score 5: Secondary | ICD-10-CM | POA: Diagnosis not present

## 2024-11-22 DIAGNOSIS — E782 Mixed hyperlipidemia: Secondary | ICD-10-CM | POA: Diagnosis not present

## 2024-11-22 DIAGNOSIS — I639 Cerebral infarction, unspecified: Principal | ICD-10-CM | POA: Diagnosis present

## 2024-11-22 DIAGNOSIS — N1831 Chronic kidney disease, stage 3a: Secondary | ICD-10-CM | POA: Insufficient documentation

## 2024-11-22 DIAGNOSIS — I10 Essential (primary) hypertension: Secondary | ICD-10-CM | POA: Diagnosis present

## 2024-11-22 LAB — URINALYSIS, ROUTINE W REFLEX MICROSCOPIC
Bacteria, UA: NONE SEEN
Bilirubin Urine: NEGATIVE
Glucose, UA: NEGATIVE mg/dL
Hgb urine dipstick: NEGATIVE
Ketones, ur: NEGATIVE mg/dL
Leukocytes,Ua: NEGATIVE
Nitrite: NEGATIVE
Protein, ur: >=300 mg/dL — AB
Specific Gravity, Urine: 1.027 (ref 1.005–1.030)
pH: 6 (ref 5.0–8.0)

## 2024-11-22 LAB — COMPREHENSIVE METABOLIC PANEL WITH GFR
ALT: 16 U/L (ref 0–44)
AST: 23 U/L (ref 15–41)
Albumin: 3.6 g/dL (ref 3.5–5.0)
Alkaline Phosphatase: 110 U/L (ref 38–126)
Anion gap: 11 (ref 5–15)
BUN: 17 mg/dL (ref 8–23)
CO2: 22 mmol/L (ref 22–32)
Calcium: 9.5 mg/dL (ref 8.9–10.3)
Chloride: 109 mmol/L (ref 98–111)
Creatinine, Ser: 1.48 mg/dL — ABNORMAL HIGH (ref 0.61–1.24)
GFR, Estimated: 48 mL/min — ABNORMAL LOW
Glucose, Bld: 131 mg/dL — ABNORMAL HIGH (ref 70–99)
Potassium: 4.3 mmol/L (ref 3.5–5.1)
Sodium: 141 mmol/L (ref 135–145)
Total Bilirubin: 0.4 mg/dL (ref 0.0–1.2)
Total Protein: 6.2 g/dL — ABNORMAL LOW (ref 6.5–8.1)

## 2024-11-22 LAB — DIFFERENTIAL
Abs Immature Granulocytes: 0.02 10*3/uL (ref 0.00–0.07)
Basophils Absolute: 0 10*3/uL (ref 0.0–0.1)
Basophils Relative: 1 %
Eosinophils Absolute: 0.1 10*3/uL (ref 0.0–0.5)
Eosinophils Relative: 2 %
Immature Granulocytes: 0 %
Lymphocytes Relative: 19 %
Lymphs Abs: 1.1 10*3/uL (ref 0.7–4.0)
Monocytes Absolute: 0.5 10*3/uL (ref 0.1–1.0)
Monocytes Relative: 8 %
Neutro Abs: 4.3 10*3/uL (ref 1.7–7.7)
Neutrophils Relative %: 70 %

## 2024-11-22 LAB — CBC
HCT: 39.6 % (ref 39.0–52.0)
Hemoglobin: 13.1 g/dL (ref 13.0–17.0)
MCH: 31 pg (ref 26.0–34.0)
MCHC: 33.1 g/dL (ref 30.0–36.0)
MCV: 93.6 fL (ref 80.0–100.0)
Platelets: 167 10*3/uL (ref 150–400)
RBC: 4.23 MIL/uL (ref 4.22–5.81)
RDW: 13.5 % (ref 11.5–15.5)
WBC: 6.1 10*3/uL (ref 4.0–10.5)
nRBC: 0 % (ref 0.0–0.2)

## 2024-11-22 LAB — URINE DRUG SCREEN
Amphetamines: NEGATIVE
Barbiturates: NEGATIVE
Benzodiazepines: NEGATIVE
Cocaine: NEGATIVE
Fentanyl: NEGATIVE
Methadone Scn, Ur: NEGATIVE
Opiates: NEGATIVE
Tetrahydrocannabinol: POSITIVE — AB

## 2024-11-22 LAB — HEMOGLOBIN A1C
Hgb A1c MFr Bld: 5.2 % (ref 4.8–5.6)
Mean Plasma Glucose: 102.54 mg/dL

## 2024-11-22 LAB — CBG MONITORING, ED: Glucose-Capillary: 138 mg/dL — ABNORMAL HIGH (ref 70–99)

## 2024-11-22 LAB — ETHANOL: Alcohol, Ethyl (B): <15 mg/dL

## 2024-11-22 LAB — APTT: aPTT: <22 s — ABNORMAL LOW (ref 24–36)

## 2024-11-22 LAB — AMMONIA: Ammonia: 32 umol/L (ref 9–35)

## 2024-11-22 LAB — PROTIME-INR
INR: 1 (ref 0.8–1.2)
Prothrombin Time: 13.3 s (ref 11.4–15.2)

## 2024-11-22 MED ORDER — IPRATROPIUM-ALBUTEROL 0.5-2.5 (3) MG/3ML IN SOLN
3.0000 mL | Freq: Three times a day (TID) | RESPIRATORY_TRACT | Status: DC
  Filled 2024-11-22: qty 3

## 2024-11-22 MED ORDER — CLOPIDOGREL BISULFATE 75 MG PO TABS: 75.0000 mg | ORAL_TABLET | Freq: Every day | ORAL | Status: DC

## 2024-11-22 MED ORDER — IPRATROPIUM-ALBUTEROL 0.5-2.5 (3) MG/3ML IN SOLN
3.0000 mL | Freq: Four times a day (QID) | RESPIRATORY_TRACT | Status: AC | PRN
  Filled 2024-11-22: qty 3

## 2024-11-22 MED ORDER — ACETAMINOPHEN 650 MG RE SUPP: 650.0000 mg | RECTAL | Status: AC | PRN

## 2024-11-22 MED ORDER — SENNOSIDES-DOCUSATE SODIUM 8.6-50 MG PO TABS: 1.0000 | ORAL_TABLET | Freq: Every evening | ORAL | Status: AC | PRN

## 2024-11-22 MED ORDER — CLOPIDOGREL BISULFATE 75 MG PO TABS
75.0000 mg | ORAL_TABLET | Freq: Every day | ORAL | Status: AC
  Administered 2024-11-22 – 2024-11-24 (×3): 75 mg via ORAL
  Filled 2024-11-22 (×4): qty 1

## 2024-11-22 MED ORDER — IOHEXOL 350 MG/ML SOLN
100.0000 mL | Freq: Once | INTRAVENOUS | Status: AC | PRN
  Administered 2024-11-22: 100 mL via INTRAVENOUS

## 2024-11-22 MED ORDER — ATORVASTATIN CALCIUM 10 MG PO TABS
10.0000 mg | ORAL_TABLET | Freq: Every day | ORAL | Status: AC
  Administered 2024-11-22 – 2024-11-24 (×3): 10 mg via ORAL
  Filled 2024-11-22 (×3): qty 1

## 2024-11-22 MED ORDER — ENOXAPARIN SODIUM 60 MG/0.6ML IJ SOSY
60.0000 mg | PREFILLED_SYRINGE | INTRAMUSCULAR | Status: AC
  Administered 2024-11-22 – 2024-11-24 (×3): 60 mg via SUBCUTANEOUS
  Filled 2024-11-22 (×3): qty 0.6

## 2024-11-22 MED ORDER — VITAMIN D 25 MCG (1000 UNIT) PO TABS
1000.0000 [IU] | ORAL_TABLET | Freq: Every day | ORAL | Status: AC
  Administered 2024-11-23 – 2024-11-24 (×2): 1000 [IU] via ORAL
  Filled 2024-11-22 (×2): qty 1

## 2024-11-22 MED ORDER — ACETAMINOPHEN 160 MG/5ML PO SOLN: 650.0000 mg | ORAL | Status: AC | PRN

## 2024-11-22 MED ORDER — STROKE: EARLY STAGES OF RECOVERY BOOK
Freq: Once | Status: AC
  Filled 2024-11-22: qty 1

## 2024-11-22 MED ORDER — ASPIRIN 325 MG PO TBEC
325.0000 mg | DELAYED_RELEASE_TABLET | Freq: Once | ORAL | Status: AC
  Administered 2024-11-22: 325 mg via ORAL
  Filled 2024-11-22: qty 1

## 2024-11-22 MED ORDER — NICOTINE 21 MG/24HR TD PT24
21.0000 mg | MEDICATED_PATCH | TRANSDERMAL | Status: AC
  Administered 2024-11-22 – 2024-11-24 (×2): 21 mg via TRANSDERMAL
  Filled 2024-11-22 (×2): qty 1

## 2024-11-22 MED ORDER — ACETAMINOPHEN 325 MG PO TABS: 650.0000 mg | ORAL_TABLET | ORAL | Status: AC | PRN

## 2024-11-22 MED ORDER — SODIUM CHLORIDE 0.9 % IV SOLN: INTRAVENOUS | Status: AC

## 2024-11-22 NOTE — ED Provider Notes (Signed)
 " [place] EMERGENCY DEPARTMENT AT [facility] Provider Note   CSN: [id number] Arrival date & time: 11/22/24  9061  An emergency department physician performed an initial assessment on this suspected stroke patient at 0940.  Patient presents with: Code Stroke (LKW 0030 )   Jorge Jefferson is a 79 y.o. male.  {Add pertinent medical, surgical, social history, OB history to YEP:67052} Patient has a history of diabetes and hypertension.  He went to bed around 1230 last night and he was fine and then when he woke up this morning he had slurred speech right facial droop and weakness in the right arm and minimal weakness in the right leg   Weakness      Prior to Admission medications  Medication Sig Start Date End Date Taking? Authorizing Provider  amLODipine  (NORVASC ) 10 MG tablet Take 10 mg by mouth daily. 04/03/15   [provider]  atorvastatin  (LIPITOR) 10 MG tablet Take 10 mg by mouth daily. 08/08/21   [provider]  Blood Glucose Monitoring Suppl (ONE TOUCH ULTRA 2) w/Device KIT  11/28/19   [provider]  cholecalciferol  (VITAMIN D ) 25 MCG (1000 UNIT) tablet Take 1,000 Units by mouth daily.    [provider]  ciprofloxacin  (CILOXAN ) 0.3 % ophthalmic solution 3 drops 2 (two) times daily. Place in both Ears 07/21/19   [provider]  cloNIDine (CATAPRES) 0.1 MG tablet Take 0.1 mg by mouth daily. 09/03/21   [provider]  clopidogrel  (PLAVIX ) 75 MG tablet Take 1 tablet (75 mg total) by mouth daily. 05/10/24 04/05/25  Gretta Lonni JINNY, MD  dexamethasone  (DECADRON ) 0.1 % ophthalmic solution 2 drops 2 (two) times daily. Place in both Ears 01/25/23   [provider]  Lancets [facility] CATHRYNE PLUS [place]) MISC  11/28/19   [provider]  Magnesium 250 MG TABS Take 250 mg by mouth daily.    [provider]  Multiple Vitamins-Minerals (CENTRUM ADULTS) TABS Take 1 tablet by mouth daily.     [provider]  nicotine  (NICODERM CQ  - DOSED IN MG/24 HOURS) 21 mg/24hr patch Place 21 mg onto the skin every other day. 07/29/20   [provider]  Omega-3 Fatty Acids (OMEGA III EPA+DHA) 1000 MG CAPS Krill oil    [provider]  [facility] ULTRA test strip  11/28/19   [provider]  oxyCODONE  (ROXICODONE ) 5 MG immediate release tablet Take 1 tablet (5 mg total) by mouth every 4 (four) hours as needed for severe pain (pain score 7-10). 05/24/24   Raford Lenis, MD  predniSONE  (DELTASONE ) 50 MG tablet Take 1 tablet (50 mg total) by mouth daily. 05/24/24   Raford Lenis, MD    Allergies: Patient has no known allergies.    Review of Systems  Neurological:  Positive for weakness.    Updated Vital Signs BP (!) 179/94   Pulse 63   Temp 98.3 F (36.8 C) (Oral)   Resp 17   Ht 6' 9 (2.057 m)   Wt 129 kg   SpO2 99%   BMI 30.48 kg/m   Physical Exam  (all labs ordered are listed, but only abnormal results are displayed) Labs Reviewed  APTT - Abnormal; Notable for the following components:      Result Value   aPTT <22 (*)    All other components within normal limits  COMPREHENSIVE METABOLIC PANEL WITH GFR - Abnormal; Notable for the following components:   Glucose, Bld 131 (*)    Creatinine,  Ser 1.48 (*)    Total Protein 6.2 (*)    GFR, Estimated 48 (*)    All other components within normal limits  URINE DRUG SCREEN - Abnormal; Notable for the following components:   Tetrahydrocannabinol POSITIVE (*)    All other components within normal limits  URINALYSIS, ROUTINE W REFLEX MICROSCOPIC - Abnormal; Notable for the following components:   Protein, ur >=300 (*)    All other components within normal limits  CBG MONITORING, ED - Abnormal; Notable for the following components:   Glucose-Capillary 138 (*)    All other components within normal limits  PROTIME-INR  CBC  DIFFERENTIAL  AMMONIA  ETHANOL  I-STAT CHEM 8, ED     EKG: None  Radiology: DG Chest Port 1 View Result Date: 11/22/2024 CLINICAL DATA:  Shortness of breath EXAM: PORTABLE CHEST 1 VIEW COMPARISON:  April 23, 2009 FINDINGS: The heart size and mediastinal contours are within normal limits. No acute pulmonary abnormality is noted. The visualized skeletal structures are unremarkable. IMPRESSION: No active disease. Electronically Signed   By: Lynwood Landy Raddle M.D.   On: 11/22/2024 10:58   CT ANGIO HEAD NECK W WO CM W PERF (CODE STROKE) Result Date: 11/22/2024 EXAM: CTA Head and Neck with Perfusion 11/22/2024 10:03:56 AM TECHNIQUE: CTA of the head and neck was performed without then with the administration of 100 mL iohexol  (OMNIPAQUE ) 350 MG/ML injection. 3D postprocessing with multiplanar reconstructions and MIPs was performed to evaluate the vascular anatomy. Cerebral perfusion analysis using computed tomography with contrast administration, including post-processing of parametric maps with determination of cerebral blood flow, cerebral blood volume, mean transit time and time-to-maximum. Automated exposure control, iterative reconstruction, and/or weight based adjustment of the mA/kV was utilized to reduce the radiation dose to as low as reasonably achievable. COMPARISON: None available CLINICAL HISTORY: Neuro deficit, acute, stroke suspected. Acute neurologic deficit; stroke suspected. FINDINGS: CTA NECK: AORTIC ARCH AND ARCH VESSELS: No dissection or arterial injury. No significant stenosis of the brachiocephalic or subclavian arteries. CERVICAL CAROTID ARTERIES: There is mild stenosis of the proximal left cervical ICA in the setting of calcified and noncalcified plaque. The right cervical ICA demonstrates no dissection, arterial injury, or hemodynamically significant stenosis by NASCET criteria. CERVICAL VERTEBRAL ARTERIES: The right cervical vertebral artery is hypoplastic along its course and appears to terminate primarily as the right posterior inferior  cerebellar artery. The left cervical vertebral artery demonstrates no dissection, arterial injury, or significant stenosis. LUNGS AND MEDIASTINUM: There is bilateral paraseptal pulmonary emphysema. SOFT TISSUES: No acute abnormality. BONES: No acute abnormality. CTA HEAD: ANTERIOR CIRCULATION: There is mild stenosis of the proximal right petrous ICA. The left intracranial ICA demonstrates no significant stenosis. Hypoplastic versus absent right A1 segment. The left A1 and A2 segments demonstrate no significant stenosis. The middle cerebral arteries demonstrate no significant stenosis. No aneurysm. POSTERIOR CIRCULATION: There is a right posterior communicating artery. The left posterior cerebral artery demonstrates no significant stenosis. The basilar artery demonstrates no significant stenosis. The distal right intracranial vertebral artery is significantly hypoplastic versus stenotic. The left intracranial vertebral artery demonstrates no significant stenosis. No aneurysm. OTHER: No dural venous sinus thrombosis on this non-dedicated study. CT PERFUSION: EXAM QUALITY: Exam quality is adequate with diagnostic perfusion maps. No significant motion artifact. Appropriate arterial inflow and venous outflow curves. CORE INFARCT (CBF<30% volume): 0 mL TOTAL HYPOPERFUSION (Tmax>6s volume): 0 mL PENUMBRA: Mismatch volume: 0 mL Mismatch ratio: not applicable Location: not applicable IMPRESSION: 1. No acute large vessel occlusion, hemodynamically significant  stenosis, or CT perfusion evidence of ischemia. 2. These results were communicated to Dr. Arora at 10:14 AM on 11/22/2024 by text page via the [facility] messaging system. 3. Mild stenosis of the proximal right petrous ICA and proximal left cervical ICA in the setting of calcified and noncalcified plaque. 4. 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 5. Pulmonary emphysema is an independent risk factor for lung cancer. Recommend consideration for evaluation for a low-dose CT lung cancer screening program. Electronically signed by: Prentice Spade MD 11/22/2024 10:40 AM EST RP Workstation: [id number]   CT HEAD CODE STROKE WO CONTRAST (LKW 0-4.5h, LVO 0-24h) Result Date: 11/22/2024 EXAM: CT HEAD WITHOUT CONTRAST 11/22/2024 09:55:13 AM TECHNIQUE: CT of the head was performed without the administration of intravenous contrast. Automated exposure control, iterative reconstruction, and/or weight based adjustment of the mA/kV was utilized to reduce the radiation dose to as low as reasonably achievable. COMPARISON: CT head 02/01/2011. CLINICAL HISTORY: Neuro deficit, acute, stroke suspected. Acute neurologic deficit; stroke suspected. FINDINGS: BRAIN AND VENTRICLES: No acute intracranial hemorrhage. 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 Age-indeterminate interval small infarction right thalamus. Mild parenchymal volume loss progressed since prior. There is mild scattered white matter hypodensities which are nonspecific but most commonly represent chronic microvascular ischemic changes. No evidence of acute territorial infarct. No hydrocephalus. No extra-axial collection. No mass effect or midline shift. ORBITS: No acute abnormality. SINUSES: No acute abnormality. SOFT TISSUES AND SKULL: No acute soft tissue abnormality. No skull fracture. [place] Stroke Program Early CT Score (ASPECTS): 10 IMPRESSION: 1. No acute intracranial hemorrhage or acute territorial infarction. 2. ASPECTS 10. 3. Age-indeterminate small infarction in the right thalamus. MRI would be useful to further assess. 4. These results were communicated to Dr. Voncile at 10:10 on November 22, 2024 by text page via the [facility] messaging system. Electronically signed by: Prentice Spade MD 11/22/2024 10:11 AM EST RP Workstation: [id number]    {Document cardiac monitor,  telemetry assessment procedure when appropriate:32947} Procedures   Medications Ordered in the ED  iohexol  (OMNIPAQUE ) 350 MG/ML injection 100 mL (100 mLs Intravenous Contrast Given 11/22/24 0959)  aspirin  EC tablet 325 mg (325 mg Oral Given 11/22/24 1123)   CRITICAL CARE Performed by: Fairy Sermon Total critical care time: 45 minutes Critical care time was exclusive of separately billable procedures and treating other patients. Critical care was necessary to treat or prevent imminent or life-threatening deterioration. Critical care was time spent personally by me on the following activities: development of treatment plan with patient and/or surrogate as well as nursing, discussions with consultants, evaluation of patient's response to treatment, examination of patient, obtaining history from patient or surrogate, ordering and performing treatments and interventions, ordering and review of laboratory studies, ordering and review of radiographic studies, pulse oximetry and re-evaluation of patient's condition.    {Click here for ABCD2, HEART and other calculators REFRESH Note before signing:1}                              Medical Decision Making Amount and/or Complexity of Data Reviewed Labs: ordered. Radiology: ordered.  Risk OTC drugs. Prescription drug management. Decision regarding hospitalization.   Patient with small vessel stroke.  He is admitted to medicine.  Patient has been seen by neurology Dr. Deedra  {Document critical care time when appropriate  Document review of labs and clinical decision tools ie CHADS2VASC2,  etc  Document your independent review of radiology images and any outside records  Document your discussion with family members, caretakers and with consultants  Document social determinants of health affecting pt's care  Document your decision making why or why not admission, treatments were needed:32947:::1}   Final diagnoses:  Acute ischemic stroke [facility])     ED Discharge Orders     None        "

## 2024-11-22 NOTE — Consult Note (Signed)
 [facility] Telemedicine Consult   Requesting Provider: Suzette Consult Participants: Dr. RONAL Lav Location of the provider: [facility] Location of the patient: [facility] emergency department bed #14  This consult was provided via telemedicine with 2-way video and audio communication. The patient/family was informed that care would be provided in this way and agreed to receive care in this manner.   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  Brought to the emergency department at [facility].  Code stroke activated.  Evaluated on the camera. Confirms last known well at 12:30 AM today. Examination listed below Denies chest pain shortness of breath.  Denies fevers chills or recent sickness or illness   Past Medical History:  Diagnosis Date   Diabetes mellitus without complication (HCC)    Hearing loss    Hyperlipidemia    Hypertension    Peripheral arterial disease    Renal disorder     Current Medications[1]    LKW: 0030 hrs IV thrombolysis given?: No, OSW IR Thrombectomy? No, no ELVO Modified Rankin Scale: 0-Completely asymptomatic and back to baseline post- stroke Time of teleneurologist evaluation: 9:41 AM Time of page 9:40 AM  Exam: There were no vitals filed for this visit.  General: Well-developed well-nourished elderly man Neurological exam He is awake alert oriented x 3 Speech is mildly dysarthric There is no evidence of aphasia Follows all commands Cranial nerves: Pupils appear equal round react light, extraocular movements are intact, visual fields are full, face intermittently distorted-has right lower facial weakness on the camera evaluation.  Tongue and palate midline. Motor  examination with mild drift in the right upper extremity.  Arm does not fall to bed in 10 seconds but drift.  Right lower extremity is barely antigravity.  Left side is full strength Sensation appears to be intact to light touch No gross dysmetria    NIHSS 1A: Level of Consciousness - 0 1B: Ask Month and Age - 0 1C: 'Blink Eyes' & 'Squeeze Hands' - 0 2: Test Horizontal Extraocular Movements - 0 3: Test Visual Fields - 0 4: Test Facial Palsy - 1 5A: Test Left Arm Motor Drift - 0 5B: Test Right Arm Motor Drift - 1 6A: Test Left Leg Motor Drift - 0 6B: Test Right Leg Motor Drift - 2 7: Test Limb Ataxia - 0 8: Test Sensation - 0 9: Test Language/Aphasia- 0 10: Test Dysarthria - 1 11: Test Extinction/Inattention - 0 NIHSS score: 5  Imaging Reviewed:  CT head without contrast-no acute findings CT angio head and neck-preliminary review negative for LVO CT perfusion study negative for perfusion deficit  Labs reviewed in epic and pertinent values follow: CBC    Component Value Date/Time   WBC 5.5 01/07/2023 0904   RBC 4.49 01/07/2023 0904   HGB 13.9 03/25/2023 0558   HCT 41.0 03/25/2023 0558   PLT 150 01/07/2023 0904   MCV 91.5 01/07/2023 0904   MCH 31.0 01/07/2023 0904   MCHC 33.8 01/07/2023 0904   RDW 13.5 01/07/2023 0904   LYMPHSABS 1.4 01/07/2023 0904   MONOABS 1.0 01/07/2023 0904   EOSABS 0.0 01/07/2023 0904   BASOSABS 0.0 01/07/2023 0904   CMP     Component Value Date/Time   NA 142 03/25/2023 0558   K 4.1 03/25/2023  0558   CL 106 03/25/2023 0558   CO2 21 (L) 01/07/2023 0841   GLUCOSE 125 (H) 03/25/2023 0558   BUN 20 03/25/2023 0558   CREATININE 1.30 (H) 03/25/2023 0558   CALCIUM  8.9 01/07/2023 0841   PROT 6.7 06/20/2009 0107   ALBUMIN 3.5 06/20/2009 0107   AST 37 06/20/2009 0107   ALT 30 06/20/2009 0107   ALKPHOS 87 06/20/2009 0107   BILITOT 0.3 06/20/2009 0107   GFRNONAA 57 (L) 01/07/2023 0841   GFRAA >60 09/04/2017 1324     Assessment: 79 year old  man past history of diabetes mellitus and hyperlipidemia as well as liver fibrosis/NASH-last well at 12:30 AM, woke up with right sided weakness, slurred speech, EMS brought to hospital with concern for stroke.  On examination, has right-sided deficits in strength and also facial asymmetry and dysarthria. No evidence of visual/aphasia/neglect symptoms/signs-likely small vessel infarct, cannot rule out a stroke mimic. Outside the window for TNKase No ELVO for ED consideration  Impression: Evaluate for small vessel etiology acute ischemic stroke versus stroke mimic less likely  Recommendations:  Admit to hospitalist Frequent neurochecks Telemetry He is on Plavix  at home-- I would do aspirin  and Plavix  for now High intensity statin for goal LDL less than 70 Check 2D echo, A1c, lipid panel MRI of the brain without contrast and therapy assessments N.p.o. until cleared by swallow evaluation Check chest x-ray and urinalysis. Given his history of liver dysfunction in the past-check liver function test and ammonia levels as well. Toxicology screen  Discussed with EDP-Dr. Suzette  -- Eligio Lav, MD Neurologist [facility] Pager: [phone]     [1] No current facility-administered medications for this encounter.  Current Outpatient Medications:    amLODipine  (NORVASC ) 10 MG tablet, Take 10 mg by mouth daily., Disp: , Rfl: 2   atorvastatin  (LIPITOR) 10 MG tablet, Take 10 mg by mouth daily., Disp: , Rfl:    Blood Glucose Monitoring Suppl (ONE TOUCH ULTRA 2) w/Device KIT, , Disp: , Rfl:    cholecalciferol  (VITAMIN D ) 25 MCG (1000 UNIT) tablet, Take 1,000 Units by mouth daily., Disp: , Rfl:    ciprofloxacin  (CILOXAN ) 0.3 % ophthalmic solution, 3 drops 2 (two) times daily. Place in both Ears, Disp: , Rfl:    cloNIDine (CATAPRES) 0.1 MG tablet, Take 0.1 mg by mouth daily., Disp: , Rfl:    clopidogrel  (PLAVIX ) 75 MG tablet, Take 1 tablet (75 mg total) by mouth daily., Disp: 30  tablet, Rfl: 10   dexamethasone  (DECADRON ) 0.1 % ophthalmic solution, 2 drops 2 (two) times daily. Place in both Ears, Disp: , Rfl:    Lancets (ONETOUCH DELICA PLUS LANCET33G) MISC, , Disp: , Rfl:    Magnesium 250 MG TABS, Take 250 mg by mouth daily., Disp: , Rfl:    Multiple Vitamins-Minerals (CENTRUM ADULTS) TABS, Take 1 tablet by mouth daily., Disp: , Rfl:    nicotine  (NICODERM CQ  - DOSED IN MG/24 HOURS) 21 mg/24hr patch, Place 21 mg onto the skin every other day., Disp: , Rfl:    Omega-3 Fatty Acids (OMEGA III EPA+DHA) 1000 MG CAPS, Krill oil, Disp: , Rfl:    ONETOUCH ULTRA test strip, , Disp: , Rfl:    oxyCODONE  (ROXICODONE ) 5 MG immediate release tablet, Take 1 tablet (5 mg total) by mouth every 4 (four) hours as needed for severe pain (pain score 7-10)., Disp: 10 tablet, Rfl: 0   predniSONE  (DELTASONE ) 50 MG tablet, Take 1 tablet (50 mg total) by mouth daily., Disp: 5 tablet, Rfl: 0

## 2024-11-22 NOTE — Progress Notes (Signed)
 Lynwood Kipper NP notified of change in NIH stroke scale.

## 2024-11-22 NOTE — Hospital Course (Signed)
 79 year old male with a history of diabetes mellitus type 2, hypertension, hyperlipidemia, hepatic fibrosis, tobacco abuse presenting with dysarthria and right arm and right leg weakness when he woke up at 8 AM on 11/22/2024.  The patient went to bed in his usual state of health around 12:30 AM on 11/22/2024.  Because of his deficits, EMS was activated and code stroke was called.  The patient was seen by neurology in the emergency department and he was deemed not to be a tPA candidate due to being outside the window of time.  Patient denies fevers, chills, headache, chest pain, dyspnea, nausea, vomiting, diarrhea, abdominal pain, dysuria, hematuria, hematochezia, and melena.  The patient states that his deficits have been relatively stable since he woke up in the morning up 11/22/24. Notably, he smokes about 1 pack/day for at least 35 years.  He denies any alcohol or illicit drug use.  In the ED, the patient was afebrile hemodynamically stable with oxygen saturation 100% room air.  WBC 6.1, hemoglobin 13.1, platelet 167.  Sodium 141, potassium 4.3, bicarbonate 22, serum creatinine 1.48.  LFTs are unremarkable.  Ammonia 32.  Alcohol level negative.  Chest x-ray is negative.  CT brain showed age-indeterminate right thalamic infarct.  CTA head and neck was negative for LVO.  There was negative for hemodynamically significant stenosis.  Patient was admitted for further evaluation treatment of his stroke.

## 2024-11-22 NOTE — H&P (Addendum)
 " History and Physical    Patient: Jorge Jefferson FMW:[id number] DOB: March 30, 1946 DOA: 11/22/2024 DOS: the patient was seen and examined on 11/22/2024 PCP: Leigh Lung, MD  Patient coming from: Home  Chief Complaint:  Chief Complaint  Patient presents with   Code Stroke    LKW 0030    HPI: Jorge Jefferson is a 79 year old male with a history of diabetes mellitus type 2, hypertension, hyperlipidemia, hepatic fibrosis, tobacco abuse presenting with dysarthria and right arm and right leg weakness when he woke up at 8 AM on 11/22/2024.  The patient went to bed in his usual state of health around 12:30 AM on 11/22/2024.  Because of his deficits, EMS was activated and code stroke was called.  The patient was seen by neurology in the emergency department and he was deemed not to be a tPA candidate due to being outside the window of time.  Patient denies fevers, chills, headache, chest pain, dyspnea, nausea, vomiting, diarrhea, abdominal pain, dysuria, hematuria, hematochezia, and melena.  The patient states that his deficits have been relatively stable since he woke up in the morning up 11/22/24. Notably, he smokes about 1 pack/day for at least 35 years.  He denies any alcohol or illicit drug use.  In the ED, the patient was afebrile hemodynamically stable with oxygen saturation 100% room air.  WBC 6.1, hemoglobin 13.1, platelet 167.  Sodium 141, potassium 4.3, bicarbonate 22, serum creatinine 1.48.  LFTs are unremarkable.  Ammonia 32.  Alcohol level negative.  Chest x-ray is negative.  CT brain showed age-indeterminate right thalamic infarct.  CTA head and neck was negative for LVO.  There was negative for hemodynamically significant stenosis.  Patient was admitted for further evaluation treatment of his stroke.  Review of Systems: As mentioned in the history of present illness. All other systems reviewed and are negative. Past Medical History:  Diagnosis Date   Diabetes mellitus without complication  (HCC)    Hearing loss    Hyperlipidemia    Hypertension    Peripheral arterial disease    Renal disorder    Past Surgical History:  Procedure Laterality Date   ABDOMINAL AORTOGRAM W/LOWER EXTREMITY N/A 03/25/2023   Procedure: ABDOMINAL AORTOGRAM W/LOWER EXTREMITY;  Surgeon: Gretta Lonni JINNY, MD;  Location: [facility];  Service: Cardiovascular;  Laterality: N/A;   CATARACT EXTRACTION W/PHACO Left 11/05/2023   Procedure: CATARACT EXTRACTION PHACO AND INTRAOCULAR LENS PLACEMENT (IOC);  Surgeon: Harrie Agent, MD;  Location: [facility];  Service: Ophthalmology;  Laterality: Left;  CDE: 6.25   CATARACT EXTRACTION W/PHACO Right 11/19/2023   Procedure: CATARACT EXTRACTION PHACO AND INTRAOCULAR LENS PLACEMENT (IOC);  Surgeon: Harrie Agent, MD;  Location: [facility];  Service: Ophthalmology;  Laterality: Right;  CDE: 7.22   PERIPHERAL VASCULAR INTERVENTION  03/25/2023   Procedure: PERIPHERAL VASCULAR INTERVENTION;  Surgeon: Gretta Lonni JINNY, MD;  Location: [facility];  Service: Cardiovascular;;   Social History:  reports that he has been smoking cigarettes. He has a 30 pack-year smoking history. He has never used smokeless tobacco. He reports current alcohol use. He reports current drug use. Drug: Marijuana.  Allergies[1]  History reviewed. No pertinent family history.  Prior to Admission medications  Medication Sig Start Date End Date Taking? Authorizing Provider  amLODipine  (NORVASC ) 10 MG tablet Take 10 mg by mouth daily. 04/03/15   [provider]  atorvastatin  (LIPITOR) 10 MG tablet Take 10 mg by mouth daily. 08/08/21   [provider]  Blood Glucose  Monitoring Suppl (ONE TOUCH ULTRA 2) w/Device KIT  11/28/19   [provider]  cholecalciferol  (VITAMIN D ) 25 MCG (1000 UNIT) tablet Take 1,000 Units by mouth daily.    [provider]  ciprofloxacin  (CILOXAN ) 0.3 % ophthalmic solution 3 drops 2 (two) times daily. Place in both Ears 07/21/19    [provider]  cloNIDine (CATAPRES) 0.1 MG tablet Take 0.1 mg by mouth daily. 09/03/21   [provider]  clopidogrel  (PLAVIX ) 75 MG tablet Take 1 tablet (75 mg total) by mouth daily. 05/10/24 04/05/25  Gretta Lonni PARAS, MD  dexamethasone  (DECADRON ) 0.1 % ophthalmic solution 2 drops 2 (two) times daily. Place in both Ears 01/25/23   [provider]  Lancets [facility] CATHRYNE PLUS [place]) MISC  11/28/19   [provider]  Magnesium 250 MG TABS Take 250 mg by mouth daily.    [provider]  Multiple Vitamins-Minerals (CENTRUM ADULTS) TABS Take 1 tablet by mouth daily.    [provider]  nicotine  (NICODERM CQ  - DOSED IN MG/24 HOURS) 21 mg/24hr patch Place 21 mg onto the skin every other day. 07/29/20   [provider]  Omega-3 Fatty Acids (OMEGA III EPA+DHA) 1000 MG CAPS Krill oil    [provider]  [facility] ULTRA test strip  11/28/19   [provider]  oxyCODONE  (ROXICODONE ) 5 MG immediate release tablet Take 1 tablet (5 mg total) by mouth every 4 (four) hours as needed for severe pain (pain score 7-10). 05/24/24   Raford Lenis, MD  predniSONE  (DELTASONE ) 50 MG tablet Take 1 tablet (50 mg total) by mouth daily. 05/24/24   Raford Lenis, MD    Physical Exam: Vitals:   11/22/24 1020 11/22/24 1025 11/22/24 1035 11/22/24 1050  BP: (!) 180/91 (!) 170/116 (!) 197/71 (!) 179/94  Pulse: 65 63 62 63  Resp: 16 15 16 17   Temp:      TempSrc:      SpO2: 96% 98% 100% 99%  Weight:      Height:       GENERAL:  A&O x 3, NAD, well developed, cooperative, follows commands HEENT: /AT, No thrush, No icterus, No oral ulcers Neck:  No neck mass, No meningismus, soft, supple CV: RRR, no S3, no S4, no rub, no JVD Lungs:  CTA, no wheeze, no rhonchi, good air movement Abd: soft/NT +BS, nondistended Ext: No edema, no lymphangitis, no cyanosis, no rashes Neuro:  CN II-XII intact, strength 3/5 in RUE, 3-/5 RLE, strength 4/5 LUE, LLE;  sensation intact bilateral; no dysmetria; babinski equivocal  Data Reviewed: Data reviewed above in the history  Assessment and Plan: Acute ischemic stroke -Appreciate Neurology Consult -PT/OT evaluation -Speech therapy eval -CT brain--age indeterminant R-thalamic infarct -MRI brain-- -CTA H&N--negative LVO, negative hemodynamic significant stenosis, hypoplastic right cervical VA.  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 -Echo-- -LDL-- -HbA1C-- -Antiplatelet--ASA 81 mg, plavix  75 mg  Peripheral vascular disease - On Plavix  PTA - Status post left external iliac stent 03/25/2023 - Follow-up vascular surgery  CKD 3a - Baseline creatinine 1.2-1.4 - Monitor BMP  Tobacco abuse - cessation discussed  Essential hypertension - Holding amlodipine  and clonidine to allow for permissive hypertension - hydralazine  for SBP >220  Mixed hyperlipidemia - Continue statin - Obtain lipid panel  Hyperglycemia - Check A1c  Obesity - BMI 30.48 - Lifestyle modification   Advance Care Planning: FULL  Consults: neurology  Family Communication: wife 2/4  Severity of Illness: The appropriate patient status for this  patient is OBSERVATION. Observation status is judged to be reasonable and necessary in order to provide the required intensity of service to ensure the patient's safety. The patient's presenting symptoms, physical exam findings, and initial radiographic and laboratory data in the context of their medical condition is felt to place them at decreased risk for further clinical deterioration. Furthermore, it is anticipated that the patient will be medically stable for discharge from the hospital within 2 midnights of admission.   Author: Alm Schneider, MD 11/22/2024 12:14 PM  For on call review [email or web address].      [1] No Known Allergies  "

## 2024-11-22 NOTE — ED Triage Notes (Signed)
 Pt went to bed at 0030. Pt awoke at 0800 with right sided weakness and right sided facial droop. STROKE code initiated.

## 2024-11-22 NOTE — Evaluation (Signed)
 Speech Language Pathology Evaluation Patient Details Name: Jorge Jefferson MRN: [id number] DOB: 08-Feb-1946 Today's Date: 11/22/2024 Time: 1510-1535 SLP Time Calculation (min) (ACUTE ONLY): 25 min  Problem List:  Patient Active Problem List   Diagnosis Date Noted   Acute ischemic stroke (HCC) 11/22/2024   Mixed hyperlipidemia 11/22/2024   Chronic kidney disease, stage 3a (HCC) 11/22/2024   Mild protein-calorie malnutrition 03/28/2023   Perforation of right tympanic membrane 03/18/2023   Atherosclerosis of native arteries of extremity with intermittent claudication 03/01/2023   Eustachian tube dysfunction, bilateral 01/25/2023   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) 02/09/2022   Constipation 11/07/2021   Screening for malignant neoplasm of colon 11/07/2021   Advanced hepatic fibrosis 09/17/2021   Elevated ferritin level 09/17/2021   Hyperlipidemia 09/17/2021   History of colonic polyps 09/17/2021   Steatosis of liver 09/17/2021   Bilateral sensorineural hearing loss 03/15/2014   Chronic eustachian tube dysfunction 03/15/2014   Essential hypertension 03/15/2014   Mixed hearing loss of right ear 03/15/2014   Nasal septal perforation 03/15/2014   Past Medical History:  Past Medical History:  Diagnosis Date   Diabetes mellitus without complication (HCC)    Hearing loss    Hyperlipidemia    Hypertension    Peripheral arterial disease    Renal disorder    Past Surgical History:  Past Surgical History:  Procedure Laterality Date   ABDOMINAL AORTOGRAM W/LOWER EXTREMITY N/A 03/25/2023   Procedure: ABDOMINAL AORTOGRAM W/LOWER EXTREMITY;  Surgeon: Gretta Lonni JINNY, MD;  Location: [facility];  Service: Cardiovascular;  Laterality: N/A;   CATARACT EXTRACTION W/PHACO Left 11/05/2023   Procedure: CATARACT EXTRACTION PHACO AND INTRAOCULAR LENS PLACEMENT (IOC);  Surgeon: Harrie Agent, MD;  Location: [facility];  Service:  Ophthalmology;  Laterality: Left;  CDE: 6.25   CATARACT EXTRACTION W/PHACO Right 11/19/2023   Procedure: CATARACT EXTRACTION PHACO AND INTRAOCULAR LENS PLACEMENT (IOC);  Surgeon: Harrie Agent, MD;  Location: [facility];  Service: Ophthalmology;  Laterality: Right;  CDE: 7.22   PERIPHERAL VASCULAR INTERVENTION  03/25/2023   Procedure: PERIPHERAL VASCULAR INTERVENTION;  Surgeon: Gretta Lonni JINNY, MD;  Location: [facility];  Service: Cardiovascular;;   HPI:  Jorge Jefferson is a 79 year old male with a history of diabetes mellitus type 2, hypertension, hyperlipidemia, hepatic fibrosis, tobacco abuse presenting with dysarthria and right arm and right leg weakness when he woke up at 8 AM on 11/22/2024.  The patient went to bed in his usual state of health around 12:30 AM on 11/22/2024.  Because of his deficits, EMS was activated and code stroke was called.  The patient was seen by neurology in the emergency department and he was deemed not to be a tPA candidate due to being outside the window of time.  CT head indicated R thalamic infarct.  MRI pending. Pt passed [facility] swallow screen.  ST consulted for speech/language cognitive assessment.   Assessment / Plan / Recommendation Clinical Impression  Recommend ST f/u for dysarthria tx/cognitive-linguistic deficits during acute stay.  Inpatient rehab referral would be beneficial.  Thank you for this consult.  Jorge Jefferson was seen for a speech/language cognitive assessment with dysarthria observed during simple conversational turns c/b imprecise articulation and low vocal intensity/hypophonic vocal quality observed.  Decreased anticipatory/intellectual awareness noted during evaluation and simple problem solving task decreased (ie: using call bell to alert staff with needs).  Pt restless and impulsive and min agitated during simple task completion d/t decreased sustained attention.  OME revealed R decreased sensory impairment/ROM with buccal retention of  solids from lunch tray.  Pt removed remaining bolus with min verbal cues to increase awareness.  Pt passed [facility] swallow screen, but pt may benefit from a clinical swallow evaluation if dysphagia symptoms noted during meals/overt s/s of aspiration observed by staff d/t impaired cognition/awareness.  Pt Ox3 (place,time and self, but not situation without cues provided by SLP).  Pt would benefit from continued ST during acute stay for dysarthria/cognitive-linguistic tx.    SLP Assessment  SLP Recommendation/Assessment: Patient needs continued Speech Language Pathology Services SLP Visit Diagnosis: Dysarthria and anarthria (R47.1);Cognitive communication deficit (R41.841)     Assistance Recommended at Discharge  Frequent or constant Supervision/Assistance  Functional Status Assessment Patient has had a recent decline in their functional status and demonstrates the ability to make significant improvements in function in a reasonable and predictable amount of time.  Frequency and Duration min 2x/week  1 week      SLP Evaluation Cognition  Overall Cognitive Status: Impaired/Different from baseline Arousal/Alertness: Awake/alert Orientation Level: Oriented to person;Oriented to place;Oriented to time;Disoriented to situation Attention: Sustained Sustained Attention: Impaired Sustained Attention Impairment: Verbal basic;Functional basic Awareness: Impaired Awareness Impairment: Anticipatory impairment Problem Solving: Impaired Problem Solving Impairment: Verbal basic;Functional basic;Other (comment) (using call bell for A) Behaviors: Impulsive;Restless Safety/Judgment: Impaired       Comprehension  Auditory Comprehension Overall Auditory Comprehension: Appears within functional limits for tasks assessed Yes/No Questions: Within Functional Limits Commands: Not tested Conversation: Simple Interfering Components: Attention;Other (comment) (has B hearing aids) EffectiveTechniques: Increased  volume;Repetition;Visual/Gestural cues Visual Recognition/Discrimination Discrimination: Not tested Reading Comprehension Reading Status: Not tested    Expression Expression Primary Mode of Expression: Verbal Verbal Expression Overall Verbal Expression: Appears within functional limits for tasks assessed Level of Generative/Spontaneous Verbalization: Conversation Naming: Not tested Pragmatics: Impairment Impairments: Topic maintenance Interfering Components: Attention;Speech intelligibility Written Expression Dominant Hand: Right Written Expression: Not tested (d/t dominant hand)   Oral / Motor  Oral Motor/Sensory Function Overall Oral Motor/Sensory Function: Mild impairment Facial ROM: Reduced right Facial Symmetry: Abnormal symmetry right Facial Strength: Reduced right Facial Sensation: Reduced right Lingual Symmetry: Within Functional Limits Lingual Sensation: Reduced Motor Speech Overall Motor Speech: Other (comment) (DTA) Respiration: Impaired Level of Impairment: Sentence Phonation: Low vocal intensity Resonance: Within functional limits Articulation: Impaired Level of Impairment: Word Intelligibility: Intelligibility reduced Word: 50-74% accurate Phrase: 50-74% accurate Sentence: 50-74% accurate Conversation: 25-49% accurate Motor Planning: Not tested Motor Speech Errors: Not applicable Effective Techniques: Over-articulate;Slow rate;Increased vocal intensity            Pat Noah Pelaez,M.S.,CCC-SLP 11/22/2024, 3:57 PM

## 2024-11-23 ENCOUNTER — Observation Stay (HOSPITAL_COMMUNITY)

## 2024-11-23 ENCOUNTER — Other Ambulatory Visit (HOSPITAL_COMMUNITY): Payer: Self-pay | Admitting: *Deleted

## 2024-11-23 DIAGNOSIS — F1290 Cannabis use, unspecified, uncomplicated: Secondary | ICD-10-CM

## 2024-11-23 DIAGNOSIS — I6389 Other cerebral infarction: Secondary | ICD-10-CM

## 2024-11-23 DIAGNOSIS — F17200 Nicotine dependence, unspecified, uncomplicated: Secondary | ICD-10-CM

## 2024-11-23 DIAGNOSIS — I10 Essential (primary) hypertension: Secondary | ICD-10-CM

## 2024-11-23 LAB — ECHOCARDIOGRAM COMPLETE
Area-P 1/2: 2.2 cm2
Calc EF: 66 %
Height: 81 in
S' Lateral: 3.2 cm
Single Plane A2C EF: 69.2 %
Single Plane A4C EF: 57.8 %
Weight: 4550.29 [oz_av]

## 2024-11-23 LAB — LIPID PANEL
Cholesterol: 114 mg/dL (ref 0–200)
HDL: 38 mg/dL — ABNORMAL LOW
LDL Cholesterol: 47 mg/dL (ref 0–99)
Total CHOL/HDL Ratio: 3 ratio
Triglycerides: 145 mg/dL
VLDL: 29 mg/dL (ref 0–40)

## 2024-11-23 MED ORDER — ASPIRIN 81 MG PO CHEW
81.0000 mg | CHEWABLE_TABLET | Freq: Every day | ORAL | Status: AC
  Administered 2024-11-23 – 2024-11-24 (×2): 81 mg via ORAL
  Filled 2024-11-23 (×2): qty 1

## 2024-11-23 NOTE — Progress Notes (Signed)
 Speech Language Pathology Treatment: Cognitive-Linguistic  Patient Details Name: Jorge Jefferson MRN: [id number] DOB: 06/19/1946 Today's Date: 11/23/2024 Time: 1250-1310 SLP Time Calculation (min) (ACUTE ONLY): 20 min  Assessment / Plan / Recommendation Clinical Impression  Recommend continue ST f/u for dysarthria/cognition goals during acute stay. Recommend CIR referral.    Pt seen for a f/u speech/cognitive/linguistic treatment focusing on dysarthria teach back techniques for slow, precise articulation and increased vocal intensity/breath support during phrases/conversation to increase overall intelligibility.  Pt able to recall 75% of technique and instituted this in session with improved intelligibility.  Pt with improved orientation with Ox4 and judgment/awareness improved for safety precautions (ie: using call bell, requesting A with mobility, etc.); decreased R awareness/sensory input observed as pt was seen for tx session post lunch tray with bolus remaining in R lateral sulcus without verbal/visual cueing provided.  Recommendation made for pt to place food on left side with increased sensation to decrease pocketing of boluses during meals.  Wife in attendance and both agreeable to goals set for ST for speech/cognition.  ST will continue to f/u in acute setting with ST recommended at next venue of care.     HPI HPI: Jorge Jefferson is a 79 year old male with a history of diabetes mellitus type 2, hypertension, hyperlipidemia, hepatic fibrosis, tobacco abuse presenting with dysarthria and right arm and right leg weakness when he woke up at 8 AM on 11/22/2024. The patient went to bed in his usual state of health around 12:30 AM on 11/22/2024. Because of his deficits, EMS was activated and code stroke was called. The patient was seen by neurology in the emergency department and he was deemed not to be a tPA candidate due to being outside the window of time. CT head indicated R thalamic infarct. Pt  passed [facility] swallow screen. ST consulted for speech/language cognitive assessment. ST f/u for cognitive/linguistic tx.      SLP Plan  Continue with current plan of care        Swallow Evaluation Recommendations    N/a     Recommendations   Inpatient rehab                Rehab consult     Frequent or constant Supervision/Assistance Dysarthria and anarthria (R47.1);Cognitive communication deficit (R41.841)     Continue with current plan of care     Pat Johnye Kist,M.S.,CCC-SLP  11/23/2024, 1:15 PM

## 2024-11-23 NOTE — Plan of Care (Signed)
" °  Problem: Acute Rehab PT Goals(only PT should resolve) Goal: Pt will Roll Supine to Side Outcome: Progressing Flowsheets (Taken 11/23/2024 1444) Pt will Roll Supine to Side: with mod assist Goal: Pt Will Go Supine/Side To Sit Outcome: Progressing Flowsheets (Taken 11/23/2024 1444) Pt will go Supine/Side to Sit: with moderate assist Goal: Patient Will Perform Sitting Balance Outcome: Progressing Flowsheets (Taken 11/23/2024 1444) Patient will perform sitting balance:  with minimal assist  with bilateral UE support Goal: Pt Will Transfer Bed To Chair/Chair To Bed Outcome: Progressing Flowsheets (Taken 11/23/2024 1444) Pt will Transfer Bed to Chair/Chair to Bed: with max assist Goal: Pt Will Ambulate Outcome: Progressing Flowsheets (Taken 11/23/2024 1444) Pt will Ambulate:  10 feet  with moderate assist  with maximum assist  with rolling walker    Lang Ada, PT, DPT [facility] Office: [phone] 2:45 PM, 11/23/24  "

## 2024-11-23 NOTE — Progress Notes (Signed)
 MD Natalie evaluated pt and spoke with pt's wife via Tele-Neuro visit.

## 2024-11-23 NOTE — Progress Notes (Signed)
 "          PROGRESS NOTE  Jorge Jefferson FMW:[id number] DOB: 09-07-1946 DOA: 11/22/2024 PCP: Jorge Lung, MD  Brief History:  79 year old male with a history of diabetes mellitus type 2, hypertension, hyperlipidemia, hepatic fibrosis, tobacco abuse presenting with dysarthria and right arm and right leg weakness when he woke up at 8 AM on 11/22/2024.  The patient went to bed in his usual state of health around 12:30 AM on 11/22/2024.  Because of his deficits, EMS was activated and code stroke was called.  The patient was seen by neurology in the emergency department and he was deemed not to be a tPA candidate due to being outside the window of time.  Patient denies fevers, chills, headache, chest pain, dyspnea, nausea, vomiting, diarrhea, abdominal pain, dysuria, hematuria, hematochezia, and melena.  The patient states that his deficits have been relatively stable since he woke up in the morning up 11/22/24. Notably, he smokes about 1 pack/day for at least 35 years.  He denies any alcohol or illicit drug use.  In the ED, the patient was afebrile hemodynamically stable with oxygen saturation 100% room air.  WBC 6.1, hemoglobin 13.1, platelet 167.  Sodium 141, potassium 4.3, bicarbonate 22, serum creatinine 1.48.  LFTs are unremarkable.  Ammonia 32.  Alcohol level negative.  Chest x-ray is negative.  CT brain showed age-indeterminate right thalamic infarct.  CTA head and neck was negative for LVO.  There was negative for hemodynamically significant stenosis.  Patient was admitted for further evaluation treatment of his stroke.   Assessment/Plan:  Acute ischemic stroke -Appreciate Neurology Consult -PT/OT evaluation -Speech therapy eval -CT brain--age indeterminant R-thalamic infarct -MRI brain--95mm acute left thalamic infarct -CTA H&N--negative LVO, negative hemodynamic significant stenosis, hypoplastic right cervical VA.  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 -2/5 Echo--EF 60-65%, no  WMA, G2DD, normal RVF, no PFO -LDL--47 -HbA1C--5.2 -Antiplatelet--ASA 81 mg, plavix  75 mg x 3 months, then ASA 81 mg daily   Peripheral vascular disease - On Plavix  PTA - Status post left external iliac stent 03/25/2023 - Follow-up vascular surgery   CKD 3a - Baseline creatinine 1.2-1.4 - Monitor BMP   Tobacco abuse - cessation discussed   Essential hypertension - Holding amlodipine  and clonidine to allow for permissive hypertension - hydralazine  for SBP >220   Mixed hyperlipidemia - Continue statin - Obtain lipid panel--LDL 47   Hyperglycemia - Check A1c 5.2   Obesity - BMI 30.48 - Lifestyle modification        Family Communication:   wife at bedside 2/5  Consultants:  neurology  Code Status:  FULL   DVT Prophylaxis:   Rodessa Lovenox    Procedures: As Listed in Progress Note Above  Antibiotics: None    Subjective: Pt complains of right arm > right leg weakness.  Denies cp, sob, n/v/d, abd pain, headache, f/c  Objective: Vitals:   11/23/24 0531 11/23/24 0900 11/23/24 1300 11/23/24 1618  BP: (!) 171/60 (!) 178/84 (!) 211/83 (!) 192/79  Pulse: 67 79 80 82  Resp: 17 18  17   Temp: 98.6 F (37 C) 98.9 F (37.2 C) 98.4 F (36.9 C) 98.6 F (37 C)  TempSrc: Oral Oral Oral Oral  SpO2: 99% 97% 95% 97%  Weight:      Height:        Intake/Output Summary (Last 24 hours) at 11/23/2024 1813 Last data filed at 11/23/2024 1600 Gross per 24 hour  Intake 1170.72 ml  Output 1250 ml  Net -  79.28 ml   Weight change:  Exam:  General:  Pt is alert, follows commands appropriately, not in acute distress HEENT: No icterus, No thrush, No neck mass, [place]/AT Cardiovascular: RRR, S1/S2, no rubs, no gallops Respiratory: bibasilar rales. No wheeze Abdomen: Soft/+BS, non tender, non distended, no guarding Extremities: No edema, No lymphangitis, No petechiae, No rashes, no synovitis Neuro:  CN II-XII intact, strength 3-/5 in RUE, RLE, strength 4/5 LUE, LLE; sensation intact  bilateral; no dysmetria; babinski equivocal    Data Reviewed: I have personally reviewed following labs and imaging studies Basic Metabolic Panel: Recent Labs  Lab 11/22/24 0940  NA 141  K 4.3  CL 109  CO2 22  GLUCOSE 131*  BUN 17  CREATININE 1.48*  CALCIUM  9.5   Liver Function Tests: Recent Labs  Lab 11/22/24 0940  AST 23  ALT 16  ALKPHOS 110  BILITOT 0.4  PROT 6.2*  ALBUMIN 3.6   No results for input(s): LIPASE, AMYLASE in the last 168 hours. Recent Labs  Lab 11/22/24 1028  AMMONIA 32   Coagulation Profile: Recent Labs  Lab 11/22/24 0940  INR 1.0   CBC: Recent Labs  Lab 11/22/24 0940  WBC 6.1  NEUTROABS 4.3  HGB 13.1  HCT 39.6  MCV 93.6  PLT 167   Cardiac Enzymes: No results for input(s): CKTOTAL, CKMB, CKMBINDEX, TROPONINI in the last 168 hours. BNP: Invalid input(s): POCBNP CBG: Recent Labs  Lab 11/22/24 0940  GLUCAP 138*   HbA1C: Recent Labs    11/22/24 0940  HGBA1C 5.2   Urine analysis:    Component Value Date/Time   COLORURINE YELLOW 11/22/2024 1033   APPEARANCEUR CLEAR 11/22/2024 1033   LABSPEC 1.027 11/22/2024 1033   PHURINE 6.0 11/22/2024 1033   GLUCOSEU NEGATIVE 11/22/2024 1033   HGBUR NEGATIVE 11/22/2024 1033   BILIRUBINUR NEGATIVE 11/22/2024 1033   KETONESUR NEGATIVE 11/22/2024 1033   PROTEINUR >=300 (A) 11/22/2024 1033   NITRITE NEGATIVE 11/22/2024 1033   LEUKOCYTESUR NEGATIVE 11/22/2024 1033   Sepsis Labs: @LABRCNTIP (procalcitonin:4,lacticidven:4) )No results found for this or any previous visit (from the past 240 hours).   Scheduled Meds:  aspirin   81 mg Oral Daily   atorvastatin   10 mg Oral Daily   cholecalciferol   1,000 Units Oral Daily   clopidogrel   75 mg Oral Daily   enoxaparin  (LOVENOX ) injection  60 mg Subcutaneous Q24H   nicotine   21 mg Transdermal Q48H   Continuous Infusions:  Procedures/Studies: ECHOCARDIOGRAM COMPLETE Result Date: 11/23/2024    ECHOCARDIOGRAM REPORT   Patient  Name:   Jorge Jefferson Date of Exam: 11/23/2024 Medical Rec #:  [id number]         Height:       81.0 in Accession #:    [id number]        Weight:       284.4 lb Date of Birth:  1946/03/17        BSA:          2.694 m Patient Age:    86 years          BP:           178/84 mmHg Patient Gender: M                 HR:           79 bpm. Exam Location:  [facility] Procedure: 2D Echo, Cardiac Doppler, Color Doppler and Strain Analysis (Both  Spectral and Color Flow Doppler were utilized during procedure). Indications:    Stroke l63.9  History:        Patient has no prior history of Echocardiogram examinations.                 Risk Factors:Hypertension, Diabetes, Dyslipidemia and CKD.  Sonographer:    Aida Pizza RCS Referring Phys: [phone] Jaliyah Fotheringham  Sonographer Comments: Image acquisition challenging due to respiratory motion and patient moving. Global longitudinal strain was attempted. IMPRESSIONS  1. Left ventricular ejection fraction, by estimation, is 60 to 65%. The left ventricle has normal function. The left ventricle has no regional wall motion abnormalities. There is severe left ventricular hypertrophy of the septal segment. Left ventricular diastolic parameters are consistent with Grade I diastolic dysfunction (impaired relaxation).  2. Right ventricular systolic function is normal. The right ventricular size is normal. Tricuspid regurgitation signal is inadequate for assessing PA pressure.  3. The mitral valve is normal in structure. Trivial mitral valve regurgitation. No evidence of mitral stenosis.  4. The aortic valve is tricuspid. Aortic valve regurgitation is not visualized. No aortic stenosis is present.  5. The inferior vena cava is normal in size with greater than 50% respiratory variability, suggesting right atrial pressure of 3 mmHg. Comparison(s): No prior Echocardiogram. FINDINGS  Left Ventricle: Left ventricular ejection fraction, by estimation, is 60 to 65%. The left ventricle has normal  function. The left ventricle has no regional wall motion abnormalities. Strain was performed and the global longitudinal strain is indeterminate. The left ventricular internal cavity size was normal in size. There is severe left ventricular hypertrophy of the septal segment. Left ventricular diastolic parameters are consistent with Grade I diastolic dysfunction (impaired relaxation). Normal left ventricular filling pressure. Right Ventricle: The right ventricular size is normal. No increase in right ventricular wall thickness. Right ventricular systolic function is normal. Tricuspid regurgitation signal is inadequate for assessing PA pressure. Left Atrium: Left atrial size was normal in size. Right Atrium: Right atrial size was normal in size. Pericardium: There is no evidence of pericardial effusion. Mitral Valve: The mitral valve is normal in structure. Trivial mitral valve regurgitation. No evidence of mitral valve stenosis. Tricuspid Valve: The tricuspid valve is normal in structure. Tricuspid valve regurgitation is not demonstrated. No evidence of tricuspid stenosis. Aortic Valve: The aortic valve is tricuspid. Aortic valve regurgitation is not visualized. No aortic stenosis is present. Pulmonic Valve: The pulmonic valve was grossly normal. Pulmonic valve regurgitation is trivial. No evidence of pulmonic stenosis. Aorta: The aortic root is normal in size and structure. Venous: The inferior vena cava is normal in size with greater than 50% respiratory variability, suggesting right atrial pressure of 3 mmHg. IAS/Shunts: The atrial septum is grossly normal. Additional Comments: 3D was performed not requiring image post processing on an independent workstation and was indeterminate.  LEFT VENTRICLE PLAX 2D LVIDd:         4.60 cm      Diastology LVIDs:         3.20 cm      LV e' medial:    5.10 cm/s LV PW:         1.30 cm      LV E/e' medial:  11.5 LV IVS:        1.70 cm      LV e' lateral:   6.59 cm/s LVOT diam:      2.00 cm      LV E/e' lateral: 8.9 LV SV:  65 LV SV Index:   24 LVOT Area:     3.14 cm  LV Volumes (MOD) LV vol d, MOD A2C: 133.0 ml LV vol d, MOD A4C: 98.3 ml LV vol s, MOD A2C: 41.0 ml LV vol s, MOD A4C: 41.5 ml LV SV MOD A2C:     92.0 ml LV SV MOD A4C:     98.3 ml LV SV MOD BP:      79.6 ml RIGHT VENTRICLE RV S prime:     16.00 cm/s TAPSE (M-mode): 2.3 cm LEFT ATRIUM             Index        RIGHT ATRIUM           Index LA diam:        2.90 cm 1.08 cm/m   RA Area:     16.30 cm LA Vol (A2C):   62.3 ml 23.12 ml/m  RA Volume:   37.60 ml  13.95 ml/m LA Vol (A4C):   65.6 ml 24.35 ml/m LA Biplane Vol: 65.8 ml 24.42 ml/m  AORTIC VALVE LVOT Vmax:   132.00 cm/s LVOT Vmean:  69.100 cm/s LVOT VTI:    0.208 m  AORTA Ao Root diam: 3.30 cm MITRAL VALVE MV Area (PHT): 2.20 cm    SHUNTS MV Decel Time: 345 msec    Systemic VTI:  0.21 m MV E velocity: 58.50 cm/s  Systemic Diam: 2.00 cm MV A velocity: 82.00 cm/s MV E/A ratio:  0.71 Vishnu Priya Mallipeddi Electronically signed by Diannah Late Mallipeddi Signature Date/Time: 11/23/2024/4:49:09 PM    Final    MR BRAIN WO CONTRAST Result Date: 11/22/2024 EXAM: MRI BRAIN WITHOUT CONTRAST 11/22/2024 04:47:13 PM TECHNIQUE: Multiplanar multisequence MRI of the head/brain was performed without the administration of intravenous contrast. COMPARISON: Comparison same day CT head and CTA head and neck. CLINICAL HISTORY: Neuro deficit, acute, stroke suspected. Acute neurological deficit; stroke suspected. FINDINGS: BRAIN AND VENTRICLES: 11 mm focus of diffusion signal abnormality in the lateral aspect of the left thalamus demonstrating restricted diffusion concerning for acute infarct. There is an adjacent focus of remote infarct more medially within the left thalamus. T2 and FLAIR hyperintensity in the periventricular and subcortical white matter suggestive of mild chronic microvascular ischemic changes. There is mild age related parenchymal volume loss. No intracranial  hemorrhage. No mass. No midline shift. No hydrocephalus. The sella is unremarkable. Normal flow voids. ORBITS: Bilateral lens replacement. SINUSES AND MASTOIDS: Bilateral mastoid effusions right greater than left. BONES AND SOFT TISSUES: Normal marrow signal. Degenerative changes in the upper cervical spine with prominent degenerative endplate changes at the C3-C4 level. No soft tissue abnormality. IMPRESSION: 1. 11 mm acute infarct in the lateral aspect of the left thalamus. Electronically signed by: Donnice Mania MD 11/22/2024 06:19 PM EST RP Workstation: [id number]   DG Chest Port 1 View Result Date: 11/22/2024 CLINICAL DATA:  Shortness of breath EXAM: PORTABLE CHEST 1 VIEW COMPARISON:  April 23, 2009 FINDINGS: The heart size and mediastinal contours are within normal limits. No acute pulmonary abnormality is noted. The visualized skeletal structures are unremarkable. IMPRESSION: No active disease. Electronically Signed   By: Lynwood Landy Raddle M.D.   On: 11/22/2024 10:58   CT ANGIO HEAD NECK W WO CM W PERF (CODE STROKE) Result Date: 11/22/2024 EXAM: CTA Head and Neck with Perfusion 11/22/2024 10:03:56 AM TECHNIQUE: CTA of the head and neck was performed without then with the administration of 100 mL iohexol  (OMNIPAQUE ) 350 MG/ML injection. 3D  postprocessing with multiplanar reconstructions and MIPs was performed to evaluate the vascular anatomy. Cerebral perfusion analysis using computed tomography with contrast administration, including post-processing of parametric maps with determination of cerebral blood flow, cerebral blood volume, mean transit time and time-to-maximum. Automated exposure control, iterative reconstruction, and/or weight based adjustment of the mA/kV was utilized to reduce the radiation dose to as low as reasonably achievable. COMPARISON: None available CLINICAL HISTORY: Neuro deficit, acute, stroke suspected. Acute neurologic deficit; stroke suspected. FINDINGS: CTA NECK: AORTIC ARCH AND ARCH  VESSELS: No dissection or arterial injury. No significant stenosis of the brachiocephalic or subclavian arteries. CERVICAL CAROTID ARTERIES: There is mild stenosis of the proximal left cervical ICA in the setting of calcified and noncalcified plaque. The right cervical ICA demonstrates no dissection, arterial injury, or hemodynamically significant stenosis by NASCET criteria. CERVICAL VERTEBRAL ARTERIES: The right cervical vertebral artery is hypoplastic along its course and appears to terminate primarily as the right posterior inferior cerebellar artery. The left cervical vertebral artery demonstrates no dissection, arterial injury, or significant stenosis. LUNGS AND MEDIASTINUM: There is bilateral paraseptal pulmonary emphysema. SOFT TISSUES: No acute abnormality. BONES: No acute abnormality. CTA HEAD: ANTERIOR CIRCULATION: There is mild stenosis of the proximal right petrous ICA. The left intracranial ICA demonstrates no significant stenosis. Hypoplastic versus absent right A1 segment. The left A1 and A2 segments demonstrate no significant stenosis. The middle cerebral arteries demonstrate no significant stenosis. No aneurysm. POSTERIOR CIRCULATION: There is a right posterior communicating artery. The left posterior cerebral artery demonstrates no significant stenosis. The basilar artery demonstrates no significant stenosis. The distal right intracranial vertebral artery is significantly hypoplastic versus stenotic. The left intracranial vertebral artery demonstrates no significant stenosis. No aneurysm. OTHER: No dural venous sinus thrombosis on this non-dedicated study. CT PERFUSION: EXAM QUALITY: Exam quality is adequate with diagnostic perfusion maps. No significant motion artifact. Appropriate arterial inflow and venous outflow curves. CORE INFARCT (CBF<30% volume): 0 mL TOTAL HYPOPERFUSION (Tmax>6s volume): 0 mL PENUMBRA: Mismatch volume: 0 mL Mismatch ratio: not applicable Location: not applicable  IMPRESSION: 1. No acute large vessel occlusion, hemodynamically significant stenosis, or CT perfusion evidence of ischemia. 2. These results were communicated to Dr. Arora at 10:14 AM on 11/22/2024 by text page via the [facility] messaging system. 3. Mild stenosis of the proximal right petrous ICA and proximal left cervical ICA in the setting of calcified and noncalcified plaque. 4. Hypoplastic right cervical vertebral artery terminating primarily as the right PICA, with significantly hypoplastic versus stenotic distal right intracranial vertebral artery. 5. Pulmonary emphysema is an independent risk factor for Jefferson cancer. Recommend consideration for evaluation for a low-dose CT Jefferson cancer screening program. Electronically signed by: Prentice Spade MD 11/22/2024 10:40 AM EST RP Workstation: [id number]   CT HEAD CODE STROKE WO CONTRAST (LKW 0-4.5h, LVO 0-24h) Result Date: 11/22/2024 EXAM: CT HEAD WITHOUT CONTRAST 11/22/2024 09:55:13 AM TECHNIQUE: CT of the head was performed without the administration of intravenous contrast. Automated exposure control, iterative reconstruction, and/or weight based adjustment of the mA/kV was utilized to reduce the radiation dose to as low as reasonably achievable. COMPARISON: CT head 02/01/2011. CLINICAL HISTORY: Neuro deficit, acute, stroke suspected. Acute neurologic deficit; stroke suspected. FINDINGS: BRAIN AND VENTRICLES: No acute intracranial hemorrhage. 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 Age-indeterminate interval small infarction right thalamus. Mild parenchymal volume loss progressed since prior. There is mild scattered white matter hypodensities which are nonspecific but most commonly represent chronic microvascular ischemic changes. No evidence of acute territorial infarct. No hydrocephalus. No extra-axial  collection. No mass effect or midline shift. ORBITS: No acute abnormality. SINUSES: No acute abnormality. SOFT TISSUES AND SKULL: No acute  soft tissue abnormality. No skull fracture. [place] Stroke Program Early CT Score (ASPECTS): 10 IMPRESSION: 1. No acute intracranial hemorrhage or acute territorial infarction. 2. ASPECTS 10. 3. Age-indeterminate small infarction in the right thalamus. MRI would be useful to further assess. 4. These results were communicated to Dr. Voncile at 10:10 on November 22, 2024 by text page via the [facility] messaging system. Electronically signed by: Prentice Spade MD 11/22/2024 10:11 AM EST RP Workstation: [id number]    Alm Schneider, DO  [facility]  If 7PM-7AM, please contact night-coverage [email or web address] Password [email or web address] 11/23/2024, 6:13 PM   LOS: 0 days   "

## 2024-11-23 NOTE — Plan of Care (Signed)
" °  Problem: Acute Rehab OT Goals (only OT should resolve) Goal: Pt. Will Perform Eating Flowsheets (Taken 11/23/2024 1207) Pt Will Perform Eating:  with modified independence  sitting Goal: Pt. Will Perform Grooming Flowsheets (Taken 11/23/2024 1207) Pt Will Perform Grooming:  with set-up  sitting Goal: Pt. Will Perform Upper Body Dressing Flowsheets (Taken 11/23/2024 1207) Pt Will Perform Upper Body Dressing:  with set-up  sitting Goal: Pt. Will Perform Lower Body Dressing Flowsheets (Taken 11/23/2024 1207) Pt Will Perform Lower Body Dressing:  with min assist  with adaptive equipment  sitting/lateral leans  with mod assist Goal: Pt. Will Transfer To Toilet Flowsheets (Taken 11/23/2024 1207) Pt Will Transfer to Toilet:  with min assist  stand pivot transfer Goal: Pt. Will Perform Toileting-Clothing Manipulation Flowsheets (Taken 11/23/2024 1207) Pt Will Perform Toileting - Clothing Manipulation and hygiene:  with min assist  sitting/lateral leans Goal: Pt/Caregiver Will Perform Home Exercise Program Flowsheets (Taken 11/23/2024 1207) Pt/caregiver will Perform Home Exercise Program:  Increased strength and coordination  Increased ROM  Right Upper extremity  With minimal assist  Berle Fitz OT, MOT  "

## 2024-11-23 NOTE — Evaluation (Signed)
 Physical Therapy Evaluation Patient Details Name: Jorge Jefferson MRN: [id number] DOB: 21-Feb-1946 Today's Date: 11/23/2024  History of Present Illness  Jorge Jefferson is a 79 year old male with a history of diabetes mellitus type 2, hypertension, hyperlipidemia, hepatic fibrosis, tobacco abuse presenting with dysarthria and right arm and right leg weakness when he woke up at 8 AM on 11/22/2024.  The patient went to bed in his usual state of health around 12:30 AM on 11/22/2024.  Because of his deficits, EMS was activated and code stroke was called.  The patient was seen by neurology in the emergency department and he was deemed not to be a tPA candidate due to being outside the window of time.  Patient denies fevers, chills, headache, chest pain, dyspnea, nausea, vomiting, diarrhea, abdominal pain, dysuria, hematuria, hematochezia, and melena.   Clinical Impression  Patient demonstrates signs and symptoms of stroke, decreased LE strength, abnormal flaccidity of RUE and RLE, and impaired functional mobility. Patient also demonstrates inability to ambulate during today's session with R sided deficits. Patients symptoms likely arising from neurological involvement. Patient also demonstrates need for max assist for bed mobility, functional transfers and attempting ambulation. Patient requires education on PT recommendations, POC, and importance of consistent mobility. Patient would benefit from skilled acute physical therapy for increased independence with ambulation, increased LE strength, and balance for improved functional mobility, return to higher level of function with ADLs, and progress towards therapy goals.         If plan is discharge home, recommend the following: Two people to help with walking and/or transfers;Two people to help with bathing/dressing/bathroom;Assistance with cooking/housework;Assistance with feeding;Direct supervision/assist for medications management;Direct supervision/assist  for financial management;Assist for transportation;Help with stairs or ramp for entrance   Can travel by private vehicle        Equipment Recommendations None recommended by PT  Recommendations for Other Services       Functional Status Assessment Patient has had a recent decline in their functional status and demonstrates the ability to make significant improvements in function in a reasonable and predictable amount of time.     Precautions / Restrictions Precautions Precautions: Fall Recall of Precautions/Restrictions: Intact Restrictions Weight Bearing Restrictions Per Provider Order: No      Mobility  Bed Mobility Overal bed mobility: Needs Assistance Bed Mobility: Supine to Sit, Sit to Supine     Supine to sit: Max assist Sit to supine: Max assist   General bed mobility comments: R lateral lean; much assist for trunk control.    Transfers Overall transfer level: Needs assistance Equipment used: Rolling walker (2 wheels) Transfers: Sit to/from Stand Sit to Stand: Max assist, +2 physical assistance           General transfer comment: PT and OT assist with RW used only in L UE for sit to stand from EOB. R LE buckling if not locked in place by PT.    Ambulation/Gait Ambulation/Gait assistance:  (although attemtpted pts deficits on RLE would not allow for ambulation this date, R knee consistently buckling, therapist max assist for safety) Gait Distance (Feet): 0 Feet              Stairs            Wheelchair Mobility     Tilt Bed    Modified Rankin (Stroke Patients Only)       Balance Overall balance assessment: Needs assistance Sitting-balance support: Feet supported, Single extremity supported Sitting balance-Leahy Scale: Poor Sitting balance - Comments:  seated at EOB Postural control: Right lateral lean Standing balance support: During functional activity, Reliant on assistive device for balance, Bilateral upper extremity  supported Standing balance-Leahy Scale: Zero Standing balance comment: using RW only with L UE and therapist assist on other side                             Pertinent Vitals/Pain Pain Assessment Pain Assessment: No/denies pain    Home Living Family/patient expects to be discharged to:: Private residence Living Arrangements: Spouse/significant other Available Help at Discharge: Family;Available 24 hours/day Type of Home: Apartment Home Access: Level entry       Home Layout: One level Home Equipment: Cane - single point      Prior Function Prior Level of Function : Independent/Modified Independent             Mobility Comments: Community ambulator without AD. ADLs Comments: Independent     Extremity/Trunk Assessment   Upper Extremity Assessment Upper Extremity Assessment: Defer to OT evaluation RUE Deficits / Details: 2+/5 gross grasp; only trace movement of wrist flexion and extension. Faccid for elbow and shoulder movement. RUE Sensation: WNL RUE Coordination: decreased fine motor;decreased gross motor    Lower Extremity Assessment Lower Extremity Assessment: Generalized weakness;RLE deficits/detail RLE Deficits / Details: minimal muscle activation on RLE compared to LLE, requires max assist for hip, knee, and ankle mobility RLE Sensation: WNL (pt reports sensation is the same on both sides) RLE Coordination: decreased fine motor;decreased gross motor (Pt unable to move RLE independently at this time)    Cervical / Trunk Assessment Cervical / Trunk Assessment: Normal  Communication   Communication Communication: Impaired Factors Affecting Communication: Reduced clarity of speech    Cognition Arousal: Alert Behavior During Therapy: WFL for tasks assessed/performed                             Following commands: Intact       Cueing Cueing Techniques: Verbal cues, Tactile cues     General Comments      Exercises      Assessment/Plan    PT Assessment Patient needs continued PT services  PT Problem List Decreased strength;Decreased activity tolerance;Decreased balance;Decreased mobility;Decreased coordination       PT Treatment Interventions DME instruction;Gait training;Stair training;Functional mobility training;Therapeutic activities;Therapeutic exercise;Neuromuscular re-education;Balance training;Patient/family education;Wheelchair mobility training    PT Goals (Current goals can be found in the Care Plan section)  Acute Rehab PT Goals Patient Stated Goal: to regain strength before returning home PT Goal Formulation: With patient/family Time For Goal Achievement: 12/21/24 Potential to Achieve Goals: Fair    Frequency Min 5X/week     Co-evaluation PT/OT/SLP Co-Evaluation/Treatment: Yes Reason for Co-Treatment: To address functional/ADL transfers PT goals addressed during session: Mobility/safety with mobility;Balance;Strengthening/ROM OT goals addressed during session: ADL's and self-care       AM-PAC PT 6 Clicks Mobility  Outcome Measure Help needed turning from your back to your side while in a flat bed without using bedrails?: A Lot Help needed moving from lying on your back to sitting on the side of a flat bed without using bedrails?: A Lot Help needed moving to and from a bed to a chair (including a wheelchair)?: A Lot Help needed standing up from a chair using your arms (e.g., wheelchair or bedside chair)?: A Lot Help needed to walk in hospital room?: A Lot Help needed climbing 3-5 steps  with a railing? : Total 6 Click Score: 11    End of Session Equipment Utilized During Treatment: Gait belt Activity Tolerance: Patient tolerated treatment well Patient left: in bed;with bed alarm set;with family/visitor present Nurse Communication: Mobility status;Precautions PT Visit Diagnosis: Unsteadiness on feet (R26.81);Other abnormalities of gait and mobility (R26.89);Muscle weakness  (generalized) (M62.81);Difficulty in walking, not elsewhere classified (R26.2);Hemiplegia and hemiparesis Hemiplegia - Right/Left: Right    Time: 9153-9079 PT Time Calculation (min) (ACUTE ONLY): 34 min   Charges:   PT Evaluation $PT Eval Low Complexity: 1 Low   PT General Charges $$ ACUTE PT VISIT: 1 Visit         Lang Ada, PT, DPT [facility] Office: [phone] 2:43 PM, 11/23/24

## 2024-11-23 NOTE — Progress Notes (Signed)
 P.T. in with patient doing an eval.                                     Aida Pizza, RCS

## 2024-11-23 NOTE — Progress Notes (Signed)
*  PRELIMINARY RESULTS* Echocardiogram 2D Echocardiogram has been performed.  Jorge Jefferson 11/23/2024, 3:15 PM

## 2024-11-23 NOTE — TOC CM/SW Note (Signed)
 Transition of Care [facility]) - Inpatient Brief Assessment   Patient Details  Name: Jorge Jefferson MRN: [id number] Date of Birth: December 13, 1945  Transition of Care [facility]) CM/SW Contact:    Lucie Lunger, LCSWA Phone Number: 11/23/2024, 11:23 AM   Clinical Narrative: CSW notes that PT/OT is recommending CIR for pt at this time.   Transition of Care Department [facility]) has reviewed patient and no TOC needs have been identified at this time. We will continue to monitor patient advancement through interdiciplinary progression rounds. If new patient transition needs arise, please place a TOC consult.  Transition of Care Asessment: Insurance and Status: Insurance coverage has been reviewed Patient has primary care physician: Yes Home environment has been reviewed: From home Prior level of function:: Independent Prior/Current Home Services: No current home services Social Drivers of Health Review: SDOH reviewed no interventions necessary Readmission risk has been reviewed: Yes Transition of care needs: no transition of care needs at this time

## 2024-11-23 NOTE — Evaluation (Signed)
 Occupational Therapy Evaluation Patient Details Name: Jorge Jefferson MRN: [id number] DOB: 1946-06-09 Today's Date: 11/23/2024   History of Present Illness   Jorge Jefferson is a 79 year old male with a history of diabetes mellitus type 2, hypertension, hyperlipidemia, hepatic fibrosis, tobacco abuse presenting with dysarthria and right arm and right leg weakness when he woke up at 8 AM on 11/22/2024.  The patient went to bed in his usual state of health around 12:30 AM on 11/22/2024.  Because of his deficits, EMS was activated and code stroke was called.  The patient was seen by neurology in the emergency department and he was deemed not to be a tPA candidate due to being outside the window of time.  Patient denies fevers, chills, headache, chest pain, dyspnea, nausea, vomiting, diarrhea, abdominal pain, dysuria, hematuria, hematochezia, and melena. (per MD)     Clinical Impressions Pt agreeable to OT and PT co-evaluation. Pt is independent at baseline with wife able to be home 24/7. Today pt required max A for bed mobility and max to total +2 assist for one rep of sit to stand from EOB. Pt demonstrates need for mod A for upper body ADL tasks and max to total for lower body tasks. Pt's R UE is mostly flaccid with slight grasp strength and trace wrist movement. Pt is also slurring his words, which is new for the patient. The pt's medical and functional status supports admission to an inpatient rehabilitation program due to multifactorial deficits, continued progress will require a coordinated, team-based approach with physician oversight to achieve meaningful gains in mobility, ADL independence, and safety. Pt left in the bed with family present. Pt will benefit from continued OT in the hospital to increase strength, balance, and endurance for safe ADL's.         If plan is discharge home, recommend the following:   Two people to help with walking and/or transfers;A lot of help with  bathing/dressing/bathroom;Two people to help with bathing/dressing/bathroom;Assistance with cooking/housework;Assistance with feeding;Assist for transportation;Help with stairs or ramp for entrance     Functional Status Assessment   Patient has had a recent decline in their functional status and demonstrates the ability to make significant improvements in function in a reasonable and predictable amount of time.     Equipment Recommendations   None recommended by OT     Recommendations for Other Services   Rehab consult     Precautions/Restrictions   Precautions Precautions: Fall Recall of Precautions/Restrictions: Intact Restrictions Weight Bearing Restrictions Per Provider Order: No     Mobility Bed Mobility Overal bed mobility: Needs Assistance Bed Mobility: Supine to Sit, Sit to Supine     Supine to sit: Max assist Sit to supine: Max assist   General bed mobility comments: R lateral lean; much assist for trunk control.    Transfers Overall transfer level: Needs assistance Equipment used: Rolling walker (2 wheels) Transfers: Sit to/from Stand Sit to Stand: Max assist, +2 physical assistance           General transfer comment: PT and OT assist with RW used only in L UE for sit to stand from EOB. R LE buckling if not locked in place by PT.      Balance Overall balance assessment: Needs assistance Sitting-balance support: Feet supported, Single extremity supported Sitting balance-Leahy Scale: Poor Sitting balance - Comments: seated at EOB Postural control: Right lateral lean Standing balance support: Single extremity supported, During functional activity, Reliant on assistive device for balance Standing balance-Leahy Scale:  Poor Standing balance comment: using RW only with L UE.                           ADL either performed or assessed with clinical judgement   ADL Overall ADL's : Needs assistance/impaired Eating/Feeding: Set  up;Sitting   Grooming: Moderate assistance;Sitting;Minimal assistance   Upper Body Bathing: Moderate assistance;Sitting   Lower Body Bathing: Maximal assistance;Total assistance;Bed level;Sitting/lateral leans   Upper Body Dressing : Moderate assistance;Sitting   Lower Body Dressing: Maximal assistance;Total assistance;Bed level;Sitting/lateral leans   Toilet Transfer: Maximal assistance;+2 for physical assistance;Rolling walker (2 wheels);Total assistance Toilet Transfer Details (indicate cue type and reason): Partially simulated via sit to stand from EOB. Toileting- Clothing Manipulation and Hygiene: Moderate assistance;Maximal assistance;Bed level;Sitting/lateral lean   Tub/ Shower Transfer: Maximal assistance;Total assistance;+2 for physical assistance;Rolling walker (2 wheels);Tub bench   Functional mobility during ADLs: Maximal assistance;Total assistance;+2 for physical assistance;Rolling walker (2 wheels)       Vision Baseline Vision/History: 1 Wears glasses Ability to See in Adequate Light: 1 Impaired Patient Visual Report: No change from baseline Vision Assessment?: No apparent visual deficits     Perception Perception: Not tested       Praxis Praxis: Not tested       Pertinent Vitals/Pain Pain Assessment Pain Assessment: No/denies pain     Extremity/Trunk Assessment Upper Extremity Assessment Upper Extremity Assessment: Right hand dominant;RUE deficits/detail RUE Deficits / Details: 2+/5 gross grasp; only trace movement of wrist flexion and extension. Faccid for elbow and shoulder movement. RUE Sensation: WNL RUE Coordination: decreased fine motor;decreased gross motor   Lower Extremity Assessment Lower Extremity Assessment: Defer to PT evaluation   Cervical / Trunk Assessment Cervical / Trunk Assessment: Normal   Communication Communication Communication: Impaired Factors Affecting Communication: Reduced clarity of speech   Cognition Arousal:  Alert Behavior During Therapy: WFL for tasks assessed/performed Cognition: No apparent impairments                               Following commands: Intact       Cueing  General Comments   Cueing Techniques: Verbal cues;Tactile cues                 Home Living Family/patient expects to be discharged to:: Private residence Living Arrangements: Spouse/significant other Available Help at Discharge: Family;Available 24 hours/day Type of Home: Apartment Home Access: Level entry     Home Layout: One level     Bathroom Shower/Tub: Chief Strategy Officer: Standard Bathroom Accessibility:  (maybe RW)   Home Equipment: Cane - single point      Lives With: Spouse    Prior Functioning/Environment Prior Level of Function : Independent/Modified Independent             Mobility Comments: Tourist information centre manager without AD. ADLs Comments: Independent    OT Problem List: Decreased strength;Decreased range of motion;Decreased activity tolerance;Impaired balance (sitting and/or standing);Decreased coordination;Impaired UE functional use   OT Treatment/Interventions: Self-care/ADL training;Therapeutic exercise;Neuromuscular education;DME and/or AE instruction;Therapeutic activities;Patient/family education;Balance training      OT Goals(Current goals can be found in the care plan section)   Acute Rehab OT Goals Patient Stated Goal: Improve function. OT Goal Formulation: With patient/family Time For Goal Achievement: 12/07/24 Potential to Achieve Goals: Fair   OT Frequency:  Min 3X/week    Co-evaluation PT/OT/SLP Co-Evaluation/Treatment: Yes Reason for Co-Treatment: To address functional/ADL transfers  OT goals addressed during session: ADL's and self-care      AM-PAC OT 6 Clicks Daily Activity     Outcome Measure Help from another person eating meals?: A Little Help from another person taking care of personal grooming?: A Lot Help  from another person toileting, which includes using toliet, bedpan, or urinal?: A Lot Help from another person bathing (including washing, rinsing, drying)?: A Lot Help from another person to put on and taking off regular upper body clothing?: A Lot Help from another person to put on and taking off regular lower body clothing?: A Lot 6 Click Score: 13   End of Session Equipment Utilized During Treatment: Rolling walker (2 wheels);Gait belt  Activity Tolerance: Patient tolerated treatment well Patient left: in bed;with call bell/phone within reach;with family/visitor present  OT Visit Diagnosis: Unsteadiness on feet (R26.81);Other abnormalities of gait and mobility (R26.89);Muscle weakness (generalized) (M62.81);Other symptoms and signs involving the nervous system (R29.898);Hemiplegia and hemiparesis Hemiplegia - Right/Left: Right Hemiplegia - dominant/non-dominant: Dominant Hemiplegia - caused by: Cerebral infarction                Time: 9167-9080 OT Time Calculation (min): 47 min Charges:  OT General Charges $OT Visit: 1 Visit OT Evaluation $OT Eval Moderate Complexity: 1 Mod  Dejai Schubach OT, MOT  [employer] 11/23/2024, 12:05 PM

## 2024-11-23 NOTE — Progress Notes (Signed)
 I connected with  Jorge Jefferson on 11/23/24 by a video enabled telemedicine application and verified that I am speaking with the correct person using two identifiers.   I discussed the limitations of evaluation and management by telemedicine. The patient expressed understanding and agreed to proceed.   Location of patient: [facility] Patient physician: [facility]   Subjective: No acute events overnight.  No new concerns.  Wife at bedside.  Reports taking Plavix  at home.  Also reports smoking nicotine  as well as marijuana  ROS: negative except above  Examination  Vital signs in last 24 hours: Temp:  [98 F (36.7 C)-98.9 F (37.2 C)] 98.9 F (37.2 C) (02/05 0900) Pulse Rate:  [65-87] 79 (02/05 0900) Resp:  [16-19] 18 (02/05 0900) BP: (171-192)/(60-85) 178/84 (02/05 0900) SpO2:  [93 %-99 %] 97 % (02/05 0900)  General: lying in bed, NAD CVS: pulse-normal rate and rhythm RS: breathing comfortably Extremities: normal   Neuro: MS: Alert, oriented, follows commands, no aphasia, moderate dysarthria CN: pupils equal and reactive,  EOMI, right facial droop, tongue midline, decreased sensation on right side of face Motor: Antigravity strength without drift in left upper and left lower extremity, no movement in right upper and right lower extremity Coordination: FTN intact on left, unable to perform on right Gait: not tested  Basic Metabolic Panel: Recent Labs  Lab 11/22/24 0940  NA 141  K 4.3  CL 109  CO2 22  GLUCOSE 131*  BUN 17  CREATININE 1.48*  CALCIUM  9.5    CBC: Recent Labs  Lab 11/22/24 0940  WBC 6.1  NEUTROABS 4.3  HGB 13.1  HCT 39.6  MCV 93.6  PLT 167     Coagulation Studies: Recent Labs    11/22/24 0940  LABPROT 13.3  INR 1.0    Imaging personally reviewed  CT head without contrast 11/22/2024: No acute intracranial hemorrhage or acute territorial infarction. ASPECTS 10. Age-indeterminate small infarction in the right  thalamus. MRI would be useful to further assess.  CT head and neck with and without contrast 11/22/2024: 1. No acute large vessel occlusion, hemodynamically significant stenosis, or CT perfusion evidence of ischemia. Mild stenosis of the proximal right petrous ICA and proximal left cervical ICA in the setting of calcified and noncalcified plaque. Hypoplastic right cervical vertebral artery terminating primarily as the right PICA, with significantly hypoplastic versus stenotic distal right intracranial vertebral artery.  MRI brain without contrast 11/22/2024:11 mm acute infarct in the lateral aspect of the left thalamus.      ASSESSMENT AND PLAN: 79 year old male who presented with right-sided weakness.  MRI brain showed left thalamic stroke.   Acute ischemic stroke Hypertension Nicotine  use disorder Cannabis use disorder ( unable to determine severity) -Etiology: Likely small vessel disease versus embolic - Recommend aspirin  81 mg daily and Plavix  75 mg daily for 3 months followed by aspirin  81 mg daily  - LDL 47, can continue atorvastatin  10 mg daily -TTE ordered and pending.  If negative recommend 30-day cardiac monitor to look for paroxysmal A-fib - PT/OT/speech therapy - Goal blood pressure: Gradual normotension - Smoking cessation counseling - Marijuana cessation counseling - Follow-up with neurology in 4 to 6 weeks (order placed) - Discussed plan with patient and wife at bedside - Discussed plan with Dr. Evonnie via secure chat    I personally spent a total of 36 minutes in the care of the patient today including getting/reviewing separately obtained history, performing a medically appropriate exam/evaluation, counseling and educating, placing  orders, referring and communicating with other health care professionals, documenting clinical information in the EHR, independently interpreting results, and coordinating care.           Arlin Krebs Epilepsy [facility] For  questions after 5pm please refer to AMION to reach the Neurologist on call

## 2024-11-23 NOTE — Plan of Care (Signed)

## 2024-11-24 LAB — BASIC METABOLIC PANEL WITH GFR
Anion gap: 11 (ref 5–15)
BUN: 18 mg/dL (ref 8–23)
CO2: 23 mmol/L (ref 22–32)
Calcium: 9.2 mg/dL (ref 8.9–10.3)
Chloride: 109 mmol/L (ref 98–111)
Creatinine, Ser: 1.41 mg/dL — ABNORMAL HIGH (ref 0.61–1.24)
GFR, Estimated: 51 mL/min — ABNORMAL LOW
Glucose, Bld: 93 mg/dL (ref 70–99)
Potassium: 4.1 mmol/L (ref 3.5–5.1)
Sodium: 144 mmol/L (ref 135–145)

## 2024-11-24 LAB — MAGNESIUM: Magnesium: 2.3 mg/dL (ref 1.7–2.4)

## 2024-11-24 MED ORDER — AMLODIPINE BESYLATE 5 MG PO TABS
5.0000 mg | ORAL_TABLET | Freq: Every day | ORAL | Status: AC
  Administered 2024-11-24: 5 mg via ORAL
  Filled 2024-11-24: qty 1

## 2024-11-24 NOTE — Progress Notes (Signed)
 "          PROGRESS NOTE  Jorge Jefferson FMW:[id number] DOB: 03-28-1946 DOA: 11/22/2024 PCP: Jorge Lung, MD  Brief History:  79 year old male with a history of diabetes mellitus type 2, hypertension, hyperlipidemia, hepatic fibrosis, tobacco abuse presenting with dysarthria and right arm and right leg weakness when he woke up at 8 AM on 11/22/2024.  The patient went to bed in his usual state of health around 12:30 AM on 11/22/2024.  Because of his deficits, EMS was activated and code stroke was called.  The patient was seen by neurology in the emergency department and he was deemed not to be a tPA candidate due to being outside the window of time.  Patient denies fevers, chills, headache, chest pain, dyspnea, nausea, vomiting, diarrhea, abdominal pain, dysuria, hematuria, hematochezia, and melena.  The patient states that his deficits have been relatively stable since he woke up in the morning up 11/22/24. Notably, he smokes about 1 pack/day for at least 35 years.  He denies any alcohol or illicit drug use.  In the ED, the patient was afebrile hemodynamically stable with oxygen saturation 100% room air.  WBC 6.1, hemoglobin 13.1, platelet 167.  Sodium 141, potassium 4.3, bicarbonate 22, serum creatinine 1.48.  LFTs are unremarkable.  Ammonia 32.  Alcohol level negative.  Chest x-ray is negative.  CT brain showed age-indeterminate right thalamic infarct.  CTA head and neck was negative for LVO.  There was negative for hemodynamically significant stenosis.  Patient was admitted for further evaluation treatment of his stroke.   Assessment/Plan: Acute ischemic stroke -Appreciate Neurology Consult -PT/OT evaluation>>CIR -Speech therapy eval -CT brain--age indeterminant R-thalamic infarct -MRI brain--15mm acute left thalamic infarct -CTA H&N--negative LVO, negative hemodynamic significant stenosis, hypoplastic right cervical VA.  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 Shelton   Peripheral vascular disease - On Plavix  PTA - Status post left external iliac stent 03/25/2023 - Follow-up vascular surgery   CKD 3a - Baseline creatinine 1.2-1.4 - stable   Tobacco abuse - cessation discussed   Essential hypertension - Holding amlodipine  and clonidine to allow for permissive hypertension - hydralazine  for SBP >220   Mixed hyperlipidemia - Continue statin - Obtain lipid panel--LDL 47   Hyperglycemia - Check A1c 5.2   Obesity - BMI 30.48 - Lifestyle modification               Family Communication:   daughter at bedside 2/6   Consultants:  neurology   Code Status:  FULL    DVT Prophylaxis:   Jorge Jefferson Lovenox      Procedures: As Listed in Progress Note Above   Antibiotics: None            Subjective: He is speaking clearer today.  Right arm and right leg weakness are unchanged.  Patient denies fevers, chills, headache, chest pain, dyspnea, nausea, vomiting, diarrhea, abdominal pain, dysuria, hematuria, hematochezia, and melena.   Objective: Vitals:   11/23/24 2030 11/24/24 0632 11/24/24 0845 11/24/24 1420  BP: (!) 208/97 (!) 176/83 (!) 178/92 (!) 182/86  Pulse: 81   85  Resp: 20 18 18 18   Temp: 98.6 F (37 C) 99.1 F (37.3 C)  98.5 F (36.9 C)  TempSrc: Oral Oral Oral Oral  SpO2: 97% 95% 97% 93%  Weight:  Height:        Intake/Output Summary (Last 24 hours) at 11/24/2024 1816 Last data filed at 11/24/2024 9347 Gross per 24 hour  Intake --  Output 600 ml  Net -600 ml   Weight change:  Exam:  General:  Pt is alert, follows commands appropriately, not in acute distress HEENT: No icterus, No thrush, No neck mass, [place]/AT Cardiovascular: RRR, S1/S2, no rubs, no gallops Respiratory: CTA bilaterally, no wheezing, no crackles, no rhonchi Abdomen: Soft/+BS, non tender, non  distended, no guarding Extremities: No edema, No lymphangitis, No petechiae, No rashes, no synovitis   Data Reviewed: I have personally reviewed following labs and imaging studies Basic Metabolic Panel: Recent Labs  Lab 11/22/24 0940 11/24/24 0420  NA 141 144  K 4.3 4.1  CL 109 109  CO2 22 23  GLUCOSE 131* 93  BUN 17 18  CREATININE 1.48* 1.41*  CALCIUM  9.5 9.2  MG  --  2.3   Liver Function Tests: Recent Labs  Lab 11/22/24 0940  AST 23  ALT 16  ALKPHOS 110  BILITOT 0.4  PROT 6.2*  ALBUMIN 3.6   No results for input(s): LIPASE, AMYLASE in the last 168 hours. Recent Labs  Lab 11/22/24 1028  AMMONIA 32   Coagulation Profile: Recent Labs  Lab 11/22/24 0940  INR 1.0   CBC: Recent Labs  Lab 11/22/24 0940  WBC 6.1  NEUTROABS 4.3  HGB 13.1  HCT 39.6  MCV 93.6  PLT 167   Cardiac Enzymes: No results for input(s): CKTOTAL, CKMB, CKMBINDEX, TROPONINI in the last 168 hours. BNP: Invalid input(s): POCBNP CBG: Recent Labs  Lab 11/22/24 0940  GLUCAP 138*   HbA1C: Recent Labs    11/22/24 0940  HGBA1C 5.2   Urine analysis:    Component Value Date/Time   COLORURINE YELLOW 11/22/2024 1033   APPEARANCEUR CLEAR 11/22/2024 1033   LABSPEC 1.027 11/22/2024 1033   PHURINE 6.0 11/22/2024 1033   GLUCOSEU NEGATIVE 11/22/2024 1033   HGBUR NEGATIVE 11/22/2024 1033   BILIRUBINUR NEGATIVE 11/22/2024 1033   KETONESUR NEGATIVE 11/22/2024 1033   PROTEINUR >=300 (A) 11/22/2024 1033   NITRITE NEGATIVE 11/22/2024 1033   LEUKOCYTESUR NEGATIVE 11/22/2024 1033   Sepsis Labs: @LABRCNTIP (procalcitonin:4,lacticidven:4) )No results found for this or any previous visit (from the past 240 hours).   Scheduled Meds:  amLODipine   5 mg Oral Daily   aspirin   81 mg Oral Daily   atorvastatin   10 mg Oral Daily   cholecalciferol   1,000 Units Oral Daily   clopidogrel   75 mg Oral Daily   enoxaparin  (LOVENOX ) injection  60 mg Subcutaneous Q24H   nicotine   21 mg  Transdermal Q48H   Continuous Infusions:  Procedures/Studies: ECHOCARDIOGRAM COMPLETE Result Date: 11/23/2024    ECHOCARDIOGRAM REPORT   Patient Name:   Jorge Jefferson Jorge Jefferson Date of Exam: 11/23/2024 Medical Rec #:  [id number]         Height:       81.0 in Accession #:    [id number]        Weight:       284.4 lb Date of Birth:  1946/09/29        BSA:          2.694 m Patient Age:    78 years          BP:           178/84 mmHg Patient Gender: M  HR:           79 bpm. Exam Location:  [facility] Procedure: 2D Echo, Cardiac Doppler, Color Doppler and Strain Analysis (Both            Spectral and Color Flow Doppler were utilized during procedure). Indications:    Stroke l63.9  History:        Patient has no prior history of Echocardiogram examinations.                 Risk Factors:Hypertension, Diabetes, Dyslipidemia and CKD.  Sonographer:    Aida Pizza RCS Referring Phys: [phone] Amitai Delaughter  Sonographer Comments: Image acquisition challenging due to respiratory motion and patient moving. Global longitudinal strain was attempted. IMPRESSIONS  1. Left ventricular ejection fraction, by estimation, is 60 to 65%. The left ventricle has normal function. The left ventricle has no regional wall motion abnormalities. There is severe left ventricular hypertrophy of the septal segment. Left ventricular diastolic parameters are consistent with Grade I diastolic dysfunction (impaired relaxation).  2. Right ventricular systolic function is normal. The right ventricular size is normal. Tricuspid regurgitation signal is inadequate for assessing PA pressure.  3. The mitral valve is normal in structure. Trivial mitral valve regurgitation. No evidence of mitral stenosis.  4. The aortic valve is tricuspid. Aortic valve regurgitation is not visualized. No aortic stenosis is present.  5. The inferior vena cava is normal in size with greater than 50% respiratory variability, suggesting right atrial pressure of 3 mmHg.  Comparison(s): No prior Echocardiogram. FINDINGS  Left Ventricle: Left ventricular ejection fraction, by estimation, is 60 to 65%. The left ventricle has normal function. The left ventricle has no regional wall motion abnormalities. Strain was performed and the global longitudinal strain is indeterminate. The left ventricular internal cavity size was normal in size. There is severe left ventricular hypertrophy of the septal segment. Left ventricular diastolic parameters are consistent with Grade I diastolic dysfunction (impaired relaxation). Normal left ventricular filling pressure. Right Ventricle: The right ventricular size is normal. No increase in right ventricular wall thickness. Right ventricular systolic function is normal. Tricuspid regurgitation signal is inadequate for assessing PA pressure. Left Atrium: Left atrial size was normal in size. Right Atrium: Right atrial size was normal in size. Pericardium: There is no evidence of pericardial effusion. Mitral Valve: The mitral valve is normal in structure. Trivial mitral valve regurgitation. No evidence of mitral valve stenosis. Tricuspid Valve: The tricuspid valve is normal in structure. Tricuspid valve regurgitation is not demonstrated. No evidence of tricuspid stenosis. Aortic Valve: The aortic valve is tricuspid. Aortic valve regurgitation is not visualized. No aortic stenosis is present. Pulmonic Valve: The pulmonic valve was grossly normal. Pulmonic valve regurgitation is trivial. No evidence of pulmonic stenosis. Aorta: The aortic root is normal in size and structure. Venous: The inferior vena cava is normal in size with greater than 50% respiratory variability, suggesting right atrial pressure of 3 mmHg. IAS/Shunts: The atrial septum is grossly normal. Additional Comments: 3D was performed not requiring image post processing on an independent workstation and was indeterminate.  LEFT VENTRICLE PLAX 2D LVIDd:         4.60 cm      Diastology LVIDs:          3.20 cm      LV e' medial:    5.10 cm/s LV PW:         1.30 cm      LV E/e' medial:  11.5 LV IVS:  1.70 cm      LV e' lateral:   6.59 cm/s LVOT diam:     2.00 cm      LV E/e' lateral: 8.9 LV SV:         65 LV SV Index:   24 LVOT Area:     3.14 cm  LV Volumes (MOD) LV vol d, MOD A2C: 133.0 ml LV vol d, MOD A4C: 98.3 ml LV vol s, MOD A2C: 41.0 ml LV vol s, MOD A4C: 41.5 ml LV SV MOD A2C:     92.0 ml LV SV MOD A4C:     98.3 ml LV SV MOD BP:      79.6 ml RIGHT VENTRICLE RV S prime:     16.00 cm/s TAPSE (M-mode): 2.3 cm LEFT ATRIUM             Index        RIGHT ATRIUM           Index LA diam:        2.90 cm 1.08 cm/m   RA Area:     16.30 cm LA Vol (A2C):   62.3 ml 23.12 ml/m  RA Volume:   37.60 ml  13.95 ml/m LA Vol (A4C):   65.6 ml 24.35 ml/m LA Biplane Vol: 65.8 ml 24.42 ml/m  AORTIC VALVE LVOT Vmax:   132.00 cm/s LVOT Vmean:  69.100 cm/s LVOT VTI:    0.208 m  AORTA Ao Root diam: 3.30 cm MITRAL VALVE MV Area (PHT): 2.20 cm    SHUNTS MV Decel Time: 345 msec    Systemic VTI:  0.21 m MV E velocity: 58.50 cm/s  Systemic Diam: 2.00 cm MV A velocity: 82.00 cm/s MV E/A ratio:  0.71 Vishnu Priya Mallipeddi Electronically signed by Diannah Late Mallipeddi Signature Date/Time: 11/23/2024/4:49:09 PM    Final    MR BRAIN WO CONTRAST Result Date: 11/22/2024 EXAM: MRI BRAIN WITHOUT CONTRAST 11/22/2024 04:47:13 PM TECHNIQUE: Multiplanar multisequence MRI of the head/brain was performed without the administration of intravenous contrast. COMPARISON: Comparison same day CT head and CTA head and neck. CLINICAL HISTORY: Neuro deficit, acute, stroke suspected. Acute neurological deficit; stroke suspected. FINDINGS: BRAIN AND VENTRICLES: 11 mm focus of diffusion signal abnormality in the lateral aspect of the left thalamus demonstrating restricted diffusion concerning for acute infarct. There is an adjacent focus of remote infarct more medially within the left thalamus. T2 and FLAIR hyperintensity in the periventricular  and subcortical white matter suggestive of mild chronic microvascular ischemic changes. There is mild age related parenchymal volume loss. No intracranial hemorrhage. No mass. No midline shift. No hydrocephalus. The sella is unremarkable. Normal flow voids. ORBITS: Bilateral lens replacement. SINUSES AND MASTOIDS: Bilateral mastoid effusions right greater than left. BONES AND SOFT TISSUES: Normal marrow signal. Degenerative changes in the upper cervical spine with prominent degenerative endplate changes at the C3-C4 level. No soft tissue abnormality. IMPRESSION: 1. 11 mm acute infarct in the lateral aspect of the left thalamus. Electronically signed by: Donnice Mania MD 11/22/2024 06:19 PM EST RP Workstation: [id number]   DG Chest Port 1 View Result Date: 11/22/2024 CLINICAL DATA:  Shortness of breath EXAM: PORTABLE CHEST 1 VIEW COMPARISON:  April 23, 2009 FINDINGS: The heart size and mediastinal contours are within normal limits. No acute pulmonary abnormality is noted. The visualized skeletal structures are unremarkable. IMPRESSION: No active disease. Electronically Signed   By: Lynwood Landy Raddle M.D.   On: 11/22/2024 10:58   CT ANGIO HEAD NECK W WO  CM W PERF (CODE STROKE) Result Date: 11/22/2024 EXAM: CTA Head and Neck with Perfusion 11/22/2024 10:03:56 AM TECHNIQUE: CTA of the head and neck was performed without then with the administration of 100 mL iohexol  (OMNIPAQUE ) 350 MG/ML injection. 3D postprocessing with multiplanar reconstructions and MIPs was performed to evaluate the vascular anatomy. Cerebral perfusion analysis using computed tomography with contrast administration, including post-processing of parametric maps with determination of cerebral blood flow, cerebral blood volume, mean transit time and time-to-maximum. Automated exposure control, iterative reconstruction, and/or weight based adjustment of the mA/kV was utilized to reduce the radiation dose to as low as reasonably achievable. COMPARISON:  None available CLINICAL HISTORY: Neuro deficit, acute, stroke suspected. Acute neurologic deficit; stroke suspected. FINDINGS: CTA NECK: AORTIC ARCH AND ARCH VESSELS: No dissection or arterial injury. No significant stenosis of the brachiocephalic or subclavian arteries. CERVICAL CAROTID ARTERIES: There is mild stenosis of the proximal left cervical ICA in the setting of calcified and noncalcified plaque. The right cervical ICA demonstrates no dissection, arterial injury, or hemodynamically significant stenosis by NASCET criteria. CERVICAL VERTEBRAL ARTERIES: The right cervical vertebral artery is hypoplastic along its course and appears to terminate primarily as the right posterior inferior cerebellar artery. The left cervical vertebral artery demonstrates no dissection, arterial injury, or significant stenosis. LUNGS AND MEDIASTINUM: There is bilateral paraseptal pulmonary emphysema. SOFT TISSUES: No acute abnormality. BONES: No acute abnormality. CTA HEAD: ANTERIOR CIRCULATION: There is mild stenosis of the proximal right petrous ICA. The left intracranial ICA demonstrates no significant stenosis. Hypoplastic versus absent right A1 segment. The left A1 and A2 segments demonstrate no significant stenosis. The middle cerebral arteries demonstrate no significant stenosis. No aneurysm. POSTERIOR CIRCULATION: There is a right posterior communicating artery. The left posterior cerebral artery demonstrates no significant stenosis. The basilar artery demonstrates no significant stenosis. The distal right intracranial vertebral artery is significantly hypoplastic versus stenotic. The left intracranial vertebral artery demonstrates no significant stenosis. No aneurysm. OTHER: No dural venous sinus thrombosis on this non-dedicated study. CT PERFUSION: EXAM QUALITY: Exam quality is adequate with diagnostic perfusion maps. No significant motion artifact. Appropriate arterial inflow and venous outflow curves. CORE INFARCT  (CBF<30% volume): 0 mL TOTAL HYPOPERFUSION (Tmax>6s volume): 0 mL PENUMBRA: Mismatch volume: 0 mL Mismatch ratio: not applicable Location: not applicable IMPRESSION: 1. No acute large vessel occlusion, hemodynamically significant stenosis, or CT perfusion evidence of ischemia. 2. These results were communicated to Dr. Arora at 10:14 AM on 11/22/2024 by text page via the [facility] messaging system. 3. Mild stenosis of the proximal right petrous ICA and proximal left cervical ICA in the setting of calcified and noncalcified plaque. 4. Hypoplastic right cervical vertebral artery terminating primarily as the right PICA, with significantly hypoplastic versus stenotic distal right intracranial vertebral artery. 5. Pulmonary emphysema is an independent risk factor for Jefferson cancer. Recommend consideration for evaluation for a low-dose CT Jefferson cancer screening program. Electronically signed by: Prentice Spade MD 11/22/2024 10:40 AM EST RP Workstation: [id number]   CT HEAD CODE STROKE WO CONTRAST (LKW 0-4.5h, LVO 0-24h) Result Date: 11/22/2024 EXAM: CT HEAD WITHOUT CONTRAST 11/22/2024 09:55:13 AM TECHNIQUE: CT of the head was performed without the administration of intravenous contrast. Automated exposure control, iterative reconstruction, and/or weight based adjustment of the mA/kV was utilized to reduce the radiation dose to as low as reasonably achievable. COMPARISON: CT head 02/01/2011. CLINICAL HISTORY: Neuro deficit, acute, stroke suspected. Acute neurologic deficit; stroke suspected. FINDINGS: BRAIN AND VENTRICLES: No acute intracranial hemorrhage. 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  Age-indeterminate interval small infarction right thalamus. Mild parenchymal volume loss progressed since prior. There is mild scattered white matter hypodensities which are nonspecific but most commonly represent chronic microvascular ischemic changes. No evidence of acute territorial infarct. No hydrocephalus.  No extra-axial collection. No mass effect or midline shift. ORBITS: No acute abnormality. SINUSES: No acute abnormality. SOFT TISSUES AND SKULL: No acute soft tissue abnormality. No skull fracture. [place] Stroke Program Early CT Score (ASPECTS): 10 IMPRESSION: 1. No acute intracranial hemorrhage or acute territorial infarction. 2. ASPECTS 10. 3. Age-indeterminate small infarction in the right thalamus. MRI would be useful to further assess. 4. These results were communicated to Dr. Voncile at 10:10 on November 22, 2024 by text page via the [facility] messaging system. Electronically signed by: Prentice Spade MD 11/22/2024 10:11 AM EST RP Workstation: [id number]    Alm Schneider, DO  [facility]  If 7PM-7AM, please contact night-coverage [email or web address] Password [email or web address] 11/24/2024, 6:16 PM   LOS: 1 day   "

## 2024-11-24 NOTE — Progress Notes (Signed)
 Physical Therapy Treatment Patient Details Name: Jorge Jefferson MRN: [id number] DOB: 10-03-1946 Today's Date: 11/24/2024   History of Present Illness Jorge Jefferson is a 79 year old male with a history of diabetes mellitus type 2, hypertension, hyperlipidemia, hepatic fibrosis, tobacco abuse presenting with dysarthria and right arm and right leg weakness when he woke up at 8 AM on 11/22/2024.  The patient went to bed in his usual state of health around 12:30 AM on 11/22/2024.  Because of his deficits, EMS was activated and code stroke was called.  The patient was seen by neurology in the emergency department and he was deemed not to be a tPA candidate due to being outside the window of time.  Patient denies fevers, chills, headache, chest pain, dyspnea, nausea, vomiting, diarrhea, abdominal pain, dysuria, hematuria, hematochezia, and melena.    PT Comments  Patient agreeable and motivated for therapy. Patient demonstrates slow labored movement for sitting up at bedside with no use of RUE and limited use of RLE requiring Max assist for sitting up, once seated initially had frequent leaning to the right, but after instructed to cross arms (holding RUJE using LUE) patient able to maintain sitting balance and sway body left/right, front/back without loss of balance due to fair/good trunk control. Patient able to stand using LUE on walker while right knee blocked for up to 2-3 minutes before having to sit due to fatigue. Patient demonstrates good return for using his LUE and LLE to help scoot self up in bed to reposition self. Patient's family present during therapy session and very supportive. Patient will benefit from continued skilled physical therapy in hospital and recommended venue below to increase strength, balance, endurance for safe ADLs and gait.      If plan is discharge home, recommend the following: A lot of help with bathing/dressing/bathroom;A lot of help with walking and/or transfers;Assist  for transportation;Assistance with cooking/housework;Help with stairs or ramp for entrance   Can travel by private vehicle        Equipment Recommendations  None recommended by PT    Recommendations for Other Services       Precautions / Restrictions Precautions Precautions: Fall Recall of Precautions/Restrictions: Intact Restrictions Weight Bearing Restrictions Per Provider Order: No     Mobility  Bed Mobility Overal bed mobility: Needs Assistance Bed Mobility: Supine to Sit, Sit to Supine     Supine to sit: Max assist Sit to supine: Mod assist   General bed mobility comments: slow labored movement with difficulty moving RUE, RLE    Transfers Overall transfer level: Needs assistance Equipment used: Rolling walker (2 wheels), 1 person hand held assist Transfers: Sit to/from Stand Sit to Stand: Max assist           General transfer comment: Able to stand using LUE on RW and right knee blocked    Ambulation/Gait                   Stairs             Wheelchair Mobility     Tilt Bed    Modified Rankin (Stroke Patients Only)       Balance Overall balance assessment: Needs assistance Sitting-balance support: Feet supported, No upper extremity supported Sitting balance-Leahy Scale: Fair Sitting balance - Comments: seated at EOB   Standing balance support: Reliant on assistive device for balance, Single extremity supported Standing balance-Leahy Scale: Poor Standing balance comment: using left hand on RW and right knee blocked  Communication Communication Communication: Impaired Factors Affecting Communication: Reduced clarity of speech  Cognition Arousal: Alert Behavior During Therapy: WFL for tasks assessed/performed                             Following commands: Intact      Cueing Cueing Techniques: Verbal cues, Tactile cues  Exercises General Exercises - Lower Extremity Long  Arc Quad: PROM, Seated, AAROM, Strengthening, 10 reps, Right Hip Flexion/Marching: PROM, AAROM, Seated, Strengthening, 10 reps, Right    General Comments        Pertinent Vitals/Pain Pain Assessment Pain Assessment: No/denies pain    Home Living                          Prior Function            PT Goals (current goals can now be found in the care plan section) Acute Rehab PT Goals Patient Stated Goal: to regain strength before returning home PT Goal Formulation: With patient/family Time For Goal Achievement: 12/21/24 Potential to Achieve Goals: Good Progress towards PT goals: Progressing toward goals    Frequency    Min 4X/week      PT Plan      Co-evaluation              AM-PAC PT 6 Clicks Mobility   Outcome Measure  Help needed turning from your back to your side while in a flat bed without using bedrails?: A Lot Help needed moving from lying on your back to sitting on the side of a flat bed without using bedrails?: A Lot Help needed moving to and from a bed to a chair (including a wheelchair)?: A Lot Help needed standing up from a chair using your arms (e.g., wheelchair or bedside chair)?: A Lot Help needed to walk in hospital room?: Total Help needed climbing 3-5 steps with a railing? : Total 6 Click Score: 10    End of Session   Activity Tolerance: Patient tolerated treatment well;Patient limited by fatigue Patient left: in bed;with call bell/phone within reach;with family/visitor present Nurse Communication: Mobility status PT Visit Diagnosis: Unsteadiness on feet (R26.81);Other abnormalities of gait and mobility (R26.89);Muscle weakness (generalized) (M62.81);Difficulty in walking, not elsewhere classified (R26.2);Hemiplegia and hemiparesis Hemiplegia - Right/Left: Right Hemiplegia - dominant/non-dominant: Dominant     Time: 8464-8397 PT Time Calculation (min) (ACUTE ONLY): 27 min  Charges:    $Therapeutic Exercise: 8-22  mins $Therapeutic Activity: 8-22 mins PT General Charges $$ ACUTE PT VISIT: 1 Visit                     4:21 PM, 11/24/24 Lynwood Music, MPT Physical Therapist with [facility] [phone] office [phone] mobile phone

## 2024-11-24 NOTE — Progress Notes (Signed)
 Inpatient Rehab Admissions Coordinator:   I spoke with Pt. And wife regarding potential [facility] admit. They are interested, wife can provide 24/7 assist at dc. I will send case to insurance and pursue for admit.   Leita Kleine, MS, CCC-SLP Rehab Admissions Coordinator  [phone] (celll) [phone] (office)

## 2024-11-24 NOTE — Care Management Important Message (Signed)
 Important Message  Patient Details  Name: Jorge Jefferson MRN: [id number] Date of Birth: 1946/07/01   Important Message Given:  Yes - Medicare IM     Reyna Lorenzi L Glynis Hunsucker 11/24/2024, 3:36 PM

## 2024-11-24 NOTE — Plan of Care (Signed)
  Problem: Activity: Goal: Risk for activity intolerance will decrease Outcome: Progressing   Problem: Safety: Goal: Ability to remain free from injury will improve Outcome: Progressing   

## 2024-11-24 NOTE — Progress Notes (Signed)
 Physical Medicine & Rehabilitation Consult Service  Pt discussed with rehab admissions coordinator. Chart has been reviewed. This is a 79 year old male with diabetes hypertension hepatic fibrosis and tobacco use who presented on 11/22/2024 with right-sided weakness and dysarthria.  CT of the head demonstrated aged indeterminant right thalamic infarct.  CTA of the head and neck was negative for large vessel disease.  MRI of the brain ultimately revealed a left 11 mm thalamic infarct.  Neurology was consulted and felt the etiology was likely small vessel disease versus embolic source.  Recommended aspirin  81 mg daily with Plavix  for 3 months then aspirin  alone.  Patient has been up with therapies and functional levels are noted below.  He was modified independent prior to arrival and lives with his spouse in a 1 level apartment.  Home: Home Living Family/patient expects to be discharged to:: Private residence Living Arrangements: Spouse/significant other Available Help at Discharge: Family, Available 24 hours/day Type of Home: Apartment Home Access: Level entry Home Layout: One level Bathroom Shower/Tub: Engineer, Manufacturing Systems: Standard Bathroom Accessibility:  (maybe RW) Home Equipment: Cane - single point  Lives With: Spouse  Functional History: Prior Function Prior Level of Function : Independent/Modified Independent Mobility Comments: Community ambulator without AD. ADLs Comments: Independent Functional Status:  Mobility: Bed Mobility Overal bed mobility: Needs Assistance Bed Mobility: Supine to Sit, Sit to Supine Supine to sit: Max assist Sit to supine: Max assist General bed mobility comments: R lateral lean; much assist for trunk control. Transfers Overall transfer level: Needs assistance Equipment used: Rolling walker (2 wheels) Transfers: Sit to/from Stand Sit to Stand: Max assist, +2 physical assistance General transfer comment: PT and OT assist with RW used only in  L UE for sit to stand from EOB. R LE buckling if not locked in place by PT. Ambulation/Gait Ambulation/Gait assistance:  (although attemtpted pts deficits on RLE would not allow for ambulation this date, R knee consistently buckling, therapist max assist for safety) Gait Distance (Feet): 0 Feet    ADL: ADL Overall ADL's : Needs assistance/impaired Eating/Feeding: Set up, Sitting Grooming: Moderate assistance, Sitting, Minimal assistance Upper Body Bathing: Moderate assistance, Sitting Lower Body Bathing: Maximal assistance, Total assistance, Bed level, Sitting/lateral leans Upper Body Dressing : Moderate assistance, Sitting Lower Body Dressing: Maximal assistance, Total assistance, Bed level, Sitting/lateral leans Toilet Transfer: Maximal assistance, +2 for physical assistance, Rolling walker (2 wheels), Total assistance Toilet Transfer Details (indicate cue type and reason): Partially simulated via sit to stand from EOB. Toileting- Clothing Manipulation and Hygiene: Moderate assistance, Maximal assistance, Bed level, Sitting/lateral lean Tub/ Shower Transfer: Maximal assistance, Total assistance, +2 for physical assistance, Rolling walker (2 wheels), Tub bench Functional mobility during ADLs: Maximal assistance, Total assistance, +2 for physical assistance, Rolling walker (2 wheels)  Cognition: Cognition Overall Cognitive Status: Impaired/Different from baseline Arousal/Alertness: Awake/alert Orientation Level: Oriented X4 Attention: Sustained Sustained Attention: Impaired Sustained Attention Impairment: Verbal basic, Functional basic Awareness: Impaired Awareness Impairment: Anticipatory impairment Problem Solving: Impaired Problem Solving Impairment: Verbal basic, Functional basic, Other (comment) (using call bell for A) Behaviors: Impulsive, Restless Safety/Judgment: Impaired Cognition Arousal: Alert Behavior During Therapy: WFL for tasks assessed/performed Overall Cognitive  Status: Impaired/Different from baseline   Assessment: 79 year old male with left thalamic infarct CKD stage III AA History of PAD Hypertension Obesity   Plan:  This patient would benefit from acute inpatient rehab to address functional mobility and self-care as well as speech and cognition. Additionally, the patient requires daily MD oversight of the active medical issues  noted above. Projected goals would be supervision to min assist with mobility and self-care and supervision with speech and cognition with an ELOS of 15 to 20 days.  Dispo and social supports are appropriate.    Rehab Admissions Coordinator to follow up.    Arthea IVAR Gunther, MD, [facility] Physical Medicine & Rehabilitation Medical Director Rehabilitation Services 11/24/2024

## 2024-11-24 NOTE — Plan of Care (Signed)

## 2025-01-09 ENCOUNTER — Ambulatory Visit: Admitting: Podiatry
